# Patient Record
Sex: Female | Born: 1955 | Race: Black or African American | Hispanic: No | Marital: Single | Smoking: Never smoker
Health system: Southern US, Community
[De-identification: ages and names within clinical notes are randomized; demographics above are authoritative.]

## PROBLEM LIST (undated history)

## (undated) ENCOUNTER — Emergency Department (HOSPITAL_COMMUNITY): Admission: EM | Payer: Federal, State, Local not specified - PPO | Source: Home / Self Care

## (undated) DIAGNOSIS — J45909 Unspecified asthma, uncomplicated: Secondary | ICD-10-CM

## (undated) DIAGNOSIS — K219 Gastro-esophageal reflux disease without esophagitis: Secondary | ICD-10-CM

## (undated) DIAGNOSIS — E7800 Pure hypercholesterolemia, unspecified: Secondary | ICD-10-CM

## (undated) DIAGNOSIS — I10 Essential (primary) hypertension: Secondary | ICD-10-CM

## (undated) DIAGNOSIS — E785 Hyperlipidemia, unspecified: Secondary | ICD-10-CM

## (undated) DIAGNOSIS — M810 Age-related osteoporosis without current pathological fracture: Secondary | ICD-10-CM

## (undated) HISTORY — PX: ABDOMINAL HYSTERECTOMY: SHX81

## (undated) HISTORY — DX: Age-related osteoporosis without current pathological fracture: M81.0

## (undated) HISTORY — DX: Hyperlipidemia, unspecified: E78.5

## (undated) HISTORY — DX: Essential (primary) hypertension: I10

## (undated) HISTORY — DX: Gastro-esophageal reflux disease without esophagitis: K21.9

## (undated) HISTORY — PX: BREAST BIOPSY: SHX20

## (undated) NOTE — *Deleted (*Deleted)
Md on call paged and notified of pt c/o epigastric pain.

---
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
## 2012-05-25 HISTORY — PX: OTHER SURGICAL HISTORY: SHX169

## 2016-06-18 LAB — HM COLONOSCOPY

## 2017-03-08 LAB — HM MAMMOGRAPHY

## 2018-03-14 DIAGNOSIS — Z01419 Encounter for gynecological examination (general) (routine) without abnormal findings: Secondary | ICD-10-CM | POA: Diagnosis not present

## 2018-03-14 LAB — HM PAP SMEAR: HM Pap smear: NEGATIVE

## 2018-10-11 LAB — CBC AND DIFFERENTIAL
HCT: 35 — AB (ref 36–46)
Hemoglobin: 12.1 (ref 12.0–16.0)
Neutrophils Absolute: 2
Platelets: 327 (ref 150–399)
WBC: 4.1

## 2018-10-13 LAB — HEPATIC FUNCTION PANEL
ALT: 20 (ref 7–35)
AST: 20 (ref 13–35)
Alkaline Phosphatase: 39 (ref 25–125)
Bilirubin, Total: 0.5

## 2018-10-13 LAB — IFE AND PE, SERUM
Albumin: 4.3 (ref ?–4.4)
Alpha-1-Globulin: 0.2 (ref ?–0.4)
Alpha-2-Globulin: 0.7 (ref ?–1)
Beta Globulin: 0.9 (ref ?–1.3)
Gamma Globulin: 0.8 (ref ?–1.8)
Globulin, Total: 2.6 (ref 2.2–3.9)
IgA, Qn, Serum: 124 (ref ?–354)
IgG, Qn, Serum: 921 (ref ?–1602)
IgM, Qn, Serum: 91 (ref ?–217)
Immunofixation Interp.,Sr: NORMAL

## 2018-10-13 LAB — PTH, INTACT
PTH, Intact: 32 (ref 15–65)
Phosphorus: 3.3 (ref 3–4.3)

## 2018-10-13 LAB — IFE AND PE, RANDOM URINE
ALBUMIN, U: 37.1
ALPHA-2-GLOBULIN, U: 21
Alpha-1-Globulin, U: 5.3
Beta Globulin, U: 22.7
Gamma Globulin, U: 13.9
Immunofixation Result, Urine: NORMAL
Protein, Ur: 10.1

## 2018-10-13 LAB — BASIC METABOLIC PANEL
BUN: 17 (ref 4–21)
Creatinine: 1.1 (ref 0.5–1.1)
Glucose: 87
Potassium: 4.5 (ref 3.4–5.3)
Sodium: 139 (ref 137–147)

## 2019-01-27 DIAGNOSIS — H35413 Lattice degeneration of retina, bilateral: Secondary | ICD-10-CM | POA: Diagnosis not present

## 2019-01-27 DIAGNOSIS — H33313 Horseshoe tear of retina without detachment, bilateral: Secondary | ICD-10-CM | POA: Diagnosis not present

## 2019-01-27 DIAGNOSIS — H43393 Other vitreous opacities, bilateral: Secondary | ICD-10-CM | POA: Diagnosis not present

## 2019-01-27 DIAGNOSIS — H43813 Vitreous degeneration, bilateral: Secondary | ICD-10-CM | POA: Diagnosis not present

## 2019-02-07 DIAGNOSIS — H33311 Horseshoe tear of retina without detachment, right eye: Secondary | ICD-10-CM | POA: Diagnosis not present

## 2019-03-01 ENCOUNTER — Ambulatory Visit (INDEPENDENT_AMBULATORY_CARE_PROVIDER_SITE_OTHER): Payer: Federal, State, Local not specified - PPO | Admitting: Family Medicine

## 2019-03-01 ENCOUNTER — Encounter: Payer: Self-pay | Admitting: Family Medicine

## 2019-03-01 ENCOUNTER — Other Ambulatory Visit: Payer: Self-pay

## 2019-03-01 VITALS — BP 141/83 | HR 69 | Temp 99.0°F | Resp 16 | Ht 64.0 in | Wt 168.6 lb

## 2019-03-01 DIAGNOSIS — Z23 Encounter for immunization: Secondary | ICD-10-CM | POA: Diagnosis not present

## 2019-03-01 DIAGNOSIS — Z124 Encounter for screening for malignant neoplasm of cervix: Secondary | ICD-10-CM

## 2019-03-01 DIAGNOSIS — I10 Essential (primary) hypertension: Secondary | ICD-10-CM

## 2019-03-01 DIAGNOSIS — Z1231 Encounter for screening mammogram for malignant neoplasm of breast: Secondary | ICD-10-CM

## 2019-03-01 NOTE — Progress Notes (Signed)
Subjective:    Patient ID: Kendra Allison, female    DOB: [birth year], 63 y.o.   MRN: [id number]  Kendra Allison is a 63 y.o. female presenting on 03/01/2019 for New Patient (Initial Visit)  Moved from [place] 2 months ago, she retired early due to stress.  HPI   CHRONIC HTN: Reports history prior readings due to stress in past 160s with prior job. now improved and moved here retired. Home readings SBP 130s. Current Meds - Spironolactone 25mg  daily, Verapamil 240mg  CR - Previously followed by Cardiology asking about dosing, says she has side effects on Arlyce Harman would like to hold off of this, improved off of med. Reports good compliance, took meds today. Tolerating well, w/o complaints. Denies CP, dyspnea, HA, edema, dizziness / lightheadedness  Acid Reflux / Abdominal Discomfort Chronic problem. Not on PPI currently. Worse with side effect on Spiro.   Health Maintenance:  Request referral to GYN for pap smear. She previously followed by GYN - will request records.  Colon CA Screening - prior GI in [place]. Had Colonoscopy done 4-5 years ago approx, she will request records for [place]. She is considering Cologuard as alternative if due.  Breast CA Screening: Due for mammogram screening. Last mammogram result approx 1 year ago, request referral to GYN.    Depression screen PHQ 2/9 03/01/2019  Decreased Interest 0  Down, Depressed, Hopeless 0  PHQ - 2 Score 0  Altered sleeping 0  Tired, decreased energy 3  Change in appetite 0  Feeling bad or failure about yourself  0  Trouble concentrating 0  Moving slowly or fidgety/restless 0  Suicidal thoughts 0  PHQ-9 Score 3  Difficult doing work/chores Not difficult at all    Past Medical History:  Diagnosis Date  . GERD (gastroesophageal reflux disease)   . Hyperlipidemia   . Hypertension   . Osteoporosis    History reviewed. No pertinent surgical history. Social History   Socioeconomic History  . Marital status: Single   Spouse name: Not on file  . Number of children: Not on file  . Years of education: Not on file  . Highest education level: Not on file  Occupational History  . Not on file  Social Needs  . Financial resource strain: Not on file  . Food insecurity    Worry: Not on file    Inability: Not on file  . Transportation needs    Medical: Not on file    Non-medical: Not on file  Tobacco Use  . Smoking status: Never Smoker  . Smokeless tobacco: Never Used  Substance and Sexual Activity  . Alcohol use: Yes  . Drug use: Never  . Sexual activity: Not on file  Lifestyle  . Physical activity    Days per week: Not on file    Minutes per session: Not on file  . Stress: Not on file  Relationships  . Social Herbalist on phone: Not on file    Gets together: Not on file    Attends religious service: Not on file    Active member of club or organization: Not on file    Attends meetings of clubs or organizations: Not on file    Relationship status: Not on file  . Intimate partner violence    Fear of current or ex partner: Not on file    Emotionally abused: Not on file    Physically abused: Not on file    Forced sexual activity: Not on file  Other  Topics Concern  . Not on file  Social History Narrative  . Not on file   History reviewed. No pertinent family history. Current Outpatient Medications on File Prior to Visit  Medication Sig  . alendronate (FOSAMAX) 70 MG tablet Take 70 mg by mouth once a week. Take with a full glass of water on an empty stomach.  . Azelastine-Fluticasone 137-50 MCG/ACT SUSP Place into the nose.  . estradiol (ESTRACE) 2 MG tablet Take 2 mg by mouth daily.  . montelukast (SINGULAIR) 10 MG tablet Take 10 mg by mouth at bedtime.  . rosuvastatin (CRESTOR) 10 MG tablet Take 10 mg by mouth daily.  . verapamil (CALAN-SR) 240 MG CR tablet Take 240 mg by mouth at bedtime.   No current facility-administered medications on file prior to visit.     Review of  Systems Per HPI unless specifically indicated above       Objective:    BP (!) 141/83 (BP Location: Left Arm, Patient Position: Sitting, Cuff Size: Normal)   Pulse 69   Temp 99 F (37.2 C) (Oral)   Resp 16   Ht 5\' 4"  (1.626 m)   Wt 168 lb 9.6 oz (76.5 kg)   SpO2 100%   BMI 28.94 kg/m   Wt Readings from Last 3 Encounters:  03/01/19 168 lb 9.6 oz (76.5 kg)    Physical Exam Vitals signs and nursing note reviewed.  Constitutional:      General: She is not in acute distress.    Appearance: She is well-developed. She is not diaphoretic.     Comments: Well-appearing, comfortable, cooperative  HENT:     Head: Normocephalic and atraumatic.  Eyes:     General:        Right eye: No discharge.        Left eye: No discharge.     Conjunctiva/sclera: Conjunctivae normal.  Cardiovascular:     Rate and Rhythm: Normal rate.  Pulmonary:     Effort: Pulmonary effort is normal.  Skin:    General: Skin is warm and dry.     Findings: No erythema or rash.  Neurological:     Mental Status: She is alert and oriented to person, place, and time.  Psychiatric:        Behavior: Behavior normal.     Comments: Well groomed, good eye contact, normal speech and thoughts    No results found for this or any previous visit.    Assessment & Plan:   Problem List Items Addressed This Visit    Essential hypertension - Primary Mild elevated BP, improved Side effect on Spiro rx by prior Cardiology  May HOLD Spiro, since limited edema, was on for BP Failed prior BP meds - unsure list, request records Continue Verapamil for now Review prior record and consider future other BP med vs refer to Cards if indicated   Relevant Medications   verapamil (CALAN-SR) 240 MG CR tablet   rosuvastatin (CRESTOR) 10 MG tablet    Other Visit Diagnoses    Need for influenza vaccination       Relevant Orders   Flu Vaccine QUAD 36+ mos IM (Completed)   Cervical cancer screening       Relevant Orders    Ambulatory referral to Obstetrics / Gynecology   Encounter for screening mammogram for malignant neoplasm of breast       Relevant Orders   Ambulatory referral to Obstetrics / Gynecology      Referral to GYN [facility] vs [facility]  for further screening pap smear / mammogram.  #Establish Care - request all records from prior specialist.  No orders of the defined types were placed in this encounter.     Follow up plan: Return in about 3 weeks (around 03/22/2019) for Annual Physical.   Future labs 03/16/19 CMET , CBC, Lipid, A1c, TSH, Hep C,  Nobie Putnam, DO [facility] 03/01/2019, 10:23 AM

## 2019-03-01 NOTE — Patient Instructions (Addendum)
Thank you for coming to the office today.  Try to locate all of your previous health records, mainly need any lab tests, Mammogram, Colonoscopy, Pap Smear testing - and we can review that, drop it off and we can make copies.  Referral to GYN women's health - one of these locations will call you to schedule a new patient appointment for pap smear and mammogram and screening evaluation.  [facility] [address] [place], [place] [address] Hours: [place] Main: [place]   Address: [address] [place], [place], [place], [place] [address] Hours: 8AM-5PM Phone: [phone]  For Mammogram screening for breast cancer   ---------------------------------------------------------------------  AFTER you talk to GYN then you can call the [place] below anytime  [facility] [place],  [address] Phone: [phone]  -------------------------------------  Discontinue the Spironolactone for now - keep track of blood pressure and swelling, we can discuss alternative meds at next visit once I review your records.    DUE for FASTING BLOOD WORK (no food or drink after midnight before the lab appointment, only water or coffee without cream/sugar on the morning of)  SCHEDULE "Lab Only" visit in the morning at the clinic for lab draw in 2 WEEKS   - Make sure Lab Only appointment is at about 1 week before your next appointment, so that results will be available  For Lab Results, once available within 2-3 days of blood draw, you can can log in to MyChart online to view your results and a brief explanation. Also, we can discuss results at next follow-up visit.   Please schedule a Follow-up Appointment to: Return in about 3 weeks (around 03/22/2019) for Annual Physical.  If you have any other questions or concerns, please feel free to call the office or send  a message through [place]. You may also schedule an earlier appointment if necessary.  Additionally, you may be receiving a survey about your experience at our office within a few days to 1 week by e-mail or mail. We value your feedback.  Nobie Putnam, DO [place]

## 2019-03-02 ENCOUNTER — Other Ambulatory Visit: Payer: Self-pay | Admitting: Family Medicine

## 2019-03-02 DIAGNOSIS — I10 Essential (primary) hypertension: Secondary | ICD-10-CM

## 2019-03-02 DIAGNOSIS — Z0000 Encounter for general adult medical examination without abnormal findings: Secondary | ICD-10-CM

## 2019-03-02 DIAGNOSIS — Z1159 Encounter for screening for other viral diseases: Secondary | ICD-10-CM

## 2019-03-08 DIAGNOSIS — H33313 Horseshoe tear of retina without detachment, bilateral: Secondary | ICD-10-CM | POA: Diagnosis not present

## 2019-03-09 ENCOUNTER — Encounter: Payer: Self-pay | Admitting: Family Medicine

## 2019-03-09 DIAGNOSIS — I517 Cardiomegaly: Secondary | ICD-10-CM | POA: Insufficient documentation

## 2019-03-09 HISTORY — DX: Cardiomegaly: I51.7

## 2019-03-16 ENCOUNTER — Other Ambulatory Visit: Payer: Self-pay

## 2019-03-16 DIAGNOSIS — I10 Essential (primary) hypertension: Secondary | ICD-10-CM | POA: Diagnosis not present

## 2019-03-16 DIAGNOSIS — Z1159 Encounter for screening for other viral diseases: Secondary | ICD-10-CM | POA: Diagnosis not present

## 2019-03-16 DIAGNOSIS — E785 Hyperlipidemia, unspecified: Secondary | ICD-10-CM | POA: Diagnosis not present

## 2019-03-16 DIAGNOSIS — Z0000 Encounter for general adult medical examination without abnormal findings: Secondary | ICD-10-CM | POA: Diagnosis not present

## 2019-03-20 LAB — COMPLETE METABOLIC PANEL WITH GFR
AG Ratio: 2 (calc) (ref 1.0–2.5)
ALT: 13 U/L (ref 6–29)
AST: 19 U/L (ref 10–35)
Albumin: 4.6 g/dL (ref 3.6–5.1)
Alkaline phosphatase (APISO): 34 U/L — ABNORMAL LOW (ref 37–153)
BUN: 21 mg/dL (ref 7–25)
CO2: 27 mmol/L (ref 20–32)
Calcium: 10.1 mg/dL (ref 8.6–10.4)
Chloride: 105 mmol/L (ref 98–110)
Creat: 0.96 mg/dL (ref 0.50–0.99)
GFR, Est African American: 73 mL/min/{1.73_m2} (ref >=60)
GFR, Est Non African American: 63 mL/min/{1.73_m2} (ref >=60)
Globulin: 2.3 g/dL (calc) (ref 1.9–3.7)
Glucose, Bld: 74 mg/dL (ref 65–99)
Potassium: 4.2 mmol/L (ref 3.5–5.3)
Sodium: 142 mmol/L (ref 135–146)
Total Bilirubin: 0.6 mg/dL (ref 0.2–1.2)
Total Protein: 6.9 g/dL (ref 6.1–8.1)

## 2019-03-20 LAB — LIPID PANEL
Cholesterol: 131 mg/dL (ref <200)
HDL: 64 mg/dL (ref >=50)
LDL Cholesterol (Calc): 54 mg/dL (calc)
Non-HDL Cholesterol (Calc): 67 mg/dL (calc) (ref <130)
Total CHOL/HDL Ratio: 2 (calc) (ref <5.0)
Triglycerides: 47 mg/dL (ref <150)

## 2019-03-20 LAB — CBC WITH DIFFERENTIAL/PLATELET
Absolute Monocytes: 338 cells/uL (ref 200–950)
Basophils Absolute: 49 cells/uL (ref 0–200)
Basophils Relative: 1.3 %
Eosinophils Absolute: 99 cells/uL (ref 15–500)
Eosinophils Relative: 2.6 %
HCT: 35.5 % (ref 35.0–45.0)
Hemoglobin: 11.7 g/dL (ref 11.7–15.5)
Lymphs Abs: 1493 cells/uL (ref 850–3900)
MCH: 30.5 pg (ref 27.0–33.0)
MCHC: 33 g/dL (ref 32.0–36.0)
MCV: 92.4 fL (ref 80.0–100.0)
MPV: 11.2 fL (ref 7.5–12.5)
Monocytes Relative: 8.9 %
Neutro Abs: 1820 cells/uL (ref 1500–7800)
Neutrophils Relative %: 47.9 %
Platelets: 282 10*3/uL (ref 140–400)
RBC: 3.84 10*6/uL (ref 3.80–5.10)
RDW: 12 % (ref 11.0–15.0)
Total Lymphocyte: 39.3 %
WBC: 3.8 10*3/uL (ref 3.8–10.8)

## 2019-03-20 LAB — HEPATITIS C ANTIBODY
Hepatitis C Ab: NONREACTIVE
SIGNAL TO CUT-OFF: 0 (ref <1.00)

## 2019-03-20 LAB — HEMOGLOBIN A1C
Hgb A1c MFr Bld: 5 % of total Hgb (ref <5.7)
Mean Plasma Glucose: 97 (calc)
eAG (mmol/L): 5.4 (calc)

## 2019-03-20 LAB — TSH: TSH: 2.51 mIU/L (ref 0.40–4.50)

## 2019-03-23 ENCOUNTER — Encounter: Payer: Self-pay | Admitting: Family Medicine

## 2019-03-23 ENCOUNTER — Ambulatory Visit (INDEPENDENT_AMBULATORY_CARE_PROVIDER_SITE_OTHER): Payer: Federal, State, Local not specified - PPO | Admitting: Family Medicine

## 2019-03-23 ENCOUNTER — Other Ambulatory Visit: Payer: Self-pay

## 2019-03-23 VITALS — BP 149/83 | HR 75 | Temp 99.3°F | Resp 16 | Ht 64.0 in | Wt 174.0 lb

## 2019-03-23 DIAGNOSIS — Z0000 Encounter for general adult medical examination without abnormal findings: Secondary | ICD-10-CM

## 2019-03-23 DIAGNOSIS — E7800 Pure hypercholesterolemia, unspecified: Secondary | ICD-10-CM

## 2019-03-23 DIAGNOSIS — E785 Hyperlipidemia, unspecified: Secondary | ICD-10-CM | POA: Insufficient documentation

## 2019-03-23 DIAGNOSIS — E782 Mixed hyperlipidemia: Secondary | ICD-10-CM | POA: Diagnosis not present

## 2019-03-23 DIAGNOSIS — R140 Abdominal distension (gaseous): Secondary | ICD-10-CM | POA: Diagnosis not present

## 2019-03-23 DIAGNOSIS — I10 Essential (primary) hypertension: Secondary | ICD-10-CM

## 2019-03-23 MED ORDER — ROSUVASTATIN CALCIUM 10 MG PO TABS: 10.0000 mg | ORAL_TABLET | Freq: Every day | ORAL | 3 refills | Status: DC

## 2019-03-23 MED ORDER — LOSARTAN POTASSIUM 50 MG PO TABS: 50.0000 mg | ORAL_TABLET | Freq: Every day | ORAL | 2 refills | Status: DC

## 2019-03-23 NOTE — Assessment & Plan Note (Signed)
Controlled cholesterol on statin and lifestyle Last lipid panel 02/2019  Plan: 1. Continue current meds - Rosuvastatin 10mg  daily refilled today 2. Encourage improved lifestyle - low carb/cholesterol, reduce portion size, continue improving regular exercise

## 2019-03-23 NOTE — Assessment & Plan Note (Addendum)
Mildly elevated initial BP, repeat manual check improved but still above goal SBP >140 - Home BP readings 0000000  No known complications    Plan:  1. ADD new med Losartan 50mg  daily - counseling on new start ACEi/ARB potential side effect risk, benefits, can repeat lab in future as anticipated 2. Continue Verapamil 240mg  daily 3. Encourage improved lifestyle - low sodium diet, regular exercise 4. Continue monitor BP outside office, bring readings to next visit, if persistently >140/90 or new symptoms notify office sooner  F/u 6 months, sooner if needed

## 2019-03-23 NOTE — Progress Notes (Addendum)
Subjective:    Patient ID: Kendra Allison, female    DOB: 03/27/1956, 63 y.o.   MRN: [id number]  Kendra Allison is a 63 y.o. female presenting on 03/23/2019 for Annual Exam   HPI   Here for Annual Physical and Lab Review.   CHRONIC HTN: Last visit we discontinued Spironolactone 25mg  due to some side effect. SHe has monitored home BP 140s on average. She did eat pork in AM and it raised her BP. Current Meds - Verapamil 240mg  CR Reports good compliance, took meds today. Tolerating well, w/o complaints. Denies CP, dyspnea, HA, edema, dizziness / lightheadedness  HYPERLIPIDEMIA: - Reports no concerns. Last lipid panel 02/2019, controlled  - Currently taking Rosuvastatin 10mg , tolerating well without side effects or myalgias Needs refill   Health Maintenance:  Request referral to GYN for pap smear. She previously followed by GYN - Last pap 2019. See results. She has apt with [facility]   Colon CA Screening - prior GI in [place]. Had Colonoscopy prior 2018, next due 10 years.  Breast CA Screening: Last mammogram 03/08/2017 - due for repeat, will go to GYN next.  Depression screen [facility] 2/9 03/23/2019 03/01/2019  Decreased Interest 0 0  Down, Depressed, Hopeless 0 0  PHQ - 2 Score 0 0  Altered sleeping 0 0  Tired, decreased energy 0 3  Change in appetite 0 0  Feeling bad or failure about yourself  0 0  Trouble concentrating 0 0  Moving slowly or fidgety/restless 0 0  Suicidal thoughts 0 0  PHQ-9 Score 0 3  Difficult doing work/chores Not difficult at all Not difficult at all    Past Medical History:  Diagnosis Date  . GERD (gastroesophageal reflux disease)   . Hyperlipidemia   . Hypertension   . Osteoporosis    History reviewed. No pertinent surgical history. Social History   Socioeconomic History  . Marital status: Single    Spouse name: Not on file  . Number of children: Not on file  . Years of education: Not on file  . Highest education level: Not on file   Occupational History  . Not on file  Social Needs  . Financial resource strain: Not on file  . Food insecurity    Worry: Not on file    Inability: Not on file  . Transportation needs    Medical: Not on file    Non-medical: Not on file  Tobacco Use  . Smoking status: Never Smoker  . Smokeless tobacco: Never Used  Substance and Sexual Activity  . Alcohol use: Yes  . Drug use: Never  . Sexual activity: Not on file  Lifestyle  . Physical activity    Days per week: Not on file    Minutes per session: Not on file  . Stress: Not on file  Relationships  . Social Herbalist on phone: Not on file    Gets together: Not on file    Attends religious service: Not on file    Active member of club or organization: Not on file    Attends meetings of clubs or organizations: Not on file    Relationship status: Not on file  . Intimate partner violence    Fear of current or ex partner: Not on file    Emotionally abused: Not on file    Physically abused: Not on file    Forced sexual activity: Not on file  Other Topics Concern  . Not on file  Social History Narrative  .  Not on file   History reviewed. No pertinent family history. Current Outpatient Medications on File Prior to Visit  Medication Sig  . alendronate (FOSAMAX) 70 MG tablet Take 70 mg by mouth once a week. Take with a full glass of water on an empty stomach.  . Azelastine-Fluticasone 137-50 MCG/ACT SUSP Place into the nose.  . estradiol (ESTRACE) 2 MG tablet Take 2 mg by mouth daily.  . montelukast (SINGULAIR) 10 MG tablet Take 10 mg by mouth at bedtime.  . verapamil (CALAN-SR) 240 MG CR tablet Take 240 mg by mouth at bedtime.   No current facility-administered medications on file prior to visit.     Review of Systems  Constitutional: Negative for activity change, appetite change, chills, diaphoresis, fatigue and fever.  HENT: Negative for congestion and hearing loss.   Eyes: Negative for visual disturbance.   Respiratory: Negative for apnea, cough, chest tightness, shortness of breath and wheezing.   Cardiovascular: Negative for chest pain, palpitations and leg swelling.  Gastrointestinal: Negative for abdominal pain, anal bleeding, blood in stool, constipation, diarrhea, nausea and vomiting.  Endocrine: Negative for cold intolerance.  Genitourinary: Negative for difficulty urinating, dysuria, frequency and hematuria.  Musculoskeletal: Negative for arthralgias, back pain and neck pain.  Skin: Negative for rash.  Allergic/Immunologic: Negative for environmental allergies.  Neurological: Negative for dizziness, weakness, light-headedness, numbness and headaches.  Hematological: Negative for adenopathy.  Psychiatric/Behavioral: Negative for behavioral problems, dysphoric mood and sleep disturbance. The patient is not nervous/anxious.    Per HPI unless specifically indicated above      Objective:    BP (!) 149/83   Pulse 75   Temp 99.3 F (37.4 C) (Oral)   Resp 16   Ht 5\' 4"  (1.626 m)   Wt 174 lb (78.9 kg)   BMI 29.87 kg/m   Wt Readings from Last 3 Encounters:  03/23/19 174 lb (78.9 kg)  03/01/19 168 lb 9.6 oz (76.5 kg)    Physical Exam Vitals signs and nursing note reviewed.  Constitutional:      General: She is not in acute distress.    Appearance: She is well-developed. She is not diaphoretic.     Comments: Well-appearing, comfortable, cooperative  HENT:     Head: Normocephalic and atraumatic.  Eyes:     General:        Right eye: No discharge.        Left eye: No discharge.     Conjunctiva/sclera: Conjunctivae normal.     Pupils: Pupils are equal, round, and reactive to light.  Neck:     Musculoskeletal: Normal range of motion and neck supple.     Thyroid: No thyromegaly.  Cardiovascular:     Rate and Rhythm: Normal rate and regular rhythm.     Heart sounds: Normal heart sounds. No murmur.  Pulmonary:     Effort: Pulmonary effort is normal. No respiratory distress.      Breath sounds: Normal breath sounds. No wheezing or rales.  Abdominal:     General: Bowel sounds are normal. There is no distension.     Palpations: Abdomen is soft. There is no mass.     Tenderness: There is no abdominal tenderness.  Musculoskeletal: Normal range of motion.        General: No tenderness.     Comments: Upper / Lower Extremities: - Normal muscle tone, strength bilateral upper extremities 5/5, lower extremities 5/5  Lymphadenopathy:     Cervical: No cervical adenopathy.  Skin:    General:  Skin is warm and dry.     Findings: No erythema or rash.  Neurological:     Mental Status: She is alert and oriented to person, place, and time.     Comments: Distal sensation intact to light touch all extremities  Psychiatric:        Behavior: Behavior normal.     Comments: Well groomed, good eye contact, normal speech and thoughts        Results for orders placed or performed in visit on 03/16/19  Hepatitis C antibody  Result Value Ref Range   Hepatitis C Ab NON-REACTIVE NON-REACTI   SIGNAL TO CUT-OFF 0.00 <1.00  TSH  Result Value Ref Range   TSH 2.51 0.40 - 4.50 mIU/L  Lipid panel  Result Value Ref Range   Cholesterol 131 <200 mg/dL   HDL 64 > OR = 50 mg/dL   Triglycerides 47 <150 mg/dL   LDL Cholesterol (Calc) 54 mg/dL (calc)   Total CHOL/HDL Ratio 2.0 <5.0 (calc)   Non-HDL Cholesterol (Calc) 67 <130 mg/dL (calc)  COMPLETE METABOLIC PANEL WITH GFR  Result Value Ref Range   Glucose, Bld 74 65 - 99 mg/dL   BUN 21 7 - 25 mg/dL   Creat 0.96 0.50 - 0.99 mg/dL   GFR, Est Non African American 63 > OR = 60 mL/min/1.15m2   GFR, Est African American 73 > OR = 60 mL/min/1.86m2   BUN/Creatinine Ratio NOT APPLICABLE 6 - 22 (calc)   Sodium 142 135 - 146 mmol/L   Potassium 4.2 3.5 - 5.3 mmol/L   Chloride 105 98 - 110 mmol/L   CO2 27 20 - 32 mmol/L   Calcium 10.1 8.6 - 10.4 mg/dL   Total Protein 6.9 6.1 - 8.1 g/dL   Albumin 4.6 3.6 - 5.1 g/dL   Globulin 2.3 1.9 - 3.7 g/dL  (calc)   AG Ratio 2.0 1.0 - 2.5 (calc)   Total Bilirubin 0.6 0.2 - 1.2 mg/dL   Alkaline phosphatase (APISO) 34 (L) 37 - 153 U/L   AST 19 10 - 35 U/L   ALT 13 6 - 29 U/L  CBC with Differential/Platelet  Result Value Ref Range   WBC 3.8 3.8 - 10.8 Thousand/uL   RBC 3.84 3.80 - 5.10 Million/uL   Hemoglobin 11.7 11.7 - 15.5 g/dL   HCT 35.5 35.0 - 45.0 %   MCV 92.4 80.0 - 100.0 fL   MCH 30.5 27.0 - 33.0 pg   MCHC 33.0 32.0 - 36.0 g/dL   RDW 12.0 11.0 - 15.0 %   Platelets 282 140 - 400 Thousand/uL   MPV 11.2 7.5 - 12.5 fL   Neutro Abs 1,820 1,500 - 7,800 cells/uL   Lymphs Abs 1,493 850 - 3,900 cells/uL   Absolute Monocytes 338 200 - 950 cells/uL   Eosinophils Absolute 99 15 - 500 cells/uL   Basophils Absolute 49 0 - 200 cells/uL   Neutrophils Relative % 47.9 %   Total Lymphocyte 39.3 %   Monocytes Relative 8.9 %   Eosinophils Relative 2.6 %   Basophils Relative 1.3 %  Hemoglobin A1c  Result Value Ref Range   Hgb A1c MFr Bld 5.0 <5.7 % of total Hgb   Mean Plasma Glucose 97 (calc)   eAG (mmol/L) 5.4 (calc)      Assessment & Plan:   Problem List Items Addressed This Visit    Hyperlipidemia    Controlled cholesterol on statin and lifestyle Last lipid panel 02/2019  Plan: 1. Continue current meds -  Rosuvastatin 10mg  daily refilled today 2. Encourage improved lifestyle - low carb/cholesterol, reduce portion size, continue improving regular exercise      Relevant Medications   rosuvastatin (CRESTOR) 10 MG tablet   losartan (COZAAR) 50 MG tablet   Essential hypertension    Mildly elevated initial BP, repeat manual check improved but still above goal SBP >140 - Home BP readings 0000000  No known complications    Plan:  1. ADD new med Losartan 50mg  daily - counseling on new start ACEi/ARB potential side effect risk, benefits, can repeat lab in future as anticipated 2. Continue Verapamil 240mg  daily 3. Encourage improved lifestyle - low sodium diet, regular exercise 4. Continue  monitor BP outside office, bring readings to next visit, if persistently >140/90 or new symptoms notify office sooner  F/u 6 months, sooner if needed      Relevant Medications   rosuvastatin (CRESTOR) 10 MG tablet   losartan (COZAAR) 50 MG tablet    Other Visit Diagnoses    Annual physical exam    -  Primary   Functional bloating        Reassuring history Diet modification, avoid trigger foods Trial on probiotic, sample given   Updated Health Maintenance information Reviewed recent lab results with patient Encouraged improvement to lifestyle with diet and exercise - Goal of weight loss   Meds ordered this encounter  Medications  . rosuvastatin (CRESTOR) 10 MG tablet    Sig: Take 1 tablet (10 mg total) by mouth daily.    Dispense:  90 tablet    Refill:  3  . losartan (COZAAR) 50 MG tablet    Sig: Take 1 tablet (50 mg total) by mouth daily.    Dispense:  30 tablet    Refill:  2     Follow up plan: Return in about 6 months (around 09/21/2019) for 6 month follow-up HTN.  Nobie Putnam, DO [facility] 03/23/2019, 10:01 AM

## 2019-03-23 NOTE — Patient Instructions (Addendum)
Thank you for coming to the office today.  03/30/2019 10:00 AM - [facility] - ask about Mammogram.  All blood work is excellent.   1. Chemistry - Normal results, including electrolytes, kidney and liver function. Normal fasting blood sugar   2. Hemoglobin A1c (Diabetes screening) - 5.0, normal not in range of Pre-Diabetes (>5.7 to 6.4)   3. TSH Thyroid Function Tests - Normal.   4. Cholesterol - Normal cholesterol results.   5. CBC Blood Counts - Normal, no anemia, other abnormality   Start Losartan 50mg  daily for BP - caution future risk of facial lip swelling if you get this, stop medicine and call.  Call if need to adjust dose or refill  Please schedule a Follow-up Appointment to: Return in about 6 months (around 09/21/2019) for 6 month follow-up HTN.  If you have any other questions or concerns, please feel free to call the office or send a message through [place]. You may also schedule an earlier appointment if necessary.  Additionally, you may be receiving a survey about your experience at our office within a few days to 1 week by e-mail or mail. We value your feedback.  Nobie Putnam, DO [place]

## 2019-03-24 ENCOUNTER — Encounter: Payer: Federal, State, Local not specified - PPO | Admitting: Certified Nurse Midwife

## 2019-03-30 ENCOUNTER — Encounter: Payer: Federal, State, Local not specified - PPO | Admitting: Certified Nurse Midwife

## 2019-05-10 ENCOUNTER — Telehealth: Payer: Self-pay

## 2019-05-10 ENCOUNTER — Other Ambulatory Visit: Payer: Self-pay

## 2019-05-10 ENCOUNTER — Encounter: Payer: Self-pay | Admitting: Obstetrics and Gynecology

## 2019-05-10 ENCOUNTER — Ambulatory Visit (INDEPENDENT_AMBULATORY_CARE_PROVIDER_SITE_OTHER): Payer: Federal, State, Local not specified - PPO | Admitting: Obstetrics and Gynecology

## 2019-05-10 VITALS — BP 186/109 | HR 73 | Ht 65.0 in | Wt 176.1 lb

## 2019-05-10 DIAGNOSIS — Z79890 Hormone replacement therapy (postmenopausal): Secondary | ICD-10-CM

## 2019-05-10 DIAGNOSIS — Z8739 Personal history of other diseases of the musculoskeletal system and connective tissue: Secondary | ICD-10-CM

## 2019-05-10 DIAGNOSIS — Z1231 Encounter for screening mammogram for malignant neoplasm of breast: Secondary | ICD-10-CM

## 2019-05-10 DIAGNOSIS — N898 Other specified noninflammatory disorders of vagina: Secondary | ICD-10-CM | POA: Diagnosis not present

## 2019-05-10 DIAGNOSIS — Z01419 Encounter for gynecological examination (general) (routine) without abnormal findings: Secondary | ICD-10-CM

## 2019-05-10 MED ORDER — ESTRADIOL 2 MG PO TABS: 2.0000 mg | ORAL_TABLET | Freq: Every day | ORAL | 3 refills | Status: DC

## 2019-05-10 MED ORDER — ALENDRONATE SODIUM 70 MG PO TABS: 70.0000 mg | ORAL_TABLET | ORAL | 3 refills | Status: DC

## 2019-05-10 NOTE — Progress Notes (Signed)
Patient comes in today as a new patient appointment. She is coming in for Pap and mammogram. Patient states that she is having some vaginal itching.

## 2019-05-10 NOTE — Progress Notes (Signed)
HPI:      [name] is a 63 y.o. No obstetric history on file. who LMP was No LMP recorded. Patient has had a hysterectomy.  Subjective:   She presents today for her annual examination.  She states that she recently had a yeast infection and she self medicated with Monistat as usual.  She is "not sure if it has gone away".  She would like to be checked today. Patient has been getting regular Pap smears and mammograms. Of significant note-patient has had a hysterectomy for bleeding issues in "1979".  She says that she had both of her ovaries removed at that time as well. Patient takes estrogen daily. She has a history of osteopenia and takes alendronate.  She is not currently using calcium or vitamin D.    Hx: The following portions of the patient's history were reviewed and updated as appropriate:             She  has a past medical history of GERD (gastroesophageal reflux disease), Hyperlipidemia, Hypertension, and Osteoporosis. She does not have any pertinent problems on file. She  has no past surgical history on file. Her family history is not on file. She  reports that she has quit smoking. Her smoking use included cigarettes. She has never used smokeless tobacco. She reports current alcohol use. She reports that she does not use drugs. She has a current medication list which includes the following prescription(s): azelastine-fluticasone, losartan, montelukast, rosuvastatin, verapamil, alendronate, and estradiol. She has No Known Allergies.       Review of Systems:  Review of Systems  Constitutional: Denied constitutional symptoms, night sweats, recent illness, fatigue, fever, insomnia and weight loss.  Eyes: Denied eye symptoms, eye pain, photophobia, vision change and visual disturbance.  Ears/Nose/Throat/Neck: Denied ear, nose, throat or neck symptoms, hearing loss, nasal discharge, sinus congestion and sore throat.  Cardiovascular: Denied cardiovascular symptoms,  arrhythmia, chest pain/pressure, edema, exercise intolerance, orthopnea and palpitations.  Respiratory: Denied pulmonary symptoms, asthma, pleuritic pain, productive sputum, cough, dyspnea and wheezing.  Gastrointestinal: Denied, gastro-esophageal reflux, melena, nausea and vomiting.  Genitourinary: See HPI for additional information.  Musculoskeletal: Denied musculoskeletal symptoms, stiffness, swelling, muscle weakness and myalgia.  Dermatologic: Denied dermatology symptoms, rash and scar.  Neurologic: Denied neurology symptoms, dizziness, headache, neck pain and syncope.  Psychiatric: Denied psychiatric symptoms, anxiety and depression.  Endocrine: Denied endocrine symptoms including hot flashes and night sweats.   Meds:   Current Outpatient Medications on File Prior to Visit  Medication Sig Dispense Refill  . Azelastine-Fluticasone 137-50 MCG/ACT SUSP Place into the nose.    . losartan (COZAAR) 50 MG tablet Take 1 tablet (50 mg total) by mouth daily. 30 tablet 2  . montelukast (SINGULAIR) 10 MG tablet Take 10 mg by mouth at bedtime.    . rosuvastatin (CRESTOR) 10 MG tablet Take 1 tablet (10 mg total) by mouth daily. 90 tablet 3  . verapamil (CALAN-SR) 240 MG CR tablet Take 240 mg by mouth at bedtime.     No current facility-administered medications on file prior to visit.    Objective:     Vitals:   05/10/19 0915  BP: (!) 186/109  Pulse: 73              Physical examination General NAD, Conversant  HEENT Atraumatic; Op clear with mmm.  Normo-cephalic. Pupils reactive. Anicteric sclerae  Thyroid/Neck Smooth without nodularity or enlargement. Normal ROM.  Neck Supple.  Skin No rashes, lesions or ulceration. Normal palpated skin  turgor. No nodularity.  Breasts: No masses or discharge.  Symmetric.  No axillary adenopathy.  Lungs: Clear to auscultation.No rales or wheezes. Normal Respiratory effort, no retractions.  Heart: NSR.  No murmurs or rubs appreciated. No periferal edema   Abdomen: Soft.  Non-tender.  No masses.  No HSM. No hernia  Extremities: Moves all appropriately.  Normal ROM for age. No lymphadenopathy.  Neuro: Oriented to PPT.  Normal mood. Normal affect.     Pelvic:   Vulva: Normal appearance.  No lesions.   Vagina: No lesions or abnormalities noted.  Support: Normal pelvic support.  Urethra No masses tenderness or scarring.  Meatus Normal size without lesions or prolapse.  Cervix: Surgically absent   Anus: Normal exam.  No lesions.  Perineum: Normal exam.  No lesions.        Bimanual   Uterus: Surgically absent   Adnexae: No masses.  Non-tender to palpation.  Cul-de-sac: Negative for abnormality.   WET PREP: clue cells: absent, KOH (yeast): negative, odor: absent and trichomoniasis: negative Ph:  < 4.5   Assessment:    No obstetric history on file. Patient Active Problem List   Diagnosis Date Noted  . Hyperlipidemia   . Mild concentric left ventricular hypertrophy (LVH) 03/09/2019  . Essential hypertension 03/01/2019     1. Well woman exam with routine gynecological exam   2. Vaginal discharge   3. History of osteopenia   4. Hormone replacement therapy (HRT)     No evidence of current vaginitis based on wet prep.  Patient surprised to learn that she does not have a cervix, which is not surprising to me status post hysterectomy.  History of osteopenia-patient states she had a DEXA scan 2 years ago.   Plan:            1.  Basic Screening Recommendations The basic screening recommendations for asymptomatic women were discussed with the patient during her visit.  The age-appropriate recommendations were discussed with her and the rational for the tests reviewed.  When I am informed by the patient that another primary care physician has previously obtained the age-appropriate tests and they are up-to-date, only outstanding tests are ordered and referrals given as necessary.  Abnormal results of tests will be discussed with her when  all of her results are completed.  Routine preventative health maintenance measures emphasized: Exercise/Diet/Weight control, Tobacco Warnings, Alcohol/Substance use risks and Stress Management Mammogram ordered. Recommend DEXA scan next year for follow-up of osteopenia 2.  Continue Estrace and alendronate 3.  Patient to contact us if she continues to experience symptomatic vaginal discharge. Orders No orders of the defined types were placed in this encounter.    Meds ordered this encounter  Medications  . alendronate (FOSAMAX) 70 MG tablet    Sig: Take 1 tablet (70 mg total) by mouth once a week. Take with a full glass of water on an empty stomach.    Dispense:  12 tablet    Refill:  3  . estradiol (ESTRACE) 2 MG tablet    Sig: Take 1 tablet (2 mg total) by mouth daily.    Dispense:  90 tablet    Refill:  3        F/U  Return in about 1 year (around 05/09/2020) for Annual Physical.  Finis Bud, M.D. 05/10/2019 10:25 AM

## 2019-05-10 NOTE — Telephone Encounter (Signed)
Pt request confirmation mammogram is ordered. Informed pt allow two days for order to be received at [facility].

## 2019-05-11 NOTE — Addendum Note (Signed)
Addended by: Durwin Glaze on: 05/11/2019 08:26 AM   Modules accepted: Orders

## 2019-05-11 NOTE — Telephone Encounter (Signed)
Order placed

## 2019-05-17 ENCOUNTER — Ambulatory Visit
Admission: RE | Admit: 2019-05-17 | Discharge: 2019-05-17 | Disposition: A | Discharge status: Home / Self Care | Payer: Federal, State, Local not specified - PPO | Source: Ambulatory Visit | Attending: Obstetrics and Gynecology | Admitting: Obstetrics and Gynecology

## 2019-05-17 DIAGNOSIS — Z1231 Encounter for screening mammogram for malignant neoplasm of breast: Secondary | ICD-10-CM | POA: Diagnosis not present

## 2019-05-22 ENCOUNTER — Other Ambulatory Visit: Payer: Self-pay | Admitting: Family Medicine

## 2019-05-22 DIAGNOSIS — I10 Essential (primary) hypertension: Secondary | ICD-10-CM

## 2019-05-22 MED ORDER — LOSARTAN POTASSIUM 50 MG PO TABS: 50.0000 mg | ORAL_TABLET | Freq: Every day | ORAL | 1 refills | Status: DC

## 2019-05-22 NOTE — Telephone Encounter (Signed)
Pt.called requesting refill on Losartan 50 MG called into  [facility] 90 day  Supply. Pt call back # [phone]

## 2019-06-12 ENCOUNTER — Other Ambulatory Visit: Payer: Self-pay

## 2019-06-12 ENCOUNTER — Encounter: Payer: Self-pay | Admitting: Family Medicine

## 2019-06-12 ENCOUNTER — Ambulatory Visit (INDEPENDENT_AMBULATORY_CARE_PROVIDER_SITE_OTHER): Payer: Federal, State, Local not specified - PPO | Admitting: Family Medicine

## 2019-06-12 DIAGNOSIS — R1013 Epigastric pain: Secondary | ICD-10-CM

## 2019-06-12 DIAGNOSIS — A048 Other specified bacterial intestinal infections: Secondary | ICD-10-CM

## 2019-06-12 DIAGNOSIS — R140 Abdominal distension (gaseous): Secondary | ICD-10-CM

## 2019-06-12 DIAGNOSIS — K219 Gastro-esophageal reflux disease without esophagitis: Secondary | ICD-10-CM

## 2019-06-12 MED ORDER — CLARITHROMYCIN 500 MG PO TABS: 500.0000 mg | ORAL_TABLET | Freq: Two times a day (BID) | ORAL | 0 refills | Status: DC

## 2019-06-12 MED ORDER — METRONIDAZOLE 500 MG PO TABS: 500.0000 mg | ORAL_TABLET | Freq: Three times a day (TID) | ORAL | 0 refills | Status: DC

## 2019-06-12 MED ORDER — OMEPRAZOLE 40 MG PO CPDR: DELAYED_RELEASE_CAPSULE | ORAL | 0 refills | Status: DC

## 2019-06-12 NOTE — Progress Notes (Signed)
Virtual Visit via Telephone The purpose of this virtual visit is to provide medical care while limiting exposure to the novel coronavirus (COVID19) for both patient and office staff.  Consent was obtained for phone visit:  Yes.   Answered questions that patient had about telehealth interaction:  Yes.   I discussed the limitations, risks, security and privacy concerns of performing an evaluation and management service by telephone. I also discussed with the patient that there may be a patient responsible charge related to this service. The patient expressed understanding and agreed to proceed.  Patient Location: Home Provider Location: [facility])  ---------------------------------------------------------------------- Chief Complaint  Patient presents with  . Abdominal Pain    abdominal pain happens after swallowing and the pain pushes up and make her chest hurt. Abdominal pain, diarrhea x 4 days , bloating , sour taste,gas and  nauseated x 20 days     S: Reviewed CMA documentation. I have called patient and gathered additional HPI as follows:   Abdominal Pain / GERD Reports that symptoms started >3 weeks ago, worse in past 4 days. Describes abdominal bloating, epigastric pain, acid reflux symptoms heartburn, dyspepsia. She has issue with symptoms following with reflux pain, upper gastric pain, some improvement with eating at times temporarily, she has had increased gas and bloating and burping. Admits sour taste in mouth and dyspepsia - Tried OTC Nexium and other antacids, taking Nexium 20mg  BID, Pepto  Known history of H Pylori up to 4 flares in past, last 1.5 year ago, has been treated with Flagyl + Clarithromycin Admits diarrhea, recurrent (onset 4 days ago, seems to be persistent)  Denies any high risk travel to areas of current concern for COVID19. Denies any known or suspected exposure to person with or possibly with COVID19.  Admits gas, bloating,  diarrhea, abdominal pain Denies any fevers, chills, sweats, body ache, cough, shortness of breath, sinus pain or pressure, headache   Past Medical History:  Diagnosis Date  . GERD (gastroesophageal reflux disease)   . Hyperlipidemia   . Hypertension   . Osteoporosis    Social History   Tobacco Use  . Smoking status: Former Smoker    Types: Cigarettes  . Smokeless tobacco: Never Used  . Tobacco comment: Quit 40years ago.   Substance Use Topics  . Alcohol use: Yes  . Drug use: Never    Current Outpatient Medications:  .  alendronate (FOSAMAX) 70 MG tablet, Take 1 tablet (70 mg total) by mouth once a week. Take with a full glass of water on an empty stomach., Disp: 12 tablet, Rfl: 3 .  Azelastine-Fluticasone 137-50 MCG/ACT SUSP, Place into the nose., Disp: , Rfl:  .  estradiol (ESTRACE) 2 MG tablet, Take 1 tablet (2 mg total) by mouth daily., Disp: 90 tablet, Rfl: 3 .  losartan (COZAAR) 50 MG tablet, Take 1 tablet (50 mg total) by mouth daily., Disp: 90 tablet, Rfl: 1 .  montelukast (SINGULAIR) 10 MG tablet, Take 10 mg by mouth at bedtime., Disp: , Rfl:  .  rosuvastatin (CRESTOR) 10 MG tablet, Take 1 tablet (10 mg total) by mouth daily., Disp: 90 tablet, Rfl: 3 .  verapamil (CALAN-SR) 240 MG CR tablet, Take 240 mg by mouth at bedtime., Disp: , Rfl:  .  clarithromycin (BIAXIN) 500 MG tablet, Take 1 tablet (500 mg total) by mouth 2 (two) times daily., Disp: 28 tablet, Rfl: 0 .  metroNIDAZOLE (FLAGYL) 500 MG tablet, Take 1 tablet (500 mg total) by mouth 3 (  three) times daily. Do not drink alcohol while taking this medicine., Disp: 42 tablet, Rfl: 0 .  omeprazole (PRILOSEC) 40 MG capsule, Take one pill 40mg  twice a day before meals for 2 weeks, then reduce to once daily, Disp: 42 capsule, Rfl: 0  Depression screen [facility] 2/9 03/23/2019 03/01/2019  Decreased Interest 0 0  Down, Depressed, Hopeless 0 0  PHQ - 2 Score 0 0  Altered sleeping 0 0  Tired, decreased energy 0 3  Change in  appetite 0 0  Feeling bad or failure about yourself  0 0  Trouble concentrating 0 0  Moving slowly or fidgety/restless 0 0  Suicidal thoughts 0 0  PHQ-9 Score 0 3  Difficult doing work/chores Not difficult at all Not difficult at all    No flowsheet data found.  -------------------------------------------------------------------------- O: No physical exam performed due to remote telephone encounter.  Lab results reviewed.  Recent Results (from the past 2160 hour(s))  Hepatitis C antibody     Status: None   Collection Time: 03/16/19  8:00 AM  Result Value Ref Range   Hepatitis C Ab NON-REACTIVE NON-REACTI   SIGNAL TO CUT-OFF 0.00 <1.00    Comment: . HCV antibody was non-reactive. There is no laboratory  evidence of HCV infection. . In most cases, no further action is required. However, if recent HCV exposure is suspected, a test for HCV RNA (test code [phone]) is suggested. . For additional information please refer to http://education.[facility].com/faq/FAQ22v1 (This link is being provided for informational/ educational purposes only.) .   TSH     Status: None   Collection Time: 03/16/19  8:00 AM  Result Value Ref Range   TSH 2.51 0.40 - 4.50 mIU/L  Lipid panel     Status: None   Collection Time: 03/16/19  8:00 AM  Result Value Ref Range   Cholesterol 131 <200 mg/dL   HDL 64 > OR = 50 mg/dL   Triglycerides 47 <150 mg/dL   LDL Cholesterol (Calc) 54 mg/dL (calc)    Comment: Reference range: <100 . Desirable range <100 mg/dL for primary prevention;   <70 mg/dL for patients with CHD or diabetic patients  with > or = 2 CHD risk factors. Marland Kitchen LDL-C is now calculated using the Martin-Hopkins  calculation, which is a validated novel method providing  better accuracy than the Friedewald equation in the  estimation of LDL-C.  Cresenciano Genre et al. Annamaria Helling. [id number]): 2061-2068  (http://education.[facility].com/faq/FAQ164)    Total CHOL/HDL Ratio 2.0 <5.0 (calc)    Non-HDL Cholesterol (Calc) 67 <130 mg/dL (calc)    Comment: For patients with diabetes plus 1 major ASCVD risk  factor, treating to a non-HDL-C goal of <100 mg/dL  (LDL-C of <70 mg/dL) is considered a therapeutic  option.   COMPLETE METABOLIC PANEL WITH GFR     Status: Abnormal   Collection Time: 03/16/19  8:00 AM  Result Value Ref Range   Glucose, Bld 74 65 - 99 mg/dL    Comment: .            Fasting reference interval .    BUN 21 7 - 25 mg/dL   Creat 0.96 0.50 - 0.99 mg/dL    Comment: For patients >85 years of age, the reference limit for Creatinine is approximately 13% higher for people identified as African-American. .    GFR, Est Non African American 63 > OR = 60 mL/min/1.29m2   GFR, Est African American 73 > OR = 60 mL/min/1.34m2   BUN/Creatinine Ratio NOT  APPLICABLE 6 - 22 (calc)   Sodium 142 135 - 146 mmol/L   Potassium 4.2 3.5 - 5.3 mmol/L   Chloride 105 98 - 110 mmol/L   CO2 27 20 - 32 mmol/L   Calcium 10.1 8.6 - 10.4 mg/dL   Total Protein 6.9 6.1 - 8.1 g/dL   Albumin 4.6 3.6 - 5.1 g/dL   Globulin 2.3 1.9 - 3.7 g/dL (calc)   AG Ratio 2.0 1.0 - 2.5 (calc)   Total Bilirubin 0.6 0.2 - 1.2 mg/dL   Alkaline phosphatase (APISO) 34 (L) 37 - 153 U/L   AST 19 10 - 35 U/L   ALT 13 6 - 29 U/L  CBC with Differential/Platelet     Status: None   Collection Time: 03/16/19  8:00 AM  Result Value Ref Range   WBC 3.8 3.8 - 10.8 Thousand/uL   RBC 3.84 3.80 - 5.10 Million/uL   Hemoglobin 11.7 11.7 - 15.5 g/dL   HCT 35.5 35.0 - 45.0 %   MCV 92.4 80.0 - 100.0 fL   MCH 30.5 27.0 - 33.0 pg   MCHC 33.0 32.0 - 36.0 g/dL   RDW 12.0 11.0 - 15.0 %   Platelets 282 140 - 400 Thousand/uL   MPV 11.2 7.5 - 12.5 fL   Neutro Abs 1,820 1,500 - 7,800 cells/uL   Lymphs Abs 1,493 850 - 3,900 cells/uL   Absolute Monocytes 338 200 - 950 cells/uL   Eosinophils Absolute 99 15 - 500 cells/uL   Basophils Absolute 49 0 - 200 cells/uL   Neutrophils Relative % 47.9 %   Total Lymphocyte 39.3 %    Monocytes Relative 8.9 %   Eosinophils Relative 2.6 %   Basophils Relative 1.3 %  Hemoglobin A1c     Status: None   Collection Time: 03/16/19  8:00 AM  Result Value Ref Range   Hgb A1c MFr Bld 5.0 <5.7 % of total Hgb    Comment: For the purpose of screening for the presence of diabetes: . <5.7%       Consistent with the absence of diabetes 5.7-6.4%    Consistent with increased risk for diabetes             (prediabetes) > or =6.5%  Consistent with diabetes . This assay result is consistent with a decreased risk of diabetes. . Currently, no consensus exists regarding use of hemoglobin A1c for diagnosis of diabetes in children. . According to American Diabetes Association (ADA) guidelines, hemoglobin A1c <7.0% represents optimal control in non-pregnant diabetic patients. Different metrics may apply to specific patient populations.  Standards of Medical Care in Diabetes(ADA). .    Mean Plasma Glucose 97 (calc)   eAG (mmol/L) 5.4 (calc)    -------------------------------------------------------------------------- A&P:  Problem List Items Addressed This Visit    None    Visit Diagnoses    H. pylori infection    -  Primary   Relevant Medications   clarithromycin (BIAXIN) 500 MG tablet   metroNIDAZOLE (FLAGYL) 500 MG tablet   omeprazole (PRILOSEC) 40 MG capsule   Other Relevant Orders   Ambulatory referral to Gastroenterology   Epigastric abdominal pain       Relevant Orders   Ambulatory referral to Gastroenterology   Gastroesophageal reflux disease, unspecified whether esophagitis present       Relevant Medications   omeprazole (PRILOSEC) 40 MG capsule   Other Relevant Orders   Ambulatory referral to Gastroenterology   Abdominal bloating       Relevant Orders  Ambulatory referral to Gastroenterology     Suspected acute flare on chronic GERD with recent worsening due to possible H Pylori infection given her history of recurrence in past. Unable to assess abdomen  today given virtual visit. Prior GI with H Pylori treatment out of state in [place].  Plan: 1. Agree to empirically cover for H Pylori, given she is already on PPI for few weeks, and would have to stop for 2 weeks prior to breath test, and delay her treatment course. Discussed risk and benefit - we agree mutually to start therapy - Start 2 weeks - Omeprazole to 40mg  BID dosing, add on triple therapy antibiotics with Metronidazole (500mg  TID) and clarithromycin (500mg  BID) for 2 weeks, then resume daily PPI only - Omeprazole 40mg  daily - will need new rx after 1 month - Diet modifications reduce GERD - Future consider add Carafate PRN, Zofran  Referral; to [facility] for further management, anticipate will need EGD and H Pylori biopsy.  Orders Placed This Encounter  Procedures  . Ambulatory referral to Gastroenterology    Referral Priority:   Routine    Referral Type:   Consultation    Referral Reason:   Specialty Services Required    Number of Visits Requested:   1      Meds ordered this encounter  Medications  . clarithromycin (BIAXIN) 500 MG tablet    Sig: Take 1 tablet (500 mg total) by mouth 2 (two) times daily.    Dispense:  28 tablet    Refill:  0  . metroNIDAZOLE (FLAGYL) 500 MG tablet    Sig: Take 1 tablet (500 mg total) by mouth 3 (three) times daily. Do not drink alcohol while taking this medicine.    Dispense:  42 tablet    Refill:  0  . omeprazole (PRILOSEC) 40 MG capsule    Sig: Take one pill 40mg  twice a day before meals for 2 weeks, then reduce to once daily    Dispense:  42 capsule    Refill:  0    Follow-up: - Return as needed  Patient verbalizes understanding with the above medical recommendations including the limitation of remote medical advice.  Specific follow-up and call-back criteria were given for patient to follow-up or seek medical care more urgently if needed.   - Time spent in direct consultation with patient on phone: 12  minutes   Nobie Putnam, [facility] 06/12/2019, 3:37 PM

## 2019-07-03 ENCOUNTER — Ambulatory Visit: Payer: Federal, State, Local not specified - PPO | Admitting: Gastroenterology

## 2019-07-03 ENCOUNTER — Encounter: Payer: Self-pay | Admitting: Gastroenterology

## 2019-07-03 ENCOUNTER — Other Ambulatory Visit
Admission: RE | Admit: 2019-07-03 | Discharge: 2019-07-03 | Disposition: A | Discharge status: Home / Self Care | Payer: Federal, State, Local not specified - PPO | Source: Ambulatory Visit | Attending: Gastroenterology | Admitting: Gastroenterology

## 2019-07-03 ENCOUNTER — Other Ambulatory Visit: Payer: Self-pay

## 2019-07-03 VITALS — BP 160/102 | HR 77 | Temp 98.3°F | Wt 180.2 lb

## 2019-07-03 DIAGNOSIS — A048 Other specified bacterial intestinal infections: Secondary | ICD-10-CM | POA: Insufficient documentation

## 2019-07-03 DIAGNOSIS — K581 Irritable bowel syndrome with constipation: Secondary | ICD-10-CM

## 2019-07-03 DIAGNOSIS — Z791 Long term (current) use of non-steroidal anti-inflammatories (NSAID): Secondary | ICD-10-CM | POA: Diagnosis not present

## 2019-07-03 MED ORDER — OMEPRAZOLE 40 MG PO CPDR: 40.0000 mg | DELAYED_RELEASE_CAPSULE | Freq: Every day | ORAL | 0 refills | Status: DC

## 2019-07-03 NOTE — Progress Notes (Signed)
Jonathon Bellows MD, MRCP(U.K) [address]  [place]  [place], [place] [address]  Main: [phone]  Fax: [phone]   Gastroenterology Consultation  Referring Provider:     Nobie Putnam * Primary Care Physician:  Olin Hauser, DO Primary Gastroenterologist:  Dr. Jonathon Bellows  Reason for Consultation:    Abdominal pain and GERD        HPI:   Kendra Allison is a 64 y.o. y/o female referred for consultation & management  by Dr. Parks Ranger, Devonne Doughty, DO.    It appears that she has been treated for H. pylori in the past with antibiotics.  Mentioned that she has been treated with clarithromycin based therapy last.  03/16/2019: CMP normal, CBC normal.  Last colonoscopy was in 2018 in [place].  I have reviewed her report and she does not require a repeat colonoscopy for 10 years.  She says that she has had abdominal pain since October 2020.  Gradually getting worse.  Usually occurs right after she eats any type of food.  Last for a long time.  Relieved with a good bowel movement.  She does not have a bowel movement every day.  Probably 2-3 times a week.  Very hard.  Has taken fiber pills has not helped.  She recalls when she was treated for H. pylori the abdominal pain improved but gradually got worse afterwards.  Does complain of abdominal pain with associated with distention and bloating.  She has not tried anything for constipation.  She has been with taking Prilosec but has been taking it with meals or after meals.  No weight loss.  She has been taking Aleve every day to help with her sleep.  Past Medical History:  Diagnosis Date  . GERD (gastroesophageal reflux disease)   . Hyperlipidemia   . Hypertension   . Osteoporosis     Past Surgical History:  Procedure Laterality Date  . BREAST BIOPSY      Prior to Admission medications   Medication Sig Start Date End Date Taking? Authorizing Provider  alendronate (FOSAMAX) 70 MG tablet Take 1 tablet (70  mg total) by mouth once a week. Take with a full glass of water on an empty stomach. 05/10/19   Harlin Heys, MD  Azelastine-Fluticasone [phone] MCG/ACT SUSP Place into the nose.    [provider]  clarithromycin (BIAXIN) 500 MG tablet Take 1 tablet (500 mg total) by mouth 2 (two) times daily. 06/12/19   Karamalegos, Devonne Doughty, DO  estradiol (ESTRACE) 2 MG tablet Take 1 tablet (2 mg total) by mouth daily. 05/10/19   Harlin Heys, MD  losartan (COZAAR) 50 MG tablet Take 1 tablet (50 mg total) by mouth daily. 05/22/19   Karamalegos, Devonne Doughty, DO  metroNIDAZOLE (FLAGYL) 500 MG tablet Take 1 tablet (500 mg total) by mouth 3 (three) times daily. Do not drink alcohol while taking this medicine. 06/12/19   Karamalegos, Devonne Doughty, DO  montelukast (SINGULAIR) 10 MG tablet Take 10 mg by mouth at bedtime.    [provider]  omeprazole (PRILOSEC) 40 MG capsule Take one pill 40mg  twice a day before meals for 2 weeks, then reduce to once daily 06/12/19   Parks Ranger, Devonne Doughty, DO  rosuvastatin (CRESTOR) 10 MG tablet Take 1 tablet (10 mg total) by mouth daily. 03/23/19   Karamalegos, Devonne Doughty, DO  verapamil (CALAN-SR) 240 MG CR tablet Take 240 mg by mouth at bedtime.    [provider]    History  reviewed. No pertinent family history.   Social History   Tobacco Use  . Smoking status: Former Smoker    Types: Cigarettes  . Smokeless tobacco: Never Used  . Tobacco comment: Quit 40years ago.   Substance Use Topics  . Alcohol use: Yes  . Drug use: Never    Allergies as of 07/03/2019  . (No Known Allergies)    Review of Systems:    All systems reviewed and negative except where noted in HPI.   Physical Exam:  BP (!) 170/107 (BP Location: Left Arm, Patient Position: Sitting, Cuff Size: Normal)   Pulse 77   Temp 98.3 F (36.8 C) (Oral)   Wt 180 lb 4 oz (81.8 kg)   BMI 30.00 kg/m  No LMP recorded. Patient has had a hysterectomy. Psych:  Alert and  cooperative. Normal mood and affect. General:   Alert,  Well-developed, well-nourished, pleasant and cooperative in NAD Head:  Normocephalic and atraumatic. Eyes:  Sclera clear, no icterus.   Conjunctiva pink. Ears:  Normal auditory acuity. Lungs:  Respirations even and unlabored.  Clear throughout to auscultation.   No wheezes, crackles, or rhonchi. No acute distress. Heart:  Regular rate and rhythm; no murmurs, clicks, rubs, or gallops. Abdomen:  Normal bowel sounds.  No bruits.  Soft, non-tender and non-distended without masses, hepatosplenomegaly or hernias noted.  No guarding or rebound tenderness.    Neurologic:  Alert and oriented x3;  grossly normal neurologically. Psych:  Alert and cooperative. Normal mood and affect.  Imaging Studies: No results found.  Assessment and Plan:   Kendra Allison is a 64 y.o. y/o female has been referred for abdominal pain and GERD.  History of H. pylori in the past which she has been treated with clarithromycin based therapy.  Long-term use of NSAIDs.  History of constipation.  Very likely that her abdominal pain is probably due to a combination of gastritis from NSAID use and possibly from irritable bowel syndrome with constipation.  Plan 1.  Check H. pylori breath test to confirm eradication of H. Pylori 2.  High-fiber diet, fiber pills will be provided, commence on MiraLAX 1 capful twice a day.  If no better will start on a different agent. 3.  Commence on Prilosec 40 mg once a day and I have stressed that she needs to take the Prilosec 30 minutes before breakfast. 4.  Stop all NSAID use. 5.  If no better will consider evaluation of the gallbladder, imaging of the abdomen and possibly endoscopy     Follow up in next weeks  Dr Jonathon Bellows MD,MRCP(U.K)

## 2019-07-03 NOTE — Patient Instructions (Addendum)
Please stop the Aleve.  Please take the Fiber pills 3 times a day  Take Miralax once in the morning and once in the evening.  Take the omeprazole 40mg  once a day.

## 2019-07-04 LAB — H. PYLORI BREATH TEST: H. pylori UBiT: NEGATIVE

## 2019-07-05 ENCOUNTER — Encounter: Payer: Self-pay | Admitting: Gastroenterology

## 2019-07-10 ENCOUNTER — Telehealth: Payer: Self-pay

## 2019-07-10 ENCOUNTER — Other Ambulatory Visit: Payer: Self-pay

## 2019-07-10 DIAGNOSIS — R109 Unspecified abdominal pain: Secondary | ICD-10-CM

## 2019-07-10 DIAGNOSIS — R140 Abdominal distension (gaseous): Secondary | ICD-10-CM

## 2019-07-10 NOTE — Telephone Encounter (Signed)
Spoke with Kendra Allison and informed her of Dr. Georgeann Oppenheim recommendation to proceed with the CT scan and also start a trial of Trulance for the constipation. I explained to Kendra Allison that we have samples of Trulance available for pick up here at our office, Kendra Allison plans to pick up samples this week. We have also scheduled the CT scan.

## 2019-07-10 NOTE — Telephone Encounter (Signed)
Pt called requesting Dr. Georgeann Oppenheim advice. Pt states her abdominal pain is now worse and she has also developed pain in her legs. Pt states the Miralax has not worked, she's taken it everyday since her visit last Monday but has only had 2 bowel movements between then and now. Pt wants to know if she should have a CT scan of her abdomen. I explained that I will forward this information to Dr. Vicente Males for recommendations.

## 2019-07-10 NOTE — Telephone Encounter (Signed)
1. CT abdomen and pelvis  2. Start on [employer]

## 2019-07-19 ENCOUNTER — Other Ambulatory Visit: Payer: Self-pay

## 2019-07-19 ENCOUNTER — Ambulatory Visit
Admission: RE | Admit: 2019-07-19 | Discharge: 2019-07-19 | Disposition: A | Discharge status: Home / Self Care | Payer: Federal, State, Local not specified - PPO | Source: Ambulatory Visit | Attending: Gastroenterology | Admitting: Gastroenterology

## 2019-07-19 DIAGNOSIS — R109 Unspecified abdominal pain: Secondary | ICD-10-CM | POA: Insufficient documentation

## 2019-07-19 DIAGNOSIS — R140 Abdominal distension (gaseous): Secondary | ICD-10-CM | POA: Insufficient documentation

## 2019-07-19 LAB — POCT I-STAT CREATININE: Creatinine, Ser: 1.1 mg/dL — ABNORMAL HIGH (ref 0.44–1.00)

## 2019-07-19 MED ORDER — IOHEXOL 300 MG/ML  SOLN
100.0000 mL | Freq: Once | INTRAMUSCULAR | Status: AC | PRN
  Administered 2019-07-19: 100 mL via INTRAVENOUS

## 2019-07-20 ENCOUNTER — Encounter: Payer: Self-pay | Admitting: Gastroenterology

## 2019-07-21 ENCOUNTER — Telehealth: Payer: Self-pay

## 2019-07-21 NOTE — Telephone Encounter (Signed)
-----   Message from Jonathon Bellows, MD sent at 07/20/2019 10:43 AM EST ----- Inform that only abnormality seen is a small hiatal hernia and large quantity of stool as I suspected during her office visit.  Confirm that she has started taking her MiraLAX and check if she is having a good bowel movement if not let start her on Linzess 145 mcg

## 2019-07-21 NOTE — Telephone Encounter (Signed)
Spoke with pt and informed her of CT scan result and Dr. Georgeann Oppenheim recommendation. Pt states the Miralax did not help so we started her on a trial of Trulance (refer to 07-10-19 telephone encounter). Pt states the Trulance is too strong as it caused diarrhea. Pt states although she's had bowel movements, she's still experiencing bloating, upper abdominal pain when eating/drinking and pain in her lower abdomen when walking. Pt wants to know if Dr. Vicente Males feels an EGD/Colonoscopy would be the next step?

## 2019-07-24 ENCOUNTER — Other Ambulatory Visit: Payer: Self-pay

## 2019-07-24 DIAGNOSIS — R140 Abdominal distension (gaseous): Secondary | ICD-10-CM

## 2019-07-24 DIAGNOSIS — Z791 Long term (current) use of non-steroidal anti-inflammatories (NSAID): Secondary | ICD-10-CM

## 2019-07-24 DIAGNOSIS — R109 Unspecified abdominal pain: Secondary | ICD-10-CM

## 2019-07-24 MED ORDER — NA SULFATE-K SULFATE-MG SULF 17.5-3.13-1.6 GM/177ML PO SOLN: 1.0000 | Freq: Once | ORAL | 0 refills | Status: AC

## 2019-07-24 NOTE — Telephone Encounter (Signed)
Yes can schedule and in meantime stop trulance and start linzess 145 mcg

## 2019-07-24 NOTE — Telephone Encounter (Signed)
Spoke with pt and informed her of Dr. Georgeann Oppenheim recommendation. Pt agrees and we were able to schedule the EGD/Colonoscopy. Pt plans to stop by the office this week to pick up samples of the Linzess.

## 2019-08-08 ENCOUNTER — Other Ambulatory Visit
Admission: RE | Admit: 2019-08-08 | Discharge: 2019-08-08 | Disposition: A | Discharge status: Home / Self Care | Payer: Federal, State, Local not specified - PPO | Source: Ambulatory Visit | Attending: Gastroenterology | Admitting: Gastroenterology

## 2019-08-08 DIAGNOSIS — Z20822 Contact with and (suspected) exposure to covid-19: Secondary | ICD-10-CM | POA: Insufficient documentation

## 2019-08-08 DIAGNOSIS — Z01812 Encounter for preprocedural laboratory examination: Secondary | ICD-10-CM | POA: Insufficient documentation

## 2019-08-08 LAB — SARS CORONAVIRUS 2 (TAT 6-24 HRS): SARS Coronavirus 2: NEGATIVE

## 2019-08-09 ENCOUNTER — Encounter: Payer: Self-pay | Admitting: Gastroenterology

## 2019-08-10 ENCOUNTER — Encounter: Payer: Self-pay | Admitting: Gastroenterology

## 2019-08-10 ENCOUNTER — Ambulatory Visit: Payer: Federal, State, Local not specified - PPO | Admitting: Certified Registered Nurse Anesthetist

## 2019-08-10 ENCOUNTER — Ambulatory Visit
Admission: RE | Admit: 2019-08-10 | Discharge: 2019-08-10 | Disposition: A | Discharge status: Home / Self Care | Payer: Federal, State, Local not specified - PPO | Attending: Gastroenterology | Admitting: Gastroenterology

## 2019-08-10 ENCOUNTER — Other Ambulatory Visit: Payer: Self-pay

## 2019-08-10 ENCOUNTER — Encounter
Admission: RE | Disposition: A | Discharge status: Home / Self Care | Payer: Self-pay | Source: Home / Self Care | Attending: Gastroenterology

## 2019-08-10 DIAGNOSIS — Z79899 Other long term (current) drug therapy: Secondary | ICD-10-CM | POA: Insufficient documentation

## 2019-08-10 DIAGNOSIS — R140 Abdominal distension (gaseous): Secondary | ICD-10-CM

## 2019-08-10 DIAGNOSIS — K2950 Unspecified chronic gastritis without bleeding: Secondary | ICD-10-CM | POA: Diagnosis not present

## 2019-08-10 DIAGNOSIS — K219 Gastro-esophageal reflux disease without esophagitis: Secondary | ICD-10-CM | POA: Insufficient documentation

## 2019-08-10 DIAGNOSIS — D122 Benign neoplasm of ascending colon: Secondary | ICD-10-CM | POA: Diagnosis not present

## 2019-08-10 DIAGNOSIS — R109 Unspecified abdominal pain: Secondary | ICD-10-CM | POA: Diagnosis not present

## 2019-08-10 DIAGNOSIS — K640 First degree hemorrhoids: Secondary | ICD-10-CM | POA: Insufficient documentation

## 2019-08-10 DIAGNOSIS — E785 Hyperlipidemia, unspecified: Secondary | ICD-10-CM | POA: Insufficient documentation

## 2019-08-10 DIAGNOSIS — I10 Essential (primary) hypertension: Secondary | ICD-10-CM | POA: Diagnosis not present

## 2019-08-10 DIAGNOSIS — M810 Age-related osteoporosis without current pathological fracture: Secondary | ICD-10-CM | POA: Insufficient documentation

## 2019-08-10 DIAGNOSIS — K635 Polyp of colon: Secondary | ICD-10-CM

## 2019-08-10 DIAGNOSIS — Z87891 Personal history of nicotine dependence: Secondary | ICD-10-CM | POA: Insufficient documentation

## 2019-08-10 DIAGNOSIS — Z791 Long term (current) use of non-steroidal anti-inflammatories (NSAID): Secondary | ICD-10-CM

## 2019-08-10 HISTORY — PX: COLONOSCOPY WITH PROPOFOL: SHX5780

## 2019-08-10 HISTORY — PX: ESOPHAGOGASTRODUODENOSCOPY (EGD) WITH PROPOFOL: SHX5813

## 2019-08-10 SURGERY — COLONOSCOPY WITH PROPOFOL
Anesthesia: General

## 2019-08-10 MED ORDER — PROPOFOL 500 MG/50ML IV EMUL
INTRAVENOUS | Status: AC
  Filled 2019-08-10: qty 50

## 2019-08-10 MED ORDER — LIDOCAINE HCL (PF) 2 % IJ SOLN
INTRAMUSCULAR | Status: AC
  Filled 2019-08-10: qty 5

## 2019-08-10 MED ORDER — PROPOFOL 10 MG/ML IV BOLUS
INTRAVENOUS | Status: DC | PRN
  Administered 2019-08-10: 70 mg via INTRAVENOUS

## 2019-08-10 MED ORDER — PROPOFOL 500 MG/50ML IV EMUL
INTRAVENOUS | Status: DC | PRN
  Administered 2019-08-10: 150 ug/kg/min via INTRAVENOUS

## 2019-08-10 MED ORDER — LIDOCAINE HCL (CARDIAC) PF 100 MG/5ML IV SOSY
PREFILLED_SYRINGE | INTRAVENOUS | Status: DC | PRN
  Administered 2019-08-10: 50 mg via INTRAVENOUS

## 2019-08-10 MED ORDER — SODIUM CHLORIDE 0.9 % IV SOLN
INTRAVENOUS | Status: DC
  Administered 2019-08-10: 1000 mL via INTRAVENOUS

## 2019-08-10 NOTE — Anesthesia Postprocedure Evaluation (Signed)
Anesthesia Post Note  Patient: Kendra Allison  Procedure(s) Performed: COLONOSCOPY WITH PROPOFOL (N/A ) ESOPHAGOGASTRODUODENOSCOPY (EGD) WITH PROPOFOL (N/A )  Patient location during evaluation: Endoscopy Anesthesia Type: General Level of consciousness: awake and alert Pain management: pain level controlled Vital Signs Assessment: post-procedure vital signs reviewed and stable Respiratory status: spontaneous breathing, nonlabored ventilation, respiratory function stable and patient connected to nasal cannula oxygen Cardiovascular status: blood pressure returned to baseline and stable Postop Assessment: no apparent nausea or vomiting Anesthetic complications: no     Last Vitals:  Vitals:   08/10/19 0857 08/10/19 0906  BP: 121/77 (!) 159/96  Pulse: 64 65  Resp: 20 20  Temp:    SpO2: 98% 99%    Last Pain:  Vitals:   08/10/19 0906  TempSrc:   PainSc: 0-No pain                 Precious Haws Burrel Legrand

## 2019-08-10 NOTE — H&P (Signed)
Kendra Bellows, MD [address] [place], [place], [place], [address] [address], [place], [place], [place], [address] Phone: [phone]  Fax: [phone]  Primary Care Physician:  Olin Hauser, DO   Pre-Procedure History & Physical: HPI:  Kendra Allison is a 64 y.o. female is here for an endoscopy and colonoscopy    Past Medical History:  Diagnosis Date  . GERD (gastroesophageal reflux disease)   . Hyperlipidemia   . Hypertension   . Osteoporosis     Past Surgical History:  Procedure Laterality Date  . BREAST BIOPSY      Prior to Admission medications   Medication Sig Start Date End Date Taking? Authorizing Provider  alendronate (FOSAMAX) 70 MG tablet Take 1 tablet (70 mg total) by mouth once a week. Take with a full glass of water on an empty stomach. 05/10/19  Yes Harlin Heys, MD  Azelastine-Fluticasone [phone] MCG/ACT SUSP Place into the nose.   Yes [provider]  clarithromycin (BIAXIN) 500 MG tablet Take 1 tablet (500 mg total) by mouth 2 (two) times daily. 06/12/19  Yes Karamalegos, Devonne Doughty, DO  estradiol (ESTRACE) 2 MG tablet Take 1 tablet (2 mg total) by mouth daily. 05/10/19  Yes Harlin Heys, MD  losartan (COZAAR) 50 MG tablet Take 1 tablet (50 mg total) by mouth daily. 05/22/19  Yes Karamalegos, Devonne Doughty, DO  metroNIDAZOLE (FLAGYL) 500 MG tablet Take 1 tablet (500 mg total) by mouth 3 (three) times daily. Do not drink alcohol while taking this medicine. 06/12/19  Yes Karamalegos, Alexander J, DO  montelukast (SINGULAIR) 10 MG tablet Take 10 mg by mouth at bedtime.   Yes [provider]  omeprazole (PRILOSEC) 40 MG capsule Take one pill 40mg  twice a day before meals for 2 weeks, then reduce to once daily 06/12/19  Yes Karamalegos, Devonne Doughty, DO  omeprazole (PRILOSEC) 40 MG capsule Take 1 capsule (40 mg total) by mouth daily. 07/03/19  Yes Kendra Bellows, MD  rosuvastatin (CRESTOR) 10 MG tablet Take 1 tablet (10  mg total) by mouth daily. 03/23/19  Yes Karamalegos, Devonne Doughty, DO  verapamil (CALAN-SR) 240 MG CR tablet Take 240 mg by mouth at bedtime.   Yes [provider]    Allergies as of 07/24/2019  . (No Known Allergies)    History reviewed. No pertinent family history.  Social History   Socioeconomic History  . Marital status: Single    Spouse name: Not on file  . Number of children: Not on file  . Years of education: Not on file  . Highest education level: Not on file  Occupational History  . Not on file  Tobacco Use  . Smoking status: Former Smoker    Types: Cigarettes  . Smokeless tobacco: Never Used  . Tobacco comment: Quit 40years ago.   Substance and Sexual Activity  . Alcohol use: Yes  . Drug use: Never  . Sexual activity: Not Currently  Other Topics Concern  . Not on file  Social History Narrative  . Not on file   Social Determinants of Health   Financial Resource Strain:   . Difficulty of Paying Living Expenses:   Food Insecurity:   . Worried About Charity fundraiser in the Last Year:   . Arboriculturist in the Last Year:   Transportation Needs:   . Film/video editor (Medical):   Marland Kitchen Lack of Transportation (Non-Medical):   Physical Activity:   . Days of  Exercise per Week:   . Minutes of Exercise per Session:   Stress:   . Feeling of Stress :   Social Connections:   . Frequency of Communication with Friends and Family:   . Frequency of Social Gatherings with Friends and Family:   . Attends Religious Services:   . Active Member of Clubs or Organizations:   . Attends Archivist Meetings:   Marland Kitchen Marital Status:   Intimate Partner Violence:   . Fear of Current or Ex-Partner:   . Emotionally Abused:   Marland Kitchen Physically Abused:   . Sexually Abused:     Review of Systems: See HPI, otherwise negative ROS  Physical Exam: BP (!) 202/110   Pulse 72   Temp (!) 97.5 F (36.4 C) (Temporal)   Resp 18   Ht 5\' 5"  (1.651 m)   Wt 81.6 kg    SpO2 100%   BMI 29.95 kg/m  General:   Alert,  pleasant and cooperative in NAD Head:  Normocephalic and atraumatic. Neck:  Supple; no masses or thyromegaly. Lungs:  Clear throughout to auscultation, normal respiratory effort.    Heart:  +S1, +S2, Regular rate and rhythm, No edema. Abdomen:  Soft, nontender and nondistended. Normal bowel sounds, without guarding, and without rebound.   Neurologic:  Alert and  oriented x4;  grossly normal neurologically.  Impression/Plan: Kendra Allison is here for an endoscopy and colonoscopy  to be performed for  evaluation of abdominalpain    Risks, benefits, limitations, and alternatives regarding endoscopy have been reviewed with the patient.  Questions have been answered.  All parties agreeable.   Kendra Bellows, MD  08/10/2019, 8:01 AM

## 2019-08-10 NOTE — Anesthesia Preprocedure Evaluation (Signed)
Anesthesia Evaluation  Patient identified by MRN, date of birth, ID band Patient awake    Reviewed: Allergy & Precautions, H&P , NPO status , Patient's Chart, lab work & pertinent test results  History of Anesthesia Complications Negative for: history of anesthetic complications  Airway Mallampati: II  TM Distance: >3 FB Neck ROM: full    Dental  (+) Chipped   Pulmonary asthma , former smoker,           Cardiovascular Exercise Tolerance: Good hypertension, (-) angina(-) Past MI and (-) DOE      Neuro/Psych negative neurological ROS  negative psych ROS   GI/Hepatic Neg liver ROS, GERD  Medicated and Controlled,  Endo/Other  negative endocrine ROS  Renal/GU negative Renal ROS  negative genitourinary   Musculoskeletal   Abdominal   Peds  Hematology negative hematology ROS (+)   Anesthesia Other Findings Past Medical History: No date: GERD (gastroesophageal reflux disease) No date: Hyperlipidemia No date: Hypertension No date: Osteoporosis  Past Surgical History: No date: BREAST BIOPSY  BMI    Body Mass Index: 29.95 kg/m      Reproductive/Obstetrics negative OB ROS                             Anesthesia Physical Anesthesia Plan  ASA: III  Anesthesia Plan: General   Post-op Pain Management:    Induction: Intravenous  PONV Risk Score and Plan: Propofol infusion and TIVA  Airway Management Planned: Natural Airway and Nasal Cannula  Additional Equipment:   Intra-op Plan:   Post-operative Plan:   Informed Consent: I have reviewed the patients History and Physical, chart, labs and discussed the procedure including the risks, benefits and alternatives for the proposed anesthesia with the patient or authorized representative who has indicated his/her understanding and acceptance.     Dental Advisory Given  Plan Discussed with: Anesthesiologist, CRNA and  Surgeon  Anesthesia Plan Comments: (Patient consented for risks of anesthesia including but not limited to:  - adverse reactions to medications - risk of intubation if required - damage to teeth, lips or other oral mucosa - sore throat or hoarseness - Damage to heart, brain, lungs or loss of life  Patient voiced understanding.)        Anesthesia Quick Evaluation

## 2019-08-10 NOTE — Anesthesia Procedure Notes (Signed)
Date/Time: 08/10/2019 8:05 AM Performed by: Johnna Acosta, CRNA Pre-anesthesia Checklist: Patient identified, Emergency Drugs available, Suction available, Patient being monitored and Timeout performed Patient Re-evaluated:Patient Re-evaluated prior to induction Oxygen Delivery Method: Nasal cannula Preoxygenation: Pre-oxygenation with 100% oxygen Induction Type: IV induction

## 2019-08-10 NOTE — Op Note (Signed)
[facility] Gastroenterology Patient Name: Kendra Allison Procedure Date: 08/10/2019 8:04 AM MRN: [id number] Account #: [id number] Date of Birth: Jun 26, 1955 Admit Type: Outpatient Age: 64 Room: [facility] ENDO ROOM 1 Gender: Female Note Status: Finalized Procedure:             Colonoscopy Indications:           Abdominal pain Providers:             Jonathon Bellows MD, MD Referring MD:          Olin Hauser (Referring MD) Medicines:             Monitored Anesthesia Care Complications:         No immediate complications. Procedure:             Pre-Anesthesia Assessment:                        - Prior to the procedure, a History and Physical was                         performed, and patient medications, allergies and                         sensitivities were reviewed. The patient's tolerance                         of previous anesthesia was reviewed.                        - The risks and benefits of the procedure and the                         sedation options and risks were discussed with the                         patient. All questions were answered and informed                         consent was obtained.                        - ASA Grade Assessment: II - A patient with mild                         systemic disease.                        After obtaining informed consent, the colonoscope was                         passed under direct vision. Throughout the procedure,                         the patient's blood pressure, pulse, and oxygen                         saturations were monitored continuously. The                         Colonoscope was introduced through the anus and  advanced to the the cecum, identified by the                         appendiceal orifice. The colonoscopy was performed                         with ease. The patient tolerated the procedure well.                         The quality of the bowel  preparation was excellent. Findings:      The perianal and digital rectal examinations were normal.      Non-bleeding internal hemorrhoids were found during retroflexion. The       hemorrhoids were large and Grade I (internal hemorrhoids that do not       prolapse).      A 3 mm polyp was found in the proximal ascending colon. The polyp was       sessile. The polyp was removed with a cold biopsy forceps. Resection and       retrieval were complete.      The exam was otherwise without abnormality on direct and retroflexion       views. Impression:            - Non-bleeding internal hemorrhoids.                        - One 3 mm polyp in the proximal ascending colon,                         removed with a cold biopsy forceps. Resected and                         retrieved.                        - The examination was otherwise normal on direct and                         retroflexion views. Recommendation:        - Discharge patient to home (with escort).                        - Resume previous diet.                        - Continue present medications.                        - Await pathology results.                        - Repeat colonoscopy for surveillance based on                         pathology results.                        - Return to GI office as previously scheduled. Procedure Code(s):     --- Professional ---  45380, Colonoscopy, flexible; with biopsy, single or                         multiple Diagnosis Code(s):     --- Professional ---                        K63.5, Polyp of colon                        K64.0, First degree hemorrhoids                        R10.9, Unspecified abdominal pain CPT copyright 2019 American Medical Association. All rights reserved. The codes documented in this report are preliminary and upon coder review may  be revised to meet current compliance requirements. Jonathon Bellows, MD Jonathon Bellows MD, MD 08/10/2019 8:34:55  AM This report has been signed electronically. Number of Addenda: 0 Note Initiated On: 08/10/2019 8:04 AM Scope Withdrawal Time: 0 hours 10 minutes 21 seconds  Total Procedure Duration: 0 hours 15 minutes 51 seconds  Estimated Blood Loss:  Estimated blood loss: none.      [facility]

## 2019-08-10 NOTE — Transfer of Care (Signed)
Immediate Anesthesia Transfer of Care Note  Patient: Kendra Allison  Procedure(s) Performed: COLONOSCOPY WITH PROPOFOL (N/A ) ESOPHAGOGASTRODUODENOSCOPY (EGD) WITH PROPOFOL (N/A )  Patient Location: PACU  Anesthesia Type:General  Level of Consciousness: drowsy  Airway & Oxygen Therapy: Patient Spontanous Breathing  Post-op Assessment: Report given to RN and Post -op Vital signs reviewed and stable  Post vital signs: Reviewed and stable  Last Vitals:  Vitals Value Taken Time  BP 116/71 08/10/19 0837  Temp 36.3 C 08/10/19 0837  Pulse 78 08/10/19 0838  Resp 20 08/10/19 0838  SpO2 97 % 08/10/19 0838  Vitals shown include unvalidated device data.  Last Pain:  Vitals:   08/10/19 0718  TempSrc: Temporal         Complications: No apparent anesthesia complications

## 2019-08-10 NOTE — Op Note (Signed)
[facility] Gastroenterology Patient Name: Kendra Allison Procedure Date: 08/10/2019 8:05 AM MRN: [id number] Account #: [id number] Date of Birth: 15-Oct-1955 Admit Type: Outpatient Age: 64 Room: [facility] ENDO ROOM 1 Gender: Female Note Status: Finalized Procedure:             Upper GI endoscopy Indications:           Abdominal pain Providers:             Jonathon Bellows MD, MD Referring MD:          Olin Hauser (Referring MD) Medicines:             Monitored Anesthesia Care Complications:         No immediate complications. Procedure:             Pre-Anesthesia Assessment:                        - Prior to the procedure, a History and Physical was                         performed, and patient medications, allergies and                         sensitivities were reviewed. The patient's tolerance                         of previous anesthesia was reviewed.                        - The risks and benefits of the procedure and the                         sedation options and risks were discussed with the                         patient. All questions were answered and informed                         consent was obtained.                        - ASA Grade Assessment: II - A patient with mild                         systemic disease.                        After obtaining informed consent, the endoscope was                         passed under direct vision. Throughout the procedure,                         the patient's blood pressure, pulse, and oxygen                         saturations were monitored continuously. The Endoscope                         was introduced through the mouth, and advanced  to the                         third part of duodenum. The upper GI endoscopy was                         accomplished with ease. The patient tolerated the                         procedure well. Findings:      The esophagus was normal.      The examined duodenum  was normal.      The entire examined stomach was normal. Biopsies were taken with a cold       forceps for histology.      The cardia and gastric fundus were normal on retroflexion. Impression:            - Normal esophagus.                        - Normal examined duodenum.                        - Normal stomach. Biopsied. Recommendation:        - Await pathology results.                        - Perform a colonoscopy today. Procedure Code(s):     --- Professional ---                        [phone], Esophagogastroduodenoscopy, flexible,                         transoral; with biopsy, single or multiple Diagnosis Code(s):     --- Professional ---                        R10.9, Unspecified abdominal pain CPT copyright 2019 American Medical Association. All rights reserved. The codes documented in this report are preliminary and upon coder review may  be revised to meet current compliance requirements. Jonathon Bellows, MD Jonathon Bellows MD, MD 08/10/2019 8:15:32 AM This report has been signed electronically. Number of Addenda: 0 Note Initiated On: 08/10/2019 8:05 AM Estimated Blood Loss:  Estimated blood loss: none.      [facility]

## 2019-08-11 ENCOUNTER — Encounter: Payer: Self-pay | Admitting: *Deleted

## 2019-08-11 LAB — SURGICAL PATHOLOGY

## 2019-08-14 ENCOUNTER — Encounter: Payer: Self-pay | Admitting: Gastroenterology

## 2019-08-17 ENCOUNTER — Other Ambulatory Visit: Payer: Self-pay

## 2019-08-17 ENCOUNTER — Telehealth: Payer: Self-pay

## 2019-08-17 MED ORDER — LINACLOTIDE 145 MCG PO CAPS: 145.0000 ug | ORAL_CAPSULE | Freq: Every day | ORAL | 1 refills | Status: DC

## 2019-08-17 NOTE — Telephone Encounter (Signed)
Pt called and stated that after her procedure last week Dr. Vicente Males mentioned trying a new medication. Pt couldn't recall the name of the medication but wants to know if we will send the prescription to her pharmacy?

## 2019-08-17 NOTE — Telephone Encounter (Signed)
If constipation not responmded to miralax then start linzess 145 - give samples and ask her to inform us in 2 weeks how she is doing

## 2019-08-17 NOTE — Telephone Encounter (Signed)
Spoke with Kendra Allison and informed her that the medication Dr. Vicente Males mentioned is the Linzess 145 mcg, which Kendra Allison has already started a trial of the Linzess. Kendra Allison states the Linzess 145 mcg has been working and wants to proceed with the prescription.

## 2019-08-23 ENCOUNTER — Ambulatory Visit: Payer: Federal, State, Local not specified - PPO | Admitting: Gastroenterology

## 2019-08-23 ENCOUNTER — Other Ambulatory Visit: Payer: Self-pay

## 2019-08-23 VITALS — BP 182/97 | HR 67 | Temp 98.6°F | Ht 64.0 in | Wt 181.8 lb

## 2019-08-23 DIAGNOSIS — K581 Irritable bowel syndrome with constipation: Secondary | ICD-10-CM | POA: Diagnosis not present

## 2019-08-23 NOTE — Progress Notes (Signed)
Jonathon Bellows MD, MRCP(U.K) [address]  [place]  [place], [place] [address]  Main: [phone]  Fax: [phone]   Primary Care Physician: Olin Hauser, DO  Primary Gastroenterologist:  Dr. Jonathon Bellows   Follow-up for abdominal pain likely due to constipation, long-term NSAID   HPI: Arlicia Multer is a 64 y.o. female   Summary of history :  She was initially referred and seen on 07/03/2019 for abdominal pain and GERD.Treated for H. pylori in the past with clarithromycin based therapy last.  03/16/2019: CMP normal, CBC normal.  Last colonoscopy was in 2018 in [place].  I have reviewed her report and she does not require a repeat colonoscopy for 10 years.  Abdominal pain since October 2020.  Gradually getting worse.  Usually occurs right after she eats any type of food.  Last for a long time.  Relieved with a good bowel movement.  She does not have a bowel movement every day.  Probably 2-3 times a week.  Very hard.  Has taken fiber pills has not helped.  She recalls when she was treated for H. pylori the abdominal pain improved but gradually got worse afterwards.  Does complain of abdominal pain with associated with distention and bloating.  She has not tried anything for constipation.  She has been with taking Prilosec but has been taking it with meals or after meals.  No weight loss.  She has been taking Aleve every day to help with her sleep  Interval history   07/03/2019-08/23/2019  07/03/2019: H. pylori breath test: Negative 07/19/2019: CT scan of the abdomen and pelvis with contrast: Moderate colonic stool burden. 08/10/2019 EGD:, Normal, colonoscopy: 3 mm polyp resected otherwise normal colonoscopy with internal hemorrhoids only.  Biopsies stomach showed mild reactive hyperplasia.  Colon polyp was a tubular adenoma.  Commenced on Linzess 145 mcg.  Worked very well had no abdominal pain.  Trulance caused severe diarrhea.  She ran out of her samples and is awaiting  preauthorization on her Linzess.  In the interim she gets postprandial pain in the epigastric area associated a lot of cramping and bloating relieved after good bowel movement.  Taking her Prilosec daily no symptoms of reflux.  Current Outpatient Medications  Medication Sig Dispense Refill  . alendronate (FOSAMAX) 70 MG tablet Take 1 tablet (70 mg total) by mouth once a week. Take with a full glass of water on an empty stomach. 12 tablet 3  . Azelastine-Fluticasone 137-50 MCG/ACT SUSP Place into the nose.    . clarithromycin (BIAXIN) 500 MG tablet Take 1 tablet (500 mg total) by mouth 2 (two) times daily. 28 tablet 0  . estradiol (ESTRACE) 2 MG tablet Take 1 tablet (2 mg total) by mouth daily. 90 tablet 3  . linaclotide (LINZESS) 145 MCG CAPS capsule Take 1 capsule (145 mcg total) by mouth daily before breakfast. 90 capsule 1  . losartan (COZAAR) 50 MG tablet Take 1 tablet (50 mg total) by mouth daily. 90 tablet 1  . metroNIDAZOLE (FLAGYL) 500 MG tablet Take 1 tablet (500 mg total) by mouth 3 (three) times daily. Do not drink alcohol while taking this medicine. 42 tablet 0  . montelukast (SINGULAIR) 10 MG tablet Take 10 mg by mouth at bedtime.    Marland Kitchen omeprazole (PRILOSEC) 40 MG capsule Take one pill 40mg  twice a day before meals for 2 weeks, then reduce to once daily 42 capsule 0  . omeprazole (PRILOSEC) 40 MG capsule Take 1 capsule (40 mg total)  by mouth daily. 90 capsule 0  . rosuvastatin (CRESTOR) 10 MG tablet Take 1 tablet (10 mg total) by mouth daily. 90 tablet 3  . verapamil (CALAN-SR) 240 MG CR tablet Take 240 mg by mouth at bedtime.     No current facility-administered medications for this visit.    Allergies as of 08/23/2019  . (No Known Allergies)    ROS:  General: Negative for anorexia, weight loss, fever, chills, fatigue, weakness. ENT: Negative for hoarseness, difficulty swallowing , nasal congestion. CV: Negative for chest pain, angina, palpitations, dyspnea on exertion,  peripheral edema.  Respiratory: Negative for dyspnea at rest, dyspnea on exertion, cough, sputum, wheezing.  GI: See history of present illness. GU:  Negative for dysuria, hematuria, urinary incontinence, urinary frequency, nocturnal urination.  Endo: Negative for unusual weight change.    Physical Examination:   There were no vitals taken for this visit.  General: Well-nourished, well-developed in no acute distress.  Eyes: No icterus. Conjunctivae pink. Abdomen: Bowel sounds are normal, nontender, nondistended, no hepatosplenomegaly or masses, no abdominal bruits or hernia , no rebound or guarding.   Extremities: No lower extremity edema. No clubbing or deformities. Neuro: Alert and oriented x 3.  Grossly intact. Skin: Warm and dry, no jaundice.   Psych: Alert and cooperative, normal mood and affect.   Imaging Studies: No results found.  Assessment and Plan:   Julliana Benish is a 64 y.o. y/o female here to follow-up for abdominal pain likely secondary to IBS constipation.  EGD and colonoscopy were negative.  Plan 1.    Linzess has worked well continue Linzess 145 mcg daily.  We are working on the preauthorization.  We will provide a sample for 3 weeks. 2.  Advised her to return to my office if symptoms recur or do not respond appropriately then will consider evaluation the gallbladder. 3.  Since she has not had any significant benefit on the PPI and there was no evidence of esophagitis seen on the endoscopy I have advised her to stop her PPI and see how she does.  Dr Jonathon Bellows  MD,MRCP [facility]) Follow up in as needed

## 2019-10-02 ENCOUNTER — Other Ambulatory Visit: Payer: Self-pay

## 2019-10-02 ENCOUNTER — Ambulatory Visit (INDEPENDENT_AMBULATORY_CARE_PROVIDER_SITE_OTHER): Payer: Federal, State, Local not specified - PPO | Admitting: Family Medicine

## 2019-10-02 ENCOUNTER — Encounter: Payer: Self-pay | Admitting: Family Medicine

## 2019-10-02 VITALS — BP 169/91 | HR 71 | Temp 97.3°F | Resp 16 | Ht 64.0 in | Wt 183.6 lb

## 2019-10-02 DIAGNOSIS — H6982 Other specified disorders of Eustachian tube, left ear: Secondary | ICD-10-CM

## 2019-10-02 DIAGNOSIS — I10 Essential (primary) hypertension: Secondary | ICD-10-CM

## 2019-10-02 DIAGNOSIS — J321 Chronic frontal sinusitis: Secondary | ICD-10-CM | POA: Diagnosis not present

## 2019-10-02 DIAGNOSIS — R0609 Other forms of dyspnea: Secondary | ICD-10-CM

## 2019-10-02 DIAGNOSIS — F5101 Primary insomnia: Secondary | ICD-10-CM

## 2019-10-02 MED ORDER — HYDROXYZINE HCL 25 MG PO TABS: 25.0000 mg | ORAL_TABLET | Freq: Every evening | ORAL | 2 refills | Status: DC | PRN

## 2019-10-02 MED ORDER — AZELASTINE-FLUTICASONE 137-50 MCG/ACT NA SUSP: 2.0000 | Freq: Every day | NASAL | 3 refills | Status: DC

## 2019-10-02 MED ORDER — AMLODIPINE BESYLATE 10 MG PO TABS: 10.0000 mg | ORAL_TABLET | Freq: Every day | ORAL | 5 refills | Status: DC

## 2019-10-02 MED ORDER — PREDNISONE 50 MG PO TABS: 50.0000 mg | ORAL_TABLET | Freq: Every day | ORAL | 0 refills | Status: DC

## 2019-10-02 NOTE — Patient Instructions (Addendum)
Thank you for coming to the office today.  Start on Amlodipine for blood pressure can do half pill or whole pill every day - let me know if you need me to [facility]  Refilled nasal spray  Try Hydroxyzine as needed for sleep, if wake up cannot fall back asleep can take one or half if need.  Steroid prednisone for 5 days for breathing. If need a longer term inhaler we can send that in as well.  Referral to ENT. [place] - stay tuned for apt  DUE for FASTING BLOOD WORK (no food or drink after midnight before the lab appointment, only water or coffee without cream/sugar on the morning of)  SCHEDULE "Lab Only" visit in the morning at the clinic for lab draw in 1 WEEK  - Make sure Lab Only appointment is at about 1 week before your next appointment, so that results will be available  For Lab Results, once available within 2-3 days of blood draw, you can can log in to MyChart online to view your results and a brief explanation. Also, we can discuss results at next follow-up visit.   Please schedule a Follow-up Appointment to: Return in about 3 months (around 01/02/2020) for 3 month HTN, Sleep, Sinus.  If you have any other questions or concerns, please feel free to call the office or send a message through [place]. You may also schedule an earlier appointment if necessary.  Additionally, you may be receiving a survey about your experience at our office within a few days to 1 week by e-mail or mail. We value your feedback.  Nobie Putnam, DO [place]

## 2019-10-02 NOTE — Progress Notes (Signed)
Subjective:    Patient ID: Kendra Allison, female    DOB: Aug 10, 1955, 64 y.o.   MRN: [id number]  Kendra Allison is a 64 y.o. female presenting on 10/02/2019 for Ear Pain (SOB after getting covid vaccine --last 09/20/2019, B/P meds not working for her, referral to ENT --ear pain Left side) and Hypertension   HPI   GERD / History of positive H Pylori Recently 05/2019 treated for GERD and empiric H Pylori with antibiotic course, [place]. She was started on BID PPI and other symptomatic relief meds, referred to GI. She has established with them had CT Abdomen, Colonoscopy, Upper endoscopy - Ultimately EGD was unremarkable. No H Pylori or Gastritis/GERD - There was question for a oropharyngeal dysphagia or symptoms from her chronic sinuses were affecting her GERD or digestion problem. She was asked to see ENT next by the GI provider.  Chronic sinusitis / Eustachian tube dysfunction Reports chronic sinus pressure, drainage, congestion, ear fullness and pressure pain, left is worse Throat and Nose Feel Dry all the time Felt like ears clogged, causing her to talk louder Requesting referral to ENT Has azelastine/Fluticasone, but was not using regularly, request re order, has singulair.  HTN On Losartan 50mg  daily, Verapamil. Missed Losartan dosing. Request additional medication.  Dyspnea / Chest tightness Breathing difficulty waking up at night. She has history of treatment for asthma in past, associated with her breathing. Has Albuterol PRN. Rescue inhaler. Limited relief. Asking about "Budesonide" but wanted pill form like prednisone in past. Not interested in maintenance ICS  Asking about blood work - would like to return for blood panel  Insomnia Tried Melatonin if wake up. Sleep for 2 hours, wake up, not having any issue with breathing or pain - >5-6 months now with unable to maintain her sleep. If wakes back up.  Health Maintenance: UTD COVID19 vaccine  series.   Depression screen [facility] 2/9 10/02/2019 03/23/2019 03/01/2019  Decreased Interest 0 0 0  Down, Depressed, Hopeless 0 0 0  PHQ - 2 Score 0 0 0  Altered sleeping - 0 0  Tired, decreased energy - 0 3  Change in appetite - 0 0  Feeling bad or failure about yourself  - 0 0  Trouble concentrating - 0 0  Moving slowly or fidgety/restless - 0 0  Suicidal thoughts - 0 0  PHQ-9 Score - 0 3  Difficult doing work/chores - Not difficult at all Not difficult at all    Social History   Tobacco Use  . Smoking status: Former Smoker    Types: Cigarettes  . Smokeless tobacco: Never Used  . Tobacco comment: Quit 40years ago.   Substance Use Topics  . Alcohol use: Yes  . Drug use: Never    Review of Systems Per HPI unless specifically indicated above     Objective:    BP (!) 169/91   Pulse 71   Temp (!) 97.3 F (36.3 C) (Temporal)   Resp 16   Ht 5\' 4"  (1.626 m)   Wt 183 lb 9.6 oz (83.3 kg)   SpO2 100%   BMI 31.51 kg/m   Wt Readings from Last 3 Encounters:  10/02/19 183 lb 9.6 oz (83.3 kg)  08/23/19 181 lb 12.8 oz (82.5 kg)  08/10/19 180 lb (81.6 kg)    Physical Exam Vitals and nursing note reviewed.  Constitutional:      General: She is not in acute distress.    Appearance: She is well-developed. She  is not diaphoretic.     Comments: Well-appearing, comfortable, cooperative  HENT:     Head: Normocephalic and atraumatic.     Right Ear: Tympanic membrane, ear canal and external ear normal.     Left Ear: External ear normal.     Ears:     Comments: Left TM fullness bulging, no erythema or purulence. Some clear effusion Eyes:     General:        Right eye: No discharge.        Left eye: No discharge.     Conjunctiva/sclera: Conjunctivae normal.  Neck:     Thyroid: No thyromegaly.  Cardiovascular:     Rate and Rhythm: Normal rate and regular rhythm.     Heart sounds: Normal heart sounds. No murmur.  Pulmonary:     Effort: Pulmonary effort is normal. No respiratory  distress.     Breath sounds: Normal breath sounds. No wheezing or rales.  Musculoskeletal:        General: Normal range of motion.     Cervical back: Normal range of motion and neck supple.  Lymphadenopathy:     Cervical: No cervical adenopathy.  Skin:    General: Skin is warm and dry.     Findings: No erythema or rash.  Neurological:     Mental Status: She is alert and oriented to person, place, and time.  Psychiatric:        Behavior: Behavior normal.     Comments: Well groomed, good eye contact, normal speech and thoughts       Results for orders placed or performed during the hospital encounter of 08/10/19  Surgical pathology  Result Value Ref Range   SURGICAL PATHOLOGY      SURGICAL PATHOLOGY CASE: [id number] PATIENT: [facility] Surgical Pathology Report     Specimen Submitted: A. Stomach; cbx B. Colon polyp, ascending; cbx  Clinical History: Abdominal pain, unspecified abdominal location R10.9, Abdominal distension (gaseous) R14.0. Findings: Normal EGD; ascending colon polyp.     DIAGNOSIS: A.  STOMACH; COLD BIOPSY: - ANTRAL AND TRANSITIONAL MUCOSA WITH MILD REACTIVE FOVEOLAR HYPERPLASIA. - NEGATIVE FOR H. PYLORI, INTESTINAL METAPLASIA, DYSPLASIA, AND MALIGNANCY.  B.  COLON POLYP, ASCENDING; COLD BIOPSY: - TUBULAR ADENOMA. - NEGATIVE FOR HIGH-GRADE DYSPLASIA AND MALIGNANCY.  GROSS DESCRIPTION: A. Labeled: Gastric C BXs rule out gastritis Received: Formalin Tissue fragment(s): 3 Size: 0.2-0.3 cm Description: Tan soft tissue fragments Entirely submitted in 1 cassette.  B. Labeled: Ascending colon polyp C BX Received: Formalin Tissue fragment(s): 1 Size: 0.3 cm Description: Tan soft tissue fragment Entirely  submitted in 1 cassette.    Final Diagnosis performed by Bryan Lemma, MD.   Electronically signed 08/11/2019 4:19:22PM The electronic signature indicates that the named Attending Pathologist has evaluated the specimen Technical  component performed at [facility], [address], [place], [place] [address] Lab: [id number] Dir: Rush Farmer, MD, MMM  Professional component performed at [facility], [facility], [place], [place], [place] [address] Lab: [phone] Dir: Dellia Nims. Rubinas, MD       Assessment & Plan:   Problem List Items Addressed This Visit    Essential hypertension - Primary    Mildly elevated initial BP - suboptimal control - Home BP readings [id number] No known complications    Plan:  1. ADD new med Amlodipine 10mg  daily - counseling on new start med, may initiate with half dose at 5mg  if preferred. 2. Continue Verapamil 240mg  daily, RESTART Losartan 50mg  3. Encourage improved lifestyle - low  sodium diet, regular exercise 4. Continue monitor BP outside office, bring readings to next visit, if persistently >140/90 or new symptoms notify office sooner      Relevant Medications   amLODipine (NORVASC) 10 MG tablet    Other Visit Diagnoses    Other form of dyspnea       Relevant Medications   predniSONE (DELTASONE) 50 MG tablet   Chronic frontal sinusitis       Relevant Medications   predniSONE (DELTASONE) 50 MG tablet   Azelastine-Fluticasone 137-50 MCG/ACT SUSP   Other Relevant Orders   Ambulatory referral to ENT   Primary insomnia       Relevant Medications   hydrOXYzine (ATARAX/VISTARIL) 25 MG tablet   Eustachian tube dysfunction, left       Relevant Orders   Ambulatory referral to ENT      #Insomnia Chronic problem, sleep maintenance, seems constellation of symptoms with sinuses and breathing affecting her sleep right now. - Mutually agree to avoid long term sleep aid. Will trial Hydroxyzine PRN  #Eustachian Tube Dysfunction Left / Chronic sinusitis Limited improvement Referral to ENT Already on Azelastine/Fluticaone. Singulair  #Dyspnea vs Chest Tightness Seems related to underlying sleep and sinus issue No identified acute gastritis or GERD on EGD, GI  has treated her now requested ENT consult evaluation of laryngopharyngeal symptoms as well. - Lungs clear today without wheezing - Given course of symptoms and similar prior history - may be underlying asthma related but not confirmed - Offered inhaled  ICS / steroid initially but she declined and requested pills - Trial Prednisone burst 50mg  daily x 5 days - Use rescue inhaler albuterol PRN - Consider Symbicort or other maintenance if needed   Meds ordered this encounter  Medications  . predniSONE (DELTASONE) 50 MG tablet    Sig: Take 1 tablet (50 mg total) by mouth daily with breakfast.    Dispense:  5 tablet    Refill:  0  . Azelastine-Fluticasone 137-50 MCG/ACT SUSP    Sig: Place 2 sprays into the nose daily.    Dispense:  23 g    Refill:  3  . hydrOXYzine (ATARAX/VISTARIL) 25 MG tablet    Sig: Take 1 tablet (25 mg total) by mouth at bedtime as needed (insomnia).    Dispense:  30 tablet    Refill:  2  . amLODipine (NORVASC) 10 MG tablet    Sig: Take 1 tablet (10 mg total) by mouth daily.    Dispense:  30 tablet    Refill:  5   Orders Placed This Encounter  Procedures  . Ambulatory referral to ENT    Referral Priority:   Routine    Referral Type:   Consultation    Referral Reason:   Specialty Services Required    Requested Specialty:   Otolaryngology    Number of Visits Requested:   1     Follow up plan: Return in about 3 months (around 01/02/2020) for 3 month HTN, Sleep, Sinus.  Future labs ordered for next week  Nobie Putnam, [facility] 10/02/2019, 11:58 AM

## 2019-10-03 ENCOUNTER — Other Ambulatory Visit: Payer: Self-pay | Admitting: Family Medicine

## 2019-10-03 DIAGNOSIS — I10 Essential (primary) hypertension: Secondary | ICD-10-CM

## 2019-10-03 DIAGNOSIS — R7309 Other abnormal glucose: Secondary | ICD-10-CM

## 2019-10-03 DIAGNOSIS — E7800 Pure hypercholesterolemia, unspecified: Secondary | ICD-10-CM

## 2019-10-03 DIAGNOSIS — D509 Iron deficiency anemia, unspecified: Secondary | ICD-10-CM

## 2019-10-03 NOTE — Assessment & Plan Note (Signed)
Mildly elevated initial BP - suboptimal control - Home BP readings [id number] No known complications    Plan:  1. ADD new med Amlodipine 10mg  daily - counseling on new start med, may initiate with half dose at 5mg  if preferred. 2. Continue Verapamil 240mg  daily, RESTART Losartan 50mg  3. Encourage improved lifestyle - low sodium diet, regular exercise 4. Continue monitor BP outside office, bring readings to next visit, if persistently >140/90 or new symptoms notify office sooner

## 2019-10-04 DIAGNOSIS — H9202 Otalgia, left ear: Secondary | ICD-10-CM | POA: Diagnosis not present

## 2019-10-04 DIAGNOSIS — J309 Allergic rhinitis, unspecified: Secondary | ICD-10-CM | POA: Diagnosis not present

## 2019-10-04 DIAGNOSIS — H6980 Other specified disorders of Eustachian tube, unspecified ear: Secondary | ICD-10-CM | POA: Diagnosis not present

## 2019-10-04 DIAGNOSIS — K219 Gastro-esophageal reflux disease without esophagitis: Secondary | ICD-10-CM | POA: Diagnosis not present

## 2019-10-04 DIAGNOSIS — H9209 Otalgia, unspecified ear: Secondary | ICD-10-CM | POA: Diagnosis not present

## 2019-10-04 DIAGNOSIS — R682 Dry mouth, unspecified: Secondary | ICD-10-CM | POA: Diagnosis not present

## 2019-10-10 ENCOUNTER — Other Ambulatory Visit: Payer: Self-pay

## 2019-10-10 DIAGNOSIS — I10 Essential (primary) hypertension: Secondary | ICD-10-CM

## 2019-10-10 DIAGNOSIS — E7800 Pure hypercholesterolemia, unspecified: Secondary | ICD-10-CM | POA: Diagnosis not present

## 2019-10-10 DIAGNOSIS — R7309 Other abnormal glucose: Secondary | ICD-10-CM | POA: Diagnosis not present

## 2019-10-10 DIAGNOSIS — D509 Iron deficiency anemia, unspecified: Secondary | ICD-10-CM

## 2019-10-11 ENCOUNTER — Other Ambulatory Visit: Payer: Self-pay | Admitting: Family Medicine

## 2019-10-11 DIAGNOSIS — I10 Essential (primary) hypertension: Secondary | ICD-10-CM

## 2019-10-11 DIAGNOSIS — I517 Cardiomegaly: Secondary | ICD-10-CM

## 2019-10-11 LAB — COMPLETE METABOLIC PANEL WITH GFR
AG Ratio: 2 (calc) (ref 1.0–2.5)
ALT: 19 U/L (ref 6–29)
AST: 17 U/L (ref 10–35)
Albumin: 4.4 g/dL (ref 3.6–5.1)
Alkaline phosphatase (APISO): 45 U/L (ref 37–153)
BUN/Creatinine Ratio: 15 (calc) (ref 6–22)
BUN: 16 mg/dL (ref 7–25)
CO2: 31 mmol/L (ref 20–32)
Calcium: 9.6 mg/dL (ref 8.6–10.4)
Chloride: 103 mmol/L (ref 98–110)
Creat: 1.09 mg/dL — ABNORMAL HIGH (ref 0.50–0.99)
GFR, Est African American: 63 mL/min/{1.73_m2} (ref >=60)
GFR, Est Non African American: 54 mL/min/{1.73_m2} — ABNORMAL LOW (ref >=60)
Globulin: 2.2 g/dL (calc) (ref 1.9–3.7)
Glucose, Bld: 98 mg/dL (ref 65–99)
Potassium: 4.6 mmol/L (ref 3.5–5.3)
Sodium: 138 mmol/L (ref 135–146)
Total Bilirubin: 0.6 mg/dL (ref 0.2–1.2)
Total Protein: 6.6 g/dL (ref 6.1–8.1)

## 2019-10-11 LAB — CBC WITH DIFFERENTIAL/PLATELET
Absolute Monocytes: 376 cells/uL (ref 200–950)
Basophils Absolute: 61 cells/uL (ref 0–200)
Basophils Relative: 1.3 %
Eosinophils Absolute: 141 cells/uL (ref 15–500)
Eosinophils Relative: 3 %
HCT: 39.7 % (ref 35.0–45.0)
Hemoglobin: 13.1 g/dL (ref 11.7–15.5)
Lymphs Abs: 1871 cells/uL (ref 850–3900)
MCH: 29.9 pg (ref 27.0–33.0)
MCHC: 33 g/dL (ref 32.0–36.0)
MCV: 90.6 fL (ref 80.0–100.0)
MPV: 10 fL (ref 7.5–12.5)
Monocytes Relative: 8 %
Neutro Abs: 2251 cells/uL (ref 1500–7800)
Neutrophils Relative %: 47.9 %
Platelets: 336 10*3/uL (ref 140–400)
RBC: 4.38 10*6/uL (ref 3.80–5.10)
RDW: 12.4 % (ref 11.0–15.0)
Total Lymphocyte: 39.8 %
WBC: 4.7 10*3/uL (ref 3.8–10.8)

## 2019-10-11 LAB — LIPID PANEL
Cholesterol: 103 mg/dL (ref <200)
HDL: 51 mg/dL (ref >=50)
LDL Cholesterol (Calc): 38 mg/dL (calc)
Non-HDL Cholesterol (Calc): 52 mg/dL (calc) (ref <130)
Total CHOL/HDL Ratio: 2 (calc) (ref <5.0)
Triglycerides: 55 mg/dL (ref <150)

## 2019-10-11 LAB — HEMOGLOBIN A1C
Hgb A1c MFr Bld: 5.2 % of total Hgb (ref <5.7)
Mean Plasma Glucose: 103 (calc)
eAG (mmol/L): 5.7 (calc)

## 2019-10-11 LAB — TSH: TSH: 1.78 mIU/L (ref 0.40–4.50)

## 2019-10-11 MED ORDER — VERAPAMIL HCL ER 240 MG PO TBCR: 240.0000 mg | EXTENDED_RELEASE_TABLET | Freq: Every day | ORAL | 1 refills | Status: DC

## 2019-10-24 DIAGNOSIS — J301 Allergic rhinitis due to pollen: Secondary | ICD-10-CM | POA: Diagnosis not present

## 2019-10-25 DIAGNOSIS — J309 Allergic rhinitis, unspecified: Secondary | ICD-10-CM | POA: Diagnosis not present

## 2019-10-25 DIAGNOSIS — R0981 Nasal congestion: Secondary | ICD-10-CM | POA: Diagnosis not present

## 2019-10-25 DIAGNOSIS — H6980 Other specified disorders of Eustachian tube, unspecified ear: Secondary | ICD-10-CM | POA: Diagnosis not present

## 2019-10-25 DIAGNOSIS — K219 Gastro-esophageal reflux disease without esophagitis: Secondary | ICD-10-CM | POA: Diagnosis not present

## 2019-10-26 ENCOUNTER — Other Ambulatory Visit: Payer: Self-pay | Admitting: Family Medicine

## 2019-10-26 DIAGNOSIS — I10 Essential (primary) hypertension: Secondary | ICD-10-CM

## 2019-10-26 NOTE — Telephone Encounter (Signed)
amLODipine (NORVASC) 10 MG tablet      Patient is requesting a refill.     Pharm[facility]:  [facility] [place] [place] to [address] [place] [address] Phone:  [phone]  Fax:  [phone]

## 2019-10-27 DIAGNOSIS — J301 Allergic rhinitis due to pollen: Secondary | ICD-10-CM | POA: Diagnosis not present

## 2019-10-30 ENCOUNTER — Other Ambulatory Visit: Payer: Self-pay | Admitting: Family Medicine

## 2019-10-30 DIAGNOSIS — I10 Essential (primary) hypertension: Secondary | ICD-10-CM

## 2019-10-30 NOTE — Telephone Encounter (Signed)
PT needs a refill  amLODipine (NORVASC) 10 MG tablet [[id number]] [facility] [place] [place] [facility] [address] [place] [address]  Phone: [phone] Fax: [phone]

## 2019-10-30 NOTE — Addendum Note (Signed)
Addended by: Linus Orn A on: 10/30/2019 02:50 PM   Modules accepted: Orders

## 2019-11-02 DIAGNOSIS — J301 Allergic rhinitis due to pollen: Secondary | ICD-10-CM | POA: Diagnosis not present

## 2019-11-06 DIAGNOSIS — J301 Allergic rhinitis due to pollen: Secondary | ICD-10-CM | POA: Diagnosis not present

## 2019-11-09 DIAGNOSIS — J301 Allergic rhinitis due to pollen: Secondary | ICD-10-CM | POA: Diagnosis not present

## 2019-11-13 DIAGNOSIS — J301 Allergic rhinitis due to pollen: Secondary | ICD-10-CM | POA: Diagnosis not present

## 2019-11-16 DIAGNOSIS — J301 Allergic rhinitis due to pollen: Secondary | ICD-10-CM | POA: Diagnosis not present

## 2019-11-17 DIAGNOSIS — J301 Allergic rhinitis due to pollen: Secondary | ICD-10-CM | POA: Diagnosis not present

## 2019-11-20 DIAGNOSIS — J301 Allergic rhinitis due to pollen: Secondary | ICD-10-CM | POA: Diagnosis not present

## 2019-11-23 DIAGNOSIS — J301 Allergic rhinitis due to pollen: Secondary | ICD-10-CM | POA: Diagnosis not present

## 2019-11-30 DIAGNOSIS — J301 Allergic rhinitis due to pollen: Secondary | ICD-10-CM | POA: Diagnosis not present

## 2019-12-04 DIAGNOSIS — J301 Allergic rhinitis due to pollen: Secondary | ICD-10-CM | POA: Diagnosis not present

## 2019-12-07 DIAGNOSIS — J301 Allergic rhinitis due to pollen: Secondary | ICD-10-CM | POA: Diagnosis not present

## 2019-12-08 DIAGNOSIS — J301 Allergic rhinitis due to pollen: Secondary | ICD-10-CM | POA: Diagnosis not present

## 2019-12-11 DIAGNOSIS — J301 Allergic rhinitis due to pollen: Secondary | ICD-10-CM | POA: Diagnosis not present

## 2019-12-14 DIAGNOSIS — J301 Allergic rhinitis due to pollen: Secondary | ICD-10-CM | POA: Diagnosis not present

## 2019-12-18 DIAGNOSIS — J301 Allergic rhinitis due to pollen: Secondary | ICD-10-CM | POA: Diagnosis not present

## 2019-12-21 DIAGNOSIS — J301 Allergic rhinitis due to pollen: Secondary | ICD-10-CM | POA: Diagnosis not present

## 2019-12-25 DIAGNOSIS — J301 Allergic rhinitis due to pollen: Secondary | ICD-10-CM | POA: Diagnosis not present

## 2019-12-28 DIAGNOSIS — J301 Allergic rhinitis due to pollen: Secondary | ICD-10-CM | POA: Diagnosis not present

## 2019-12-29 DIAGNOSIS — J301 Allergic rhinitis due to pollen: Secondary | ICD-10-CM | POA: Diagnosis not present

## 2020-01-01 DIAGNOSIS — J301 Allergic rhinitis due to pollen: Secondary | ICD-10-CM | POA: Diagnosis not present

## 2020-01-04 DIAGNOSIS — J301 Allergic rhinitis due to pollen: Secondary | ICD-10-CM | POA: Diagnosis not present

## 2020-01-08 DIAGNOSIS — J301 Allergic rhinitis due to pollen: Secondary | ICD-10-CM | POA: Diagnosis not present

## 2020-01-11 DIAGNOSIS — J301 Allergic rhinitis due to pollen: Secondary | ICD-10-CM | POA: Diagnosis not present

## 2020-01-15 DIAGNOSIS — J301 Allergic rhinitis due to pollen: Secondary | ICD-10-CM | POA: Diagnosis not present

## 2020-01-18 DIAGNOSIS — J301 Allergic rhinitis due to pollen: Secondary | ICD-10-CM | POA: Diagnosis not present

## 2020-01-22 DIAGNOSIS — J301 Allergic rhinitis due to pollen: Secondary | ICD-10-CM | POA: Diagnosis not present

## 2020-01-24 ENCOUNTER — Telehealth: Payer: Self-pay | Admitting: Family Medicine

## 2020-01-24 ENCOUNTER — Other Ambulatory Visit: Payer: Self-pay

## 2020-01-24 ENCOUNTER — Encounter: Payer: Self-pay | Admitting: Family Medicine

## 2020-01-24 ENCOUNTER — Ambulatory Visit: Payer: Federal, State, Local not specified - PPO | Admitting: Family Medicine

## 2020-01-24 ENCOUNTER — Other Ambulatory Visit: Payer: Self-pay | Admitting: Family Medicine

## 2020-01-24 VITALS — BP 145/80 | HR 70 | Temp 97.3°F | Resp 16 | Ht 64.0 in | Wt 180.0 lb

## 2020-01-24 DIAGNOSIS — J9801 Acute bronchospasm: Secondary | ICD-10-CM

## 2020-01-24 DIAGNOSIS — J302 Other seasonal allergic rhinitis: Secondary | ICD-10-CM

## 2020-01-24 DIAGNOSIS — L219 Seborrheic dermatitis, unspecified: Secondary | ICD-10-CM | POA: Diagnosis not present

## 2020-01-24 MED ORDER — ALBUTEROL SULFATE HFA 108 (90 BASE) MCG/ACT IN AERS: 2.0000 | INHALATION_SPRAY | RESPIRATORY_TRACT | 2 refills | Status: DC | PRN

## 2020-01-24 MED ORDER — MONTELUKAST SODIUM 10 MG PO TABS: 10.0000 mg | ORAL_TABLET | Freq: Every day | ORAL | 3 refills | Status: DC

## 2020-01-24 MED ORDER — TRIAMCINOLONE ACETONIDE 0.5 % EX CREA: 1.0000 "application " | TOPICAL_CREAM | Freq: Two times a day (BID) | CUTANEOUS | 1 refills | Status: DC

## 2020-01-24 NOTE — Patient Instructions (Addendum)
Thank you for coming to the office today.  Please schedule and return for a NURSE ONLY VISIT for VACCINE - Approximately 2-4 weeks September 2021 - Need Regular Dose Flu Vaccine  The rash looks most consistent with eczema, this can flare up and get worse due to a variety of factors (excessive dry skin from bathing/showering, soaps, cold weather / indoor heaters, outdoor exposures).  Use the topical steroid creams twice a day for up to 1 week, maximum duration of use per one flare is 10 to 14 days, then STOP using it and allow skin to recover. Caution with over-use may cause lightening of the skin.   Hydrocortisone on face only and the Triamcinolone / Kenalog on body only.  Tips to reduce Eczema Flares: For baths/showers, limit bathing to every other day if you can (max 1 x daily)  Use a gentle, unscented soap and lukewarm water (hot water is most irritating to skin) Never scrub skin with too much pressure, this causes more irritation. Pat skin dry, then leave it slightly damp. DO NOT scrub it dry. Apply steroid cream to skin and rub in all the way, wait 15 min, then apply a daily moisturizer (Vaseline, Eucerin, Aveeno). Continue daily moisturizer every day of the year (even after flare is resolved) - If you have eczema on hands or dry hands, recommend wearing any type of gloves overnight (cloth, fabric, or even nitrile/latex) to improve effect of topical moisturizer  If develops redness, honey colored crust oozing, drainage of pus, bleeding, or redness / swelling, pain, please return for re-evaluation, may have become infected after scratching.   Please schedule a Follow-up Appointment to: Return in about 2 weeks (around 02/07/2020), or if symptoms worsen or fail to improve, for rash.  If you have any other questions or concerns, please feel free to call the office or send a message through [place]. You may also schedule an earlier appointment if necessary.  Additionally, you may be  receiving a survey about your experience at our office within a few days to 1 week by e-mail or mail. We value your feedback.  Nobie Putnam, DO [place]

## 2020-01-24 NOTE — Telephone Encounter (Signed)
The albuterol inhaler is not at [facility] in [place] on [address]  pharm e-scribe transmission in progress it has not went through. Please resend. Pt was seen today

## 2020-01-24 NOTE — Telephone Encounter (Signed)
Resent. Confirmed they received it on my screen after submitting the new rx.  Nobie Putnam, DO [facility] 01/24/2020, 5:04 PM

## 2020-01-24 NOTE — Progress Notes (Signed)
Subjective:    Patient ID: Kendra Allison, female    DOB: Dec 24, 1955, 64 y.o.   MRN: [id number]  Kendra Allison is a 64 y.o. female presenting on 01/24/2020 for Rash (behind ears Right side onset couple of months but 2 months prior start spreading)   HPI   RASH Reports onset 1-2 month prior ago, started smaller, then seemed that it has increased and spreading, R side is worse behind ear and on neck, mostly itching and now it is also burning discomfort. Was also on LEFT side as well now that has RESOLVED. - Tried OTC cortisone cream, and OTC itch cream temporary relief - Followed by Allergist, and gets allergy immunotherapy shot twice a week now Denies any pain, drainage ulceration, bleeding pus, fever chills  Health Maintenance: Due flu vaccine, declines today cannot get within 24 hour of allergy shot, will return end of Sept  Depression screen [facility] 2/9 10/02/2019 03/23/2019 03/01/2019  Decreased Interest 0 0 0  Down, Depressed, Hopeless 0 0 0  PHQ - 2 Score 0 0 0  Altered sleeping - 0 0  Tired, decreased energy - 0 3  Change in appetite - 0 0  Feeling bad or failure about yourself  - 0 0  Trouble concentrating - 0 0  Moving slowly or fidgety/restless - 0 0  Suicidal thoughts - 0 0  PHQ-9 Score - 0 3  Difficult doing work/chores - Not difficult at all Not difficult at all    Social History   Tobacco Use  . Smoking status: Former Smoker    Types: Cigarettes  . Smokeless tobacco: Never Used  . Tobacco comment: Quit 40years ago.   Vaping Use  . Vaping Use: Never used  Substance Use Topics  . Alcohol use: Yes  . Drug use: Never    Review of Systems Per HPI unless specifically indicated above     Objective:    BP (!) 145/80   Pulse 70   Temp (!) 97.3 F (36.3 C) (Temporal)   Resp 16   Ht 5\' 4"  (1.626 m)   Wt 180 lb (81.6 kg)   SpO2 100%   BMI 30.90 kg/m   Wt Readings from Last 3 Encounters:  01/24/20 180 lb (81.6 kg)  10/02/19 183 lb 9.6 oz (83.3 kg)    08/23/19 181 lb 12.8 oz (82.5 kg)    Physical Exam Vitals and nursing note reviewed.  Constitutional:      General: She is not in acute distress.    Appearance: She is well-developed. She is not diaphoretic.     Comments: Well-appearing, comfortable, cooperative  HENT:     Head: Normocephalic and atraumatic.  Eyes:     General:        Right eye: No discharge.        Left eye: No discharge.     Conjunctiva/sclera: Conjunctivae normal.  Cardiovascular:     Rate and Rhythm: Normal rate.  Pulmonary:     Effort: Pulmonary effort is normal.  Skin:    General: Skin is warm and dry.     Findings: Rash (Right posterior neck/scalp within hairline has appearance of slightly raised rash non erythematous, no scaley lesions, no ulceration, only confined to hairline no urticaria) present. No erythema.  Neurological:     Mental Status: She is alert and oriented to person, place, and time.  Psychiatric:        Behavior: Behavior normal.     Comments: Well groomed, good eye contact, normal  speech and thoughts       Results for orders placed or performed in visit on 10/10/19  Hemoglobin A1c  Result Value Ref Range   Hgb A1c MFr Bld 5.2 <5.7 % of total Hgb   Mean Plasma Glucose 103 (calc)   eAG (mmol/L) 5.7 (calc)  TSH  Result Value Ref Range   TSH 1.78 0.40 - 4.50 mIU/L  Lipid panel  Result Value Ref Range   Cholesterol 103 <200 mg/dL   HDL 51 > OR = 50 mg/dL   Triglycerides 55 <150 mg/dL   LDL Cholesterol (Calc) 38 mg/dL (calc)   Total CHOL/HDL Ratio 2.0 <5.0 (calc)   Non-HDL Cholesterol (Calc) 52 <130 mg/dL (calc)  COMPLETE METABOLIC PANEL WITH GFR  Result Value Ref Range   Glucose, Bld 98 65 - 99 mg/dL   BUN 16 7 - 25 mg/dL   Creat 1.09 (H) 0.50 - 0.99 mg/dL   GFR, Est Non African American 54 (L) > OR = 60 mL/min/1.68m2   GFR, Est African American 63 > OR = 60 mL/min/1.38m2   BUN/Creatinine Ratio 15 6 - 22 (calc)   Sodium 138 135 - 146 mmol/L   Potassium 4.6 3.5 - 5.3  mmol/L   Chloride 103 98 - 110 mmol/L   CO2 31 20 - 32 mmol/L   Calcium 9.6 8.6 - 10.4 mg/dL   Total Protein 6.6 6.1 - 8.1 g/dL   Albumin 4.4 3.6 - 5.1 g/dL   Globulin 2.2 1.9 - 3.7 g/dL (calc)   AG Ratio 2.0 1.0 - 2.5 (calc)   Total Bilirubin 0.6 0.2 - 1.2 mg/dL   Alkaline phosphatase (APISO) 45 37 - 153 U/L   AST 17 10 - 35 U/L   ALT 19 6 - 29 U/L  CBC with Differential/Platelet  Result Value Ref Range   WBC 4.7 3.8 - 10.8 Thousand/uL   RBC 4.38 3.80 - 5.10 Million/uL   Hemoglobin 13.1 11.7 - 15.5 g/dL   HCT 39.7 35 - 45 %   MCV 90.6 80.0 - 100.0 fL   MCH 29.9 27.0 - 33.0 pg   MCHC 33.0 32.0 - 36.0 g/dL   RDW 12.4 11.0 - 15.0 %   Platelets 336 140 - 400 Thousand/uL   MPV 10.0 7.5 - 12.5 fL   Neutro Abs 2,251 1,500 - 7,800 cells/uL   Lymphs Abs 1,871 850 - 3,900 cells/uL   Absolute Monocytes 376 200 - 950 cells/uL   Eosinophils Absolute 141 15 - 500 cells/uL   Basophils Absolute 61 0 - 200 cells/uL   Neutrophils Relative % 47.9 %   Total Lymphocyte 39.8 %   Monocytes Relative 8.0 %   Eosinophils Relative 3.0 %   Basophils Relative 1.3 %      Assessment & Plan:   Problem List Items Addressed This Visit    None    Visit Diagnoses    Seborrheic dermatitis of scalp    -  Primary   Relevant Medications   triamcinolone cream (KENALOG) 0.5 %      Clinically with very localized rash R side posterior neck within hairline, has raised patchy rash appears similar to an eczema vs seb dermatitis rash of scalp - No ulceration, no erythema, no evidence of urticaria - Based on history was concerned for possible shingles with burning symptom but now unlikely given bilateral involvement and >2 month onset  Review limiting offending products or agents Trial on rx Topical Triamcinolone 0.5% cream BID for 2 weeks may repeat  Eczema handout AVS given Next option can consider Refer Dermatology, she can let us know if ready  Meds ordered this encounter  Medications  . triamcinolone  cream (KENALOG) 0.5 %    Sig: Apply 1 application topically 2 (two) times daily. For up to 2 weeks as needed can repeat.    Dispense:  30 g    Refill:  1      Follow up plan: Return in about 2 weeks (around 02/07/2020), or if symptoms worsen or fail to improve, for rash.   Nobie Putnam, DO [facility] 01/24/2020, 9:15 AM

## 2020-01-25 DIAGNOSIS — J301 Allergic rhinitis due to pollen: Secondary | ICD-10-CM | POA: Diagnosis not present

## 2020-02-01 DIAGNOSIS — J301 Allergic rhinitis due to pollen: Secondary | ICD-10-CM | POA: Diagnosis not present

## 2020-02-05 DIAGNOSIS — J301 Allergic rhinitis due to pollen: Secondary | ICD-10-CM | POA: Diagnosis not present

## 2020-02-08 DIAGNOSIS — J301 Allergic rhinitis due to pollen: Secondary | ICD-10-CM | POA: Diagnosis not present

## 2020-02-14 DIAGNOSIS — J301 Allergic rhinitis due to pollen: Secondary | ICD-10-CM | POA: Diagnosis not present

## 2020-02-16 ENCOUNTER — Telehealth: Payer: Self-pay | Admitting: Family Medicine

## 2020-02-16 NOTE — Telephone Encounter (Signed)
Pt saw dr Raliegh Ip on 01-24-2020 for a rash on her neck. Pt was given triamcinolone acetonide cream. Pt had little relief  And would like a referral to dermatologist. Pt would like something else call into [facility] in [place]. Please advise

## 2020-02-19 ENCOUNTER — Telehealth: Payer: Self-pay

## 2020-02-19 DIAGNOSIS — L219 Seborrheic dermatitis, unspecified: Secondary | ICD-10-CM

## 2020-02-19 MED ORDER — KETOCONAZOLE 2 % EX CREA: 1.0000 "application " | TOPICAL_CREAM | Freq: Two times a day (BID) | CUTANEOUS | 1 refills | Status: DC

## 2020-02-19 NOTE — Telephone Encounter (Signed)
Patient informed. 

## 2020-02-19 NOTE — Telephone Encounter (Signed)
Copied from [place] [id number]. Topic: Referral - Request for Referral >> Feb 16, 2020 10:01 AM Lennox Solders wrote: Has patient seen PCP for this complaint? Yes. Pt has a rash on back of her neck. Pt would like a referral to dermatologist . Pt has [employer]

## 2020-02-19 NOTE — Telephone Encounter (Signed)
Referral sent to [facility] topical cream Ketoconazole ordered - use on affected area of rash twice a day for up to 4 weeks.  Nobie Putnam, [facility] 02/19/2020, 12:24 PM

## 2020-02-19 NOTE — Telephone Encounter (Signed)
Duplicate phone message  Sent referral to [facility] dermatology and rx ketoconazole  Nobie Putnam, DO [facility] 02/19/2020, 12:25 PM

## 2020-02-21 DIAGNOSIS — J301 Allergic rhinitis due to pollen: Secondary | ICD-10-CM | POA: Diagnosis not present

## 2020-02-28 DIAGNOSIS — J301 Allergic rhinitis due to pollen: Secondary | ICD-10-CM | POA: Diagnosis not present

## 2020-02-29 DIAGNOSIS — L739 Follicular disorder, unspecified: Secondary | ICD-10-CM | POA: Diagnosis not present

## 2020-02-29 DIAGNOSIS — L723 Sebaceous cyst: Secondary | ICD-10-CM | POA: Diagnosis not present

## 2020-03-04 ENCOUNTER — Other Ambulatory Visit: Payer: Self-pay

## 2020-03-04 ENCOUNTER — Ambulatory Visit (INDEPENDENT_AMBULATORY_CARE_PROVIDER_SITE_OTHER): Payer: Federal, State, Local not specified - PPO

## 2020-03-04 DIAGNOSIS — Z23 Encounter for immunization: Secondary | ICD-10-CM | POA: Diagnosis not present

## 2020-03-06 DIAGNOSIS — J301 Allergic rhinitis due to pollen: Secondary | ICD-10-CM | POA: Diagnosis not present

## 2020-03-13 DIAGNOSIS — J301 Allergic rhinitis due to pollen: Secondary | ICD-10-CM | POA: Diagnosis not present

## 2020-03-16 ENCOUNTER — Other Ambulatory Visit: Payer: Self-pay | Admitting: Family Medicine

## 2020-03-16 DIAGNOSIS — I10 Essential (primary) hypertension: Secondary | ICD-10-CM

## 2020-03-16 NOTE — Telephone Encounter (Signed)
Requested Prescriptions  Pending Prescriptions Disp Refills  . amLODipine (NORVASC) 10 MG tablet [Pharmacy Med Name: AMLODIPINE BESYLATE 10MG  TABLETS] 90 tablet 1    Sig: TAKE 1 TABLET(10 MG) BY MOUTH DAILY     Cardiovascular:  Calcium Channel Blockers Failed - 03/16/2020  5:16 PM      Failed - Last BP in normal range    BP Readings from Last 1 Encounters:  01/24/20 (!) 145/80         Passed - Valid encounter within last 6 months    Recent Outpatient Visits          1 month ago Seborrheic dermatitis of scalp   [place], DO   5 months ago Essential hypertension   [place], DO   9 months ago H. pylori infection   [facility] Olin Hauser, DO   11 months ago Annual physical exam   [facility] Olin Hauser, DO   1 year ago Essential hypertension   [place], Devonne Doughty, DO      Future Appointments            In 1 month Laurence Ferrari, [place], [place]

## 2020-03-20 DIAGNOSIS — J301 Allergic rhinitis due to pollen: Secondary | ICD-10-CM | POA: Diagnosis not present

## 2020-03-22 DIAGNOSIS — J301 Allergic rhinitis due to pollen: Secondary | ICD-10-CM | POA: Diagnosis not present

## 2020-03-27 DIAGNOSIS — J301 Allergic rhinitis due to pollen: Secondary | ICD-10-CM | POA: Diagnosis not present

## 2020-03-31 ENCOUNTER — Other Ambulatory Visit: Payer: Self-pay | Admitting: Family Medicine

## 2020-03-31 DIAGNOSIS — E782 Mixed hyperlipidemia: Secondary | ICD-10-CM

## 2020-03-31 NOTE — Telephone Encounter (Signed)
Requested Prescriptions  Pending Prescriptions Disp Refills  . rosuvastatin (CRESTOR) 10 MG tablet [Pharmacy Med Name: ROSUVASTATIN 10MG  TABLETS] 90 tablet 2    Sig: TAKE 1 TABLET(10 MG) BY MOUTH DAILY     Cardiovascular:  Antilipid - Statins Passed - 03/31/2020  8:17 AM      Passed - Total Cholesterol in normal range and within 360 days    Cholesterol  Date Value Ref Range Status  10/10/2019 103 <200 mg/dL Final         Passed - LDL in normal range and within 360 days    LDL Cholesterol (Calc)  Date Value Ref Range Status  10/10/2019 38 mg/dL (calc) Final    Comment:    Reference range: <100 . Desirable range <100 mg/dL for primary prevention;   <70 mg/dL for patients with CHD or diabetic patients  with > or = 2 CHD risk factors. Marland Kitchen LDL-C is now calculated using the Martin-Hopkins  calculation, which is a validated novel method providing  better accuracy than the Friedewald equation in the  estimation of LDL-C.  Cresenciano Genre et al. Annamaria Helling. 1660;630(16): 2061-2068  (http://education.[employer].com/faq/FAQ164)          Passed - HDL in normal range and within 360 days    HDL  Date Value Ref Range Status  10/10/2019 51 > OR = 50 mg/dL Final         Passed - Triglycerides in normal range and within 360 days    Triglycerides  Date Value Ref Range Status  10/10/2019 55 <150 mg/dL Final         Passed - Patient is not pregnant      Passed - Valid encounter within last 12 months    Recent Outpatient Visits          2 months ago Seborrheic dermatitis of scalp   [place], DO   6 months ago Essential hypertension   [place], DO   9 months ago H. pylori infection   [place], DO   1 year ago Annual physical exam   [facility] Olin Hauser, DO   1 year ago Essential hypertension   [place], Devonne Doughty, DO      Future Appointments            In 3 weeks Laurence Ferrari, [place], [place]

## 2020-04-03 DIAGNOSIS — J301 Allergic rhinitis due to pollen: Secondary | ICD-10-CM | POA: Diagnosis not present

## 2020-04-04 DIAGNOSIS — L720 Epidermal cyst: Secondary | ICD-10-CM | POA: Diagnosis not present

## 2020-04-04 DIAGNOSIS — L723 Sebaceous cyst: Secondary | ICD-10-CM | POA: Diagnosis not present

## 2020-04-07 ENCOUNTER — Inpatient Hospital Stay (HOSPITAL_COMMUNITY): Payer: Federal, State, Local not specified - PPO

## 2020-04-07 ENCOUNTER — Emergency Department (HOSPITAL_COMMUNITY): Payer: Federal, State, Local not specified - PPO | Admitting: Certified Registered Nurse Anesthetist

## 2020-04-07 ENCOUNTER — Encounter (HOSPITAL_COMMUNITY)
Admission: EM | Disposition: A | Discharge status: Home / Self Care | Payer: Self-pay | Source: Home / Self Care | Attending: Student

## 2020-04-07 ENCOUNTER — Inpatient Hospital Stay (HOSPITAL_COMMUNITY)
Admission: EM | Admit: 2020-04-07 | Discharge: 2020-04-13 | DRG: 492 | Disposition: A | Discharge status: Home / Self Care | Payer: Federal, State, Local not specified - PPO | Attending: Orthopedic Surgery | Admitting: Orthopedic Surgery

## 2020-04-07 ENCOUNTER — Emergency Department (HOSPITAL_COMMUNITY): Payer: Federal, State, Local not specified - PPO

## 2020-04-07 ENCOUNTER — Encounter (HOSPITAL_COMMUNITY): Payer: Self-pay

## 2020-04-07 DIAGNOSIS — T1490XA Injury, unspecified, initial encounter: Secondary | ICD-10-CM

## 2020-04-07 DIAGNOSIS — I10 Essential (primary) hypertension: Secondary | ICD-10-CM | POA: Diagnosis present

## 2020-04-07 DIAGNOSIS — T148XXA Other injury of unspecified body region, initial encounter: Secondary | ICD-10-CM

## 2020-04-07 DIAGNOSIS — S82452A Displaced comminuted fracture of shaft of left fibula, initial encounter for closed fracture: Secondary | ICD-10-CM | POA: Diagnosis present

## 2020-04-07 DIAGNOSIS — J45909 Unspecified asthma, uncomplicated: Secondary | ICD-10-CM | POA: Diagnosis present

## 2020-04-07 DIAGNOSIS — Z419 Encounter for procedure for purposes other than remedying health state, unspecified: Secondary | ICD-10-CM

## 2020-04-07 DIAGNOSIS — S82252B Displaced comminuted fracture of shaft of left tibia, initial encounter for open fracture type I or II: Principal | ICD-10-CM

## 2020-04-07 DIAGNOSIS — S82872B Displaced pilon fracture of left tibia, initial encounter for open fracture type I or II: Secondary | ICD-10-CM | POA: Diagnosis present

## 2020-04-07 DIAGNOSIS — K219 Gastro-esophageal reflux disease without esophagitis: Secondary | ICD-10-CM | POA: Diagnosis present

## 2020-04-07 DIAGNOSIS — W19XXXA Unspecified fall, initial encounter: Secondary | ICD-10-CM

## 2020-04-07 DIAGNOSIS — S82202C Unspecified fracture of shaft of left tibia, initial encounter for open fracture type IIIA, IIIB, or IIIC: Secondary | ICD-10-CM

## 2020-04-07 DIAGNOSIS — W1789XA Other fall from one level to another, initial encounter: Secondary | ICD-10-CM

## 2020-04-07 DIAGNOSIS — S82832C Other fracture of upper and lower end of left fibula, initial encounter for open fracture type IIIA, IIIB, or IIIC: Secondary | ICD-10-CM | POA: Diagnosis not present

## 2020-04-07 DIAGNOSIS — Z90710 Acquired absence of both cervix and uterus: Secondary | ICD-10-CM

## 2020-04-07 DIAGNOSIS — R58 Hemorrhage, not elsewhere classified: Secondary | ICD-10-CM | POA: Diagnosis not present

## 2020-04-07 DIAGNOSIS — E1165 Type 2 diabetes mellitus with hyperglycemia: Secondary | ICD-10-CM | POA: Diagnosis not present

## 2020-04-07 DIAGNOSIS — R600 Localized edema: Secondary | ICD-10-CM | POA: Diagnosis not present

## 2020-04-07 DIAGNOSIS — R52 Pain, unspecified: Secondary | ICD-10-CM | POA: Diagnosis not present

## 2020-04-07 DIAGNOSIS — S82872C Displaced pilon fracture of left tibia, initial encounter for open fracture type IIIA, IIIB, or IIIC: Secondary | ICD-10-CM | POA: Diagnosis not present

## 2020-04-07 DIAGNOSIS — Z23 Encounter for immunization: Secondary | ICD-10-CM

## 2020-04-07 DIAGNOSIS — D62 Acute posthemorrhagic anemia: Secondary | ICD-10-CM | POA: Diagnosis not present

## 2020-04-07 DIAGNOSIS — R0902 Hypoxemia: Secondary | ICD-10-CM | POA: Diagnosis not present

## 2020-04-07 DIAGNOSIS — S82252A Displaced comminuted fracture of shaft of left tibia, initial encounter for closed fracture: Secondary | ICD-10-CM | POA: Diagnosis not present

## 2020-04-07 DIAGNOSIS — E7800 Pure hypercholesterolemia, unspecified: Secondary | ICD-10-CM | POA: Diagnosis not present

## 2020-04-07 DIAGNOSIS — S82392A Other fracture of lower end of left tibia, initial encounter for closed fracture: Secondary | ICD-10-CM | POA: Diagnosis not present

## 2020-04-07 DIAGNOSIS — Z20822 Contact with and (suspected) exposure to covid-19: Secondary | ICD-10-CM | POA: Diagnosis present

## 2020-04-07 DIAGNOSIS — S82832A Other fracture of upper and lower end of left fibula, initial encounter for closed fracture: Secondary | ICD-10-CM | POA: Diagnosis not present

## 2020-04-07 DIAGNOSIS — Z043 Encounter for examination and observation following other accident: Secondary | ICD-10-CM | POA: Diagnosis not present

## 2020-04-07 DIAGNOSIS — M79605 Pain in left leg: Secondary | ICD-10-CM | POA: Diagnosis present

## 2020-04-07 HISTORY — PX: EXTERNAL FIXATION LEG: SHX1549

## 2020-04-07 HISTORY — PX: I & D EXTREMITY: SHX5045

## 2020-04-07 HISTORY — DX: Unspecified asthma, uncomplicated: J45.909

## 2020-04-07 HISTORY — DX: Pure hypercholesterolemia, unspecified: E78.00

## 2020-04-07 LAB — BASIC METABOLIC PANEL
Anion gap: 11 (ref 5–15)
BUN: 19 mg/dL (ref 8–23)
CO2: 23 mmol/L (ref 22–32)
Calcium: 9.3 mg/dL (ref 8.9–10.3)
Chloride: 106 mmol/L (ref 98–111)
Creatinine, Ser: 1.12 mg/dL — ABNORMAL HIGH (ref 0.44–1.00)
GFR, Estimated: 55 mL/min — ABNORMAL LOW (ref >60)
Glucose, Bld: 241 mg/dL — ABNORMAL HIGH (ref 70–99)
Potassium: 3.9 mmol/L (ref 3.5–5.1)
Sodium: 140 mmol/L (ref 135–145)

## 2020-04-07 LAB — CBC WITH DIFFERENTIAL/PLATELET
Abs Immature Granulocytes: 0.05 10*3/uL (ref 0.00–0.07)
Basophils Absolute: 0.1 10*3/uL (ref 0.0–0.1)
Basophils Relative: 1 %
Eosinophils Absolute: 0 10*3/uL (ref 0.0–0.5)
Eosinophils Relative: 0 %
HCT: 37.8 % (ref 36.0–46.0)
Hemoglobin: 11.8 g/dL — ABNORMAL LOW (ref 12.0–15.0)
Immature Granulocytes: 0 %
Lymphocytes Relative: 11 %
Lymphs Abs: 1.6 10*3/uL (ref 0.7–4.0)
MCH: 30.2 pg (ref 26.0–34.0)
MCHC: 31.2 g/dL (ref 30.0–36.0)
MCV: 96.7 fL (ref 80.0–100.0)
Monocytes Absolute: 0.9 10*3/uL (ref 0.1–1.0)
Monocytes Relative: 6 %
Neutro Abs: 11.8 10*3/uL — ABNORMAL HIGH (ref 1.7–7.7)
Neutrophils Relative %: 82 %
Platelets: 356 10*3/uL (ref 150–400)
RBC: 3.91 MIL/uL (ref 3.87–5.11)
RDW: 12.1 % (ref 11.5–15.5)
WBC: 14.4 10*3/uL — ABNORMAL HIGH (ref 4.0–10.5)
nRBC: 0 % (ref 0.0–0.2)

## 2020-04-07 LAB — PROTIME-INR
INR: 1 (ref 0.8–1.2)
Prothrombin Time: 12.7 seconds (ref 11.4–15.2)

## 2020-04-07 LAB — RESPIRATORY PANEL BY RT PCR (FLU A&B, COVID)
Influenza A by PCR: NEGATIVE
Influenza B by PCR: NEGATIVE
SARS Coronavirus 2 by RT PCR: NEGATIVE

## 2020-04-07 SURGERY — EXTERNAL FIXATION, LOWER EXTREMITY
Anesthesia: General | Site: Leg Lower | Laterality: Left

## 2020-04-07 MED ORDER — PROPOFOL 10 MG/ML IV BOLUS
INTRAVENOUS | Status: AC
  Filled 2020-04-07: qty 20

## 2020-04-07 MED ORDER — LIDOCAINE 2% (20 MG/ML) 5 ML SYRINGE
INTRAMUSCULAR | Status: DC | PRN
  Administered 2020-04-07: 100 mg via INTRAVENOUS

## 2020-04-07 MED ORDER — ONDANSETRON HCL 4 MG/2ML IJ SOLN: 4.0000 mg | Freq: Four times a day (QID) | INTRAMUSCULAR | Status: DC | PRN

## 2020-04-07 MED ORDER — TOBRAMYCIN SULFATE 1.2 G IJ SOLR
INTRAMUSCULAR | Status: DC | PRN
  Administered 2020-04-07: 1.2 g

## 2020-04-07 MED ORDER — SODIUM CHLORIDE 0.9 % IR SOLN
Status: DC | PRN
  Administered 2020-04-07 (×2): 3000 mL

## 2020-04-07 MED ORDER — FENTANYL CITRATE (PF) 100 MCG/2ML IJ SOLN
INTRAMUSCULAR | Status: AC
  Filled 2020-04-07: qty 2

## 2020-04-07 MED ORDER — HYDROCODONE-ACETAMINOPHEN 5-325 MG PO TABS
1.0000 | ORAL_TABLET | ORAL | Status: DC | PRN
  Administered 2020-04-10 – 2020-04-11 (×2): 2 via ORAL
  Filled 2020-04-07: qty 2

## 2020-04-07 MED ORDER — PHENYLEPHRINE 40 MCG/ML (10ML) SYRINGE FOR IV PUSH (FOR BLOOD PRESSURE SUPPORT)
PREFILLED_SYRINGE | INTRAVENOUS | Status: AC
  Filled 2020-04-07: qty 10

## 2020-04-07 MED ORDER — ONDANSETRON HCL 4 MG PO TABS: 4.0000 mg | ORAL_TABLET | Freq: Four times a day (QID) | ORAL | Status: DC | PRN

## 2020-04-07 MED ORDER — ENOXAPARIN SODIUM 40 MG/0.4ML ~~LOC~~ SOLN
40.0000 mg | SUBCUTANEOUS | Status: DC
  Administered 2020-04-08 – 2020-04-13 (×5): 40 mg via SUBCUTANEOUS
  Filled 2020-04-07 (×5): qty 0.4

## 2020-04-07 MED ORDER — FENTANYL CITRATE (PF) 100 MCG/2ML IJ SOLN
25.0000 ug | INTRAMUSCULAR | Status: DC | PRN
  Administered 2020-04-07 (×3): 50 ug via INTRAVENOUS

## 2020-04-07 MED ORDER — ONDANSETRON HCL 4 MG/2ML IJ SOLN
INTRAMUSCULAR | Status: AC
  Filled 2020-04-07: qty 2

## 2020-04-07 MED ORDER — METHOCARBAMOL 500 MG PO TABS
500.0000 mg | ORAL_TABLET | Freq: Four times a day (QID) | ORAL | Status: DC | PRN
  Administered 2020-04-08 – 2020-04-13 (×9): 500 mg via ORAL
  Filled 2020-04-07 (×10): qty 1

## 2020-04-07 MED ORDER — CEFAZOLIN SODIUM-DEXTROSE 2-4 GM/100ML-% IV SOLN
INTRAVENOUS | Status: AC
  Filled 2020-04-07: qty 100

## 2020-04-07 MED ORDER — ROCURONIUM BROMIDE 10 MG/ML (PF) SYRINGE
PREFILLED_SYRINGE | INTRAVENOUS | Status: AC
  Filled 2020-04-07: qty 10

## 2020-04-07 MED ORDER — MIDAZOLAM HCL 2 MG/2ML IJ SOLN
INTRAMUSCULAR | Status: DC | PRN
  Administered 2020-04-07: 2 mg via INTRAVENOUS

## 2020-04-07 MED ORDER — FENTANYL CITRATE (PF) 250 MCG/5ML IJ SOLN
INTRAMUSCULAR | Status: AC
  Filled 2020-04-07: qty 5

## 2020-04-07 MED ORDER — METOCLOPRAMIDE HCL 5 MG/ML IJ SOLN: 5.0000 mg | Freq: Three times a day (TID) | INTRAMUSCULAR | Status: DC | PRN

## 2020-04-07 MED ORDER — 0.9 % SODIUM CHLORIDE (POUR BTL) OPTIME
TOPICAL | Status: DC | PRN
  Administered 2020-04-07: 1000 mL

## 2020-04-07 MED ORDER — POVIDONE-IODINE 10 % EX SWAB: 2.0000 "application " | Freq: Once | CUTANEOUS | Status: DC

## 2020-04-07 MED ORDER — PHENYLEPHRINE HCL-NACL 10-0.9 MG/250ML-% IV SOLN
INTRAVENOUS | Status: DC | PRN
  Administered 2020-04-07: 50 ug/min via INTRAVENOUS

## 2020-04-07 MED ORDER — PANTOPRAZOLE SODIUM 40 MG PO TBEC
40.0000 mg | DELAYED_RELEASE_TABLET | Freq: Every day | ORAL | Status: DC
  Administered 2020-04-08 – 2020-04-13 (×5): 40 mg via ORAL
  Filled 2020-04-07 (×5): qty 1

## 2020-04-07 MED ORDER — MORPHINE SULFATE (PF) 2 MG/ML IV SOLN
0.5000 mg | INTRAVENOUS | Status: DC | PRN
  Administered 2020-04-08 – 2020-04-11 (×2): 1 mg via INTRAVENOUS
  Administered 2020-04-11: 0.5 mg via INTRAVENOUS
  Administered 2020-04-12: 1 mg via INTRAVENOUS
  Filled 2020-04-07 (×4): qty 1

## 2020-04-07 MED ORDER — POTASSIUM CHLORIDE IN NACL 20-0.9 MEQ/L-% IV SOLN
INTRAVENOUS | Status: DC
  Filled 2020-04-07 (×5): qty 1000

## 2020-04-07 MED ORDER — VANCOMYCIN HCL 1000 MG IV SOLR
INTRAVENOUS | Status: DC | PRN
  Administered 2020-04-07: 1000 mg

## 2020-04-07 MED ORDER — HYDRALAZINE HCL 10 MG PO TABS: 10.0000 mg | ORAL_TABLET | Freq: Four times a day (QID) | ORAL | Status: DC | PRN

## 2020-04-07 MED ORDER — PROPOFOL 10 MG/ML IV BOLUS
INTRAVENOUS | Status: DC | PRN
  Administered 2020-04-07: 160 mg via INTRAVENOUS
  Administered 2020-04-07: 40 mg via INTRAVENOUS

## 2020-04-07 MED ORDER — LACTATED RINGERS IV SOLN: INTRAVENOUS | Status: DC | PRN

## 2020-04-07 MED ORDER — TOBRAMYCIN SULFATE 1.2 G IJ SOLR
INTRAMUSCULAR | Status: AC
  Filled 2020-04-07: qty 1.2

## 2020-04-07 MED ORDER — METOCLOPRAMIDE HCL 5 MG PO TABS: 5.0000 mg | ORAL_TABLET | Freq: Three times a day (TID) | ORAL | Status: DC | PRN

## 2020-04-07 MED ORDER — EPHEDRINE 5 MG/ML INJ
INTRAVENOUS | Status: AC
  Filled 2020-04-07: qty 10

## 2020-04-07 MED ORDER — HYDROCODONE-ACETAMINOPHEN 7.5-325 MG PO TABS
1.0000 | ORAL_TABLET | ORAL | Status: DC | PRN
  Administered 2020-04-07: 2 via ORAL
  Administered 2020-04-08: 1 via ORAL
  Administered 2020-04-08 – 2020-04-09 (×5): 2 via ORAL
  Filled 2020-04-07: qty 1
  Filled 2020-04-07 (×8): qty 2

## 2020-04-07 MED ORDER — FENTANYL CITRATE (PF) 100 MCG/2ML IJ SOLN
50.0000 ug | Freq: Once | INTRAMUSCULAR | Status: AC
  Administered 2020-04-07: 50 ug via INTRAVENOUS

## 2020-04-07 MED ORDER — POLYETHYLENE GLYCOL 3350 17 G PO PACK
17.0000 g | PACK | Freq: Every day | ORAL | Status: DC | PRN
  Administered 2020-04-11 – 2020-04-12 (×2): 17 g via ORAL
  Filled 2020-04-07 (×3): qty 1

## 2020-04-07 MED ORDER — MIDAZOLAM HCL 2 MG/2ML IJ SOLN
INTRAMUSCULAR | Status: AC
  Filled 2020-04-07: qty 2

## 2020-04-07 MED ORDER — CEFAZOLIN SODIUM-DEXTROSE 2-3 GM-%(50ML) IV SOLR
INTRAVENOUS | Status: DC | PRN
  Administered 2020-04-07: 2 g via INTRAVENOUS

## 2020-04-07 MED ORDER — METHOCARBAMOL 1000 MG/10ML IJ SOLN: 500.0000 mg | Freq: Four times a day (QID) | INTRAVENOUS | Status: DC | PRN

## 2020-04-07 MED ORDER — LIDOCAINE 2% (20 MG/ML) 5 ML SYRINGE
INTRAMUSCULAR | Status: AC
  Filled 2020-04-07: qty 5

## 2020-04-07 MED ORDER — DEXAMETHASONE SODIUM PHOSPHATE 10 MG/ML IJ SOLN
INTRAMUSCULAR | Status: DC | PRN
  Administered 2020-04-07: 10 mg via INTRAVENOUS

## 2020-04-07 MED ORDER — POTASSIUM CHLORIDE IN NACL 20-0.9 MEQ/L-% IV SOLN: INTRAVENOUS | Status: DC

## 2020-04-07 MED ORDER — CEFAZOLIN SODIUM-DEXTROSE 2-4 GM/100ML-% IV SOLN
2.0000 g | INTRAVENOUS | Status: AC
  Administered 2020-04-07: 2 g via INTRAVENOUS

## 2020-04-07 MED ORDER — DOCUSATE SODIUM 100 MG PO CAPS
100.0000 mg | ORAL_CAPSULE | Freq: Two times a day (BID) | ORAL | Status: DC
  Administered 2020-04-07 – 2020-04-13 (×11): 100 mg via ORAL
  Filled 2020-04-07 (×11): qty 1

## 2020-04-07 MED ORDER — TETANUS-DIPHTH-ACELL PERTUSSIS 5-2.5-18.5 LF-MCG/0.5 IM SUSY
0.5000 mL | PREFILLED_SYRINGE | Freq: Once | INTRAMUSCULAR | Status: AC
  Administered 2020-04-07: 0.5 mL via INTRAMUSCULAR

## 2020-04-07 MED ORDER — MORPHINE SULFATE (PF) 2 MG/ML IV SOLN
2.0000 mg | Freq: Once | INTRAVENOUS | Status: AC
  Administered 2020-04-07: 2 mg via INTRAVENOUS
  Filled 2020-04-07: qty 1

## 2020-04-07 MED ORDER — FENTANYL CITRATE (PF) 250 MCG/5ML IJ SOLN
INTRAMUSCULAR | Status: DC | PRN
  Administered 2020-04-07 (×3): 50 ug via INTRAVENOUS

## 2020-04-07 MED ORDER — SUCCINYLCHOLINE CHLORIDE 200 MG/10ML IV SOSY
PREFILLED_SYRINGE | INTRAVENOUS | Status: DC | PRN
  Administered 2020-04-07: 120 mg via INTRAVENOUS

## 2020-04-07 MED ORDER — SUCCINYLCHOLINE CHLORIDE 200 MG/10ML IV SOSY
PREFILLED_SYRINGE | INTRAVENOUS | Status: AC
  Filled 2020-04-07: qty 10

## 2020-04-07 MED ORDER — DEXAMETHASONE SODIUM PHOSPHATE 10 MG/ML IJ SOLN
INTRAMUSCULAR | Status: AC
  Filled 2020-04-07: qty 1

## 2020-04-07 MED ORDER — ACETAMINOPHEN 325 MG PO TABS: 325.0000 mg | ORAL_TABLET | Freq: Four times a day (QID) | ORAL | Status: DC | PRN

## 2020-04-07 MED ORDER — ONDANSETRON HCL 4 MG/2ML IJ SOLN
INTRAMUSCULAR | Status: DC | PRN
  Administered 2020-04-07: 4 mg via INTRAVENOUS

## 2020-04-07 MED ORDER — CEFAZOLIN SODIUM-DEXTROSE 2-4 GM/100ML-% IV SOLN: 2.0000 g | INTRAVENOUS | Status: DC

## 2020-04-07 MED ORDER — CHLORHEXIDINE GLUCONATE 4 % EX LIQD: 60.0000 mL | Freq: Once | CUTANEOUS | Status: DC

## 2020-04-07 MED ORDER — VANCOMYCIN HCL 1000 MG IV SOLR
INTRAVENOUS | Status: AC
  Filled 2020-04-07: qty 1000

## 2020-04-07 MED ORDER — PHENYLEPHRINE HCL (PRESSORS) 10 MG/ML IV SOLN
INTRAVENOUS | Status: DC | PRN
  Administered 2020-04-07 (×2): 80 ug via INTRAVENOUS

## 2020-04-07 SURGICAL SUPPLY — 73 items
BAR GLASS FIBER EXFX 11X150 (EXFIX) ×2 IMPLANT
BAR GLASS FIBER EXFX 11X200 (EXFIX) ×2 IMPLANT
BAR GLASS FIBER EXFX 11X400 (EXFIX) ×4 IMPLANT
BIT DRILL 3.2 XTRAFIX BLUE (BIT) ×2 IMPLANT
BIT DRILL CANN MED FLUTE 4.0 (BIT) ×1 IMPLANT
BNDG COHESIVE 4X5 TAN STRL (GAUZE/BANDAGES/DRESSINGS) ×2 IMPLANT
BNDG ELASTIC 4X5.8 VLCR STR LF (GAUZE/BANDAGES/DRESSINGS) ×2 IMPLANT
BNDG ELASTIC 6X5.8 VLCR STR LF (GAUZE/BANDAGES/DRESSINGS) IMPLANT
BNDG GAUZE ELAST 4 BULKY (GAUZE/BANDAGES/DRESSINGS) ×2 IMPLANT
BRUSH SCRUB EZ PLAIN DRY (MISCELLANEOUS) ×2 IMPLANT
CANISTER WOUNDNEG PRESSURE 500 (CANNISTER) ×2 IMPLANT
CHLORAPREP W/TINT 26 (MISCELLANEOUS) IMPLANT
CLAMP BLUE BAR TO BAR (EXFIX) ×4 IMPLANT
CLAMP BLUE BAR TO PIN (EXFIX) ×8 IMPLANT
COVER MAYO STAND STRL (DRAPES) ×2 IMPLANT
COVER SURGICAL LIGHT HANDLE (MISCELLANEOUS) ×2 IMPLANT
COVER WAND RF STERILE (DRAPES) ×2 IMPLANT
DRAPE C-ARM 42X72 X-RAY (DRAPES) ×2 IMPLANT
DRAPE C-ARMOR (DRAPES) ×2 IMPLANT
DRAPE IMP U-DRAPE 54X76 (DRAPES) IMPLANT
DRAPE ORTHO SPLIT 77X108 STRL (DRAPES) ×3
DRAPE SURG 17X23 STRL (DRAPES) IMPLANT
DRAPE SURG ORHT 6 SPLT 77X108 (DRAPES) ×3 IMPLANT
DRAPE U-SHAPE 47X51 STRL (DRAPES) ×2 IMPLANT
DRILL CANN 4.0MM (BIT) ×2
DRSG ADAPTIC 3X8 NADH LF (GAUZE/BANDAGES/DRESSINGS) IMPLANT
DRSG MEPITEL 4X7.2 (GAUZE/BANDAGES/DRESSINGS) IMPLANT
ELECT REM PT RETURN 9FT ADLT (ELECTROSURGICAL) ×2
ELECTRODE REM PT RTRN 9FT ADLT (ELECTROSURGICAL) ×1 IMPLANT
EVACUATOR 1/8 PVC DRAIN (DRAIN) IMPLANT
GAUZE SPONGE 4X4 12PLY STRL (GAUZE/BANDAGES/DRESSINGS) IMPLANT
GLOVE BIO SURGEON STRL SZ 6.5 (GLOVE) ×10 IMPLANT
GLOVE BIO SURGEON STRL SZ7.5 (GLOVE) ×8 IMPLANT
GLOVE BIOGEL PI IND STRL 6.5 (GLOVE) ×1 IMPLANT
GLOVE BIOGEL PI IND STRL 7.5 (GLOVE) ×1 IMPLANT
GLOVE BIOGEL PI IND STRL 8 (GLOVE) ×1 IMPLANT
GLOVE BIOGEL PI INDICATOR 6.5 (GLOVE) ×1
GLOVE BIOGEL PI INDICATOR 7.5 (GLOVE) ×1
GLOVE BIOGEL PI INDICATOR 8 (GLOVE) ×1
GOWN STRL REUS W/ TWL LRG LVL3 (GOWN DISPOSABLE) ×2 IMPLANT
GOWN STRL REUS W/TWL LRG LVL3 (GOWN DISPOSABLE) ×2
HANDPIECE INTERPULSE COAX TIP (DISPOSABLE) ×1
IV NS IRRIG 3000ML ARTHROMATIC (IV SOLUTION) ×4 IMPLANT
KIT BASIN OR (CUSTOM PROCEDURE TRAY) ×2 IMPLANT
KIT PREVENA INCISION MGT 13 (CANNISTER) ×2 IMPLANT
KIT TURNOVER KIT B (KITS) ×2 IMPLANT
MANIFOLD NEPTUNE II (INSTRUMENTS) ×2 IMPLANT
NEEDLE 22X1 1/2 (OR ONLY) (NEEDLE) IMPLANT
NS IRRIG 1000ML POUR BTL (IV SOLUTION) ×2 IMPLANT
PACK ORTHO EXTREMITY (CUSTOM PROCEDURE TRAY) ×2 IMPLANT
PAD ARMBOARD 7.5X6 YLW CONV (MISCELLANEOUS) ×4 IMPLANT
PADDING CAST COTTON 6X4 STRL (CAST SUPPLIES) IMPLANT
PIN 4X100X20MM EXFIX LG BLUNT (EXFIX) ×4 IMPLANT
PIN CAPS ORTHO GREEN .062 (PIN) ×2 IMPLANT
PIN CLAMP 2BAR 75MM BLUE (EXFIX) ×2 IMPLANT
PIN HALF 5X160X35 BLUNT TIP (EXFIX) ×4 IMPLANT
PIN TRANSFIXING 5.0 (EXFIX) ×2 IMPLANT
SET HNDPC FAN SPRY TIP SCT (DISPOSABLE) ×1 IMPLANT
SPONGE LAP 18X18 RF (DISPOSABLE) ×2 IMPLANT
STAPLER VISISTAT 35W (STAPLE) ×2 IMPLANT
STRIP CLOSURE SKIN 1/2X4 (GAUZE/BANDAGES/DRESSINGS) IMPLANT
SUT ETHILON 2 0 FS 18 (SUTURE) IMPLANT
SUT ETHILON 3 0 PS 1 (SUTURE) ×4 IMPLANT
SUT MON AB 2-0 CT1 36 (SUTURE) ×2 IMPLANT
SUT PDS AB 0 CT 36 (SUTURE) IMPLANT
SUT PROLENE 0 CT (SUTURE) IMPLANT
SWAB CULTURE ESWAB REG 1ML (MISCELLANEOUS) IMPLANT
TOWEL GREEN STERILE (TOWEL DISPOSABLE) ×4 IMPLANT
TOWEL GREEN STERILE FF (TOWEL DISPOSABLE) ×4 IMPLANT
TUBE CONNECTING 12X1/4 (SUCTIONS) ×2 IMPLANT
UNDERPAD 30X36 HEAVY ABSORB (UNDERPADS AND DIAPERS) ×2 IMPLANT
WATER STERILE IRR 1000ML POUR (IV SOLUTION) ×2 IMPLANT
YANKAUER SUCT BULB TIP NO VENT (SUCTIONS) ×2 IMPLANT

## 2020-04-07 NOTE — Progress Notes (Signed)
Orthopedic Tech Progress Note Patient Details:  [name] Apr 12, 1956 [id number]  Patient ID: Kendra Allison, female   DOB: 1956-03-29, 64 y.o.   MRN: [id number] [facility]  Karolee Stamps 04/07/2020, 11:15 PM

## 2020-04-07 NOTE — Anesthesia Postprocedure Evaluation (Signed)
Anesthesia Post Note  Patient: Kendra Allison  Procedure(s) Performed: EXTERNAL FIXATION LEG (Left Leg Lower) IRRIGATION AND DEBRIDEMENT EXTREMITY (Left Leg Lower)     Patient location during evaluation: PACU Anesthesia Type: General Level of consciousness: awake Pain management: pain level controlled Vital Signs Assessment: post-procedure vital signs reviewed and stable Respiratory status: spontaneous breathing Cardiovascular status: stable Postop Assessment: no apparent nausea or vomiting Anesthetic complications: no   No complications documented.  Last Vitals:  Vitals:   04/07/20 2130 04/07/20 2135  BP: (!) 132/93   Pulse: 93 89  Resp: (!) 21 16  Temp: 37 C   SpO2: 99% 100%    Last Pain:  Vitals:   04/07/20 2130  TempSrc:   PainSc: 3                  Keola Heninger

## 2020-04-07 NOTE — ED Notes (Signed)
Placed on  bedpan

## 2020-04-07 NOTE — Anesthesia Procedure Notes (Signed)
Procedure Name: Intubation Date/Time: 04/07/2020 7:05 PM Performed by: Valetta Fuller, CRNA Pre-anesthesia Checklist: Patient identified, Emergency Drugs available, Suction available and Patient being monitored Patient Re-evaluated:Patient Re-evaluated prior to induction Oxygen Delivery Method: Circle system utilized Preoxygenation: Pre-oxygenation with 100% oxygen Induction Type: IV induction and Rapid sequence Laryngoscope Size: Miller and 2 Grade View: Grade I Tube type: Oral Tube size: 7.0 mm Number of attempts: 1 Airway Equipment and Method: Stylet Placement Confirmation: ETT inserted through vocal cords under direct vision,  positive ETCO2 and breath sounds checked- equal and bilateral Secured at: 22 cm Tube secured with: Tape Dental Injury: Teeth and Oropharynx as per pre-operative assessment

## 2020-04-07 NOTE — Consult Note (Signed)
Orthopaedic Trauma Service (OTS) Consult   Patient ID: Kendra Allison MRN: [id number] DOB/AGE: [birth year] 64 y.o.  Reason for Consult: Left open ankle injury Referring Physician: Dr. Roosevelt Locks, MD [facility] ED)  HPI: Kendra Allison is an 64 y.o. female being seen in consultation request of Dr. Ottis Stain for left ankle injury.  Patient was in her attic earlier today when she fell through the ceiling and landed on the floor below.  Had immediate pain and inability to weight-bear on the left lower extremity.  Open wound to the lower leg noted.  Was brought to [facility] emergency department for evaluation and was found to have a left open distal tibia fracture with exposed bone.  Orthopedic trauma service was consulted for evaluation and management.  Patient seen this evening in emergency department.  Other than pain in the left leg denies any significant pain or injury to other extremities.  Denies any significant numbness or tingling.  Has history of HTN, asthma, GERD. On no blood thinners. Ambulates at baseline without assistive device. Has a sister than lives in [place], [place] and another sister who lives in [place], [place].  Past Medical History:  Diagnosis Date  . Asthma   . GERD (gastroesophageal reflux disease)   . Hypercholesteremia   . Hypertension     Past Surgical History:  Procedure Laterality Date  . ABDOMINAL HYSTERECTOMY    . Nissem  2014    No family history on file.  Social History:  reports that she has never smoked. She does not have any smokeless tobacco history on file. She reports current alcohol use. She reports that she does not use drugs.  Allergies: Not on File  Medications: I have reviewed the patient's current medications. Prior to Admission: (Not in a hospital admission)   ROS: Constitutional: No fever or chills Vision: No changes in vision ENT: No difficulty swallowing CV: No chest pain Pulm: No SOB or wheezing GI: No nausea or vomiting GU: No  urgency or inability to hold urine Skin: + Wound left medial ankle Neurologic: No numbness or tingling Psychiatric: No depression or anxiety Heme: No bruising Allergic: No reaction to medications or food   Exam: Blood pressure (!) 174/94, pulse (!) 104, temperature (!) 100.4 F (38 C), temperature source Oral, resp. rate 19, height 5\' 6"  (1.676 m), weight 81.2 kg, SpO2 97 %. General: Sitting up in bed, no acute distress Orientation: Alert and oriented x4 Mood and Affect: Mood and affect appropriate Gait: Not assessed due to known fracture Coordination and balance: Within normal limits  Left lower extremity: Wound noted to medial aspect of distal tibia with gauze and Kerlix dressing in place.  No tenderness or instability noted in the knee.  Swelling about the lower leg and ankle appropriate.  Endorses sensation to light touch over the dorsum plantar aspect of her foot.  Foot is slightly cool but equal to contralateral side.  Able to wiggle toes.  2+ DP pulse  Right lower extremity/bilateral upper extremities: Skin without lesions. No tenderness to palpation. Full painless ROM, full strength in each muscle groups without evidence of instability.   Medical Decision Making: Data: Imaging: AP and lateral views of left tibia/fibula and ankle shows angulated and displaced distal tibia fracture.  There is an associated mildly displaced comminuted fracture of the fibular head and neck.  Labs:  Results for orders placed or performed during the hospital encounter of 04/07/20 (from the past 24 hour(s))  CBC with Differential     Status:  Abnormal   Collection Time: 04/07/20  4:53 PM  Result Value Ref Range   WBC 14.4 (H) 4.0 - 10.5 K/uL   RBC 3.91 3.87 - 5.11 MIL/uL   Hemoglobin 11.8 (L) 12.0 - 15.0 g/dL   HCT 37.8 36 - 46 %   MCV 96.7 80.0 - 100.0 fL   MCH 30.2 26.0 - 34.0 pg   MCHC 31.2 30.0 - 36.0 g/dL   RDW 12.1 11.5 - 15.5 %   Platelets 356 150 - 400 K/uL   nRBC 0.0 0.0 - 0.2 %    Neutrophils Relative % 82 %   Neutro Abs 11.8 (H) 1.7 - 7.7 K/uL   Lymphocytes Relative 11 %   Lymphs Abs 1.6 0.7 - 4.0 K/uL   Monocytes Relative 6 %   Monocytes Absolute 0.9 0.1 - 1.0 K/uL   Eosinophils Relative 0 %   Eosinophils Absolute 0.0 0.0 - 0.5 K/uL   Basophils Relative 1 %   Basophils Absolute 0.1 0.0 - 0.1 K/uL   Immature Granulocytes 0 %   Abs Immature Granulocytes 0.05 0.00 - 0.07 K/uL    Medical history and chart was reviewed and case discussed with medical provider.  Assessment/Plan: 64 year old female status post fall through attic floor, resulting in open pilon fracture.  Patient with significant injury to left lower extremity which will require surgical intervention.  Due to the open nature of the injury, we will plan to proceed to the operating room this evening for irrigation debridement of the wound and placement of external fixator versus open reduction internal fixation.  Risks and benefits of the procedure discussed with the patient at bedside.  Questions were answered.  Consent will be obtained.  We will plan to admit to orthopedic trauma service postoperatively.  Juanita Streight A. Carmie Kanner [facility] [phone] (office) [email or web address]

## 2020-04-07 NOTE — ED Provider Notes (Signed)
[facility] EMERGENCY DEPARTMENT Provider Note   CSN: [id number] Arrival date & time: 04/07/20  1523     History No chief complaint on file.   Kendra Allison is a 64 y.o. female.  The history is provided by the patient.  Trauma Mechanism of injury: fall Injury location: leg Injury location detail: L lower leg Incident location: home Arrived directly from scene: yes   Fall:      Fall occurred: through ceiling.      Point of impact: feet      Entrapped after fall: no  EMS/PTA data:      Bystander interventions: splinting      Blood loss: minimal      Responsiveness: alert      Loss of consciousness: no      Amnesic to event: no      Airway interventions: none  Current symptoms:      Associated symptoms:            Denies abdominal pain, back pain, chest pain, headache, loss of consciousness, nausea, neck pain and vomiting.       No past medical history on file.  There are no problems to display for this patient.      OB History   No obstetric history on file.     No family history on file.  Social History   Tobacco Use  . Smoking status: Not on file  Substance Use Topics  . Alcohol use: Not on file  . Drug use: Not on file    Home Medications Prior to Admission medications   Not on File    Allergies    Patient has no allergy information on record.  Review of Systems   Review of Systems  Constitutional: Negative for chills and fever.  HENT: Negative for congestion, sore throat and trouble swallowing.   Eyes: Negative for visual disturbance.  Respiratory: Negative for cough and shortness of breath.   Cardiovascular: Negative for chest pain.  Gastrointestinal: Negative for abdominal pain, diarrhea, nausea and vomiting.  Genitourinary: Negative for difficulty urinating and dysuria.  Musculoskeletal: Negative for back pain and neck pain.       Left lower leg pain  Skin: Positive for wound.  Neurological: Negative for  loss of consciousness, weakness and headaches.  All other systems reviewed and are negative.   Physical Exam Updated Vital Signs Ht 5\' 6"  (1.676 m)   Wt 81.2 kg   BMI 28.89 kg/m   Physical Exam Vitals reviewed.  Constitutional:      General: She is not in acute distress.    Appearance: Normal appearance.  HENT:     Head: Normocephalic and atraumatic.     Nose: Nose normal.     Mouth/Throat:     Mouth: Mucous membranes are moist.     Pharynx: Oropharynx is clear.  Eyes:     Conjunctiva/sclera: Conjunctivae normal.  Cardiovascular:     Rate and Rhythm: Normal rate.     Pulses: Normal pulses.     Heart sounds: Normal heart sounds.  Pulmonary:     Effort: Pulmonary effort is normal.     Breath sounds: Normal breath sounds.  Abdominal:     General: Abdomen is flat.     Palpations: Abdomen is soft.     Tenderness: There is no abdominal tenderness.  Musculoskeletal:        General: Deformity and signs of injury present.     Cervical back: Neck supple. No tenderness.  Right lower leg: No edema.     Left lower leg: No edema.     Comments: LLE deformity with ~2cm open laceration and exposed bone medial mid shin with surrounding tenderness and swelling throughout  No midline cervical, thoracic, or lumbar tenderness; Mild sacral tenderness  Skin:    General: Skin is warm and dry.     Findings: Lesion present.  Neurological:     Mental Status: She is alert.  Psychiatric:        Mood and Affect: Mood normal.        Behavior: Behavior normal.     ED Results / Procedures / Treatments   Labs (all labs ordered are listed, but only abnormal results are displayed) Labs Reviewed  RESPIRATORY PANEL BY RT PCR (FLU A&B, COVID)  CBC WITH DIFFERENTIAL/PLATELET  BASIC METABOLIC PANEL  PROTIME-INR    EKG None  Radiology No results found.  Procedures Procedures (including critical care time)  Medications Ordered in ED Medications  ceFAZolin (ANCEF) IVPB 2g/100 mL  premix (2 g Intravenous New Bag/Given 04/07/20 1610)  Tdap (BOOSTRIX) injection 0.5 mL (0.5 mLs Intramuscular Given 04/07/20 1612)  fentaNYL (SUBLIMAZE) injection 50 mcg (50 mcg Intravenous Given 04/07/20 1608)    ED Course  I have reviewed the triage vital signs and the nursing notes.  Pertinent labs & imaging results that were available during my care of the patient were reviewed by me and considered in my medical decision making (see chart for details).    MDM Rules/Calculators/A&P                           Medical Decision Making: Kendra Allison is a 64 y.o. female who presented to the ED today with fall Pt reports she went in her attic to check on her heating system and stepped through the floor of the attic falling through the ceiling and landing feet first in the room below her. Pt had immediate pain in her L lower leg with obvious deformity. Pt able to hop on her other foot to call for help. Pt reports no other injuries, denies LOC.    Past medical history significant for HTN, asthma, GERD  Reviewed and confirmed nursing documentation for past medical history, family history, social history.  On my initial exam, the pt was in NAD, obvious deformity to LLE with open fracture, foot neurovascularly intact, head atraumatic, abdomen soft, mild sacral tenderness.  Given ancef and tdap.  Fentanyl IV for pain.  Xray shows  Comminuted and displaced open fracture of the distal tibia and fibular head/neck and distal fibular diaphysis. Possible mildly displaced cortical fracture of the medial posterior calcaneus. CXR and pelvic xray negative.   Consults: Orthopedic surgery   All radiology and laboratory studies reviewed independently and with my attending physician, agree with reading provided by radiologist unless otherwise noted.   Upon reassessing patient, patient was resting comfortably, NAD.  Based on the above findings, I believe patient requires admission.    The above care  was discussed with and agreed upon by my attending physician. Emergency Department Medication Summary:  Medications  ceFAZolin (ANCEF) IVPB 2g/100 mL premix (2 g Intravenous New Bag/Given 04/07/20 1610)  Tdap (BOOSTRIX) injection 0.5 mL (0.5 mLs Intramuscular Given 04/07/20 1612)  fentaNYL (SUBLIMAZE) injection 50 mcg (50 mcg Intravenous Given 04/07/20 1608)       Final Clinical Impression(s) / ED Diagnoses Final diagnoses:  Trauma    Rx / DC Orders ED  Discharge Orders    None       Roosevelt Locks, MD 04/07/20 [id number]    Lajean Saver, MD 04/07/20 [phone]

## 2020-04-07 NOTE — Op Note (Signed)
Orthopaedic Surgery Operative Note (CSN: [id number] ) Date of Surgery: 04/07/2020  Admit Date: 04/07/2020   Diagnoses: Pre-Op Diagnoses: Open tibia shaft/pilon fracture   Post-Op Diagnosis: Type IIIA open tibial shaft/pilon fracture  Procedures: 1. CPT 20692-External fixation of left tibia/ankle 2. CPT 11012-Irrigation and debridement of left open tibia fracture 3. CPT 27825-Closed reduction of left pilon fracture 4. CPT 97605-Incisional wound vac placement  Surgeons : Primary: Rylyn Ranganathan, Thomasene Lot, MD  Assistant: [name], PA-C  Location: OR 9   Anesthesia:General  Antibiotics: Ancef 2g preop with 1 gm vancomycin powder and 1.2 gm tobramycin powder placed topically.   Tourniquet time:None    Estimated Blood VQQV:95 mL  Complications:None   Specimens:None   Implants: [employer] Xtra-fix  Indications for Surgery: 64 year old female who fell through the ceiling in her house.  She sustained a left open distal tibia/pilon fracture.  Due to the unstable nature of her injury I recommend proceeding to the operating room for irrigation debridement with external fixation.  Risks and benefits were discussed with the patient.  Risks include but not limited to bleeding, infection, malunion, nonunion, hardware failure, hardware irritation, nerve or vessel injury, posttraumatic arthritis, ankle stiffness, DVT, even the possibility anesthetic complications.  Patient agreed to proceed with surgery and consent was obtained.Marland Kitchen  Operative Findings: 1.  Irrigation and debridement of left type IIIa open distal tibial shaft/pilon fracture 2.  Closed reduction and external fixation of left distal tibia/ankle. 3.  Incisional wound VAC placement to left lower extremity over traumatic laceration.  Procedure: The patient was identified in the preoperative holding area. Consent was confirmed with the patient and their family and all questions were answered. The operative extremity was marked  after confirmation with the patient. she was then brought back to the operating room by our anesthesia colleagues.  She was placed under general anesthetic and carefully transferred over to a radiolucent flat top table.  A bump was placed under her operative hip.  The left lower extremity was then prepped and draped in usual sterile fashion.  A timeout was performed verifying patient, procedure, and extremity.  Preoperative antibiotics were dosed.  There was a 2 cm laceration over the posterior medial aspect of the distal tibia.  I first started out by extending this distally and proximally to be able access the open fracture wound.  I did excisionally debride the skin edges with a 10 blade.  There was one small cortical fragment that was devoid of soft tissue which I removed.  The remainder of the bony fragments appeared to have soft tissue attachment still in place.  I then used low pressure pulsatile lavage to thoroughly irrigate the wound and the bone edges.  I delivered the bone ends through the wound and used approximately 6 L of normal saline.  There is no contamination in the wound.  Gloves and instruments were then changed.  I then obtained fluoroscopic imaging to show the unstable nature of her injury.  I first marked out a potential proximal extent of where a potential plate would and.  I tried to make my clinic clamp proximal to this.  I percutaneously drilled and placed a 5.0 mm threaded half pins.  I did this with 2 5.0 mm pins and confirmed positioning with fluoroscopy.  I then used a pin clamp to connect the pins.  I then percutaneously placed a 6.35mm threaded calcaneal transfixion pin.  I then used drilled and placed 4.0 mm threaded half pins in the first and fifth metatarsal.  I then connected the calcaneal and metatarsal pins with 11 mm bar.  I then connected the medial lateral bar to the tibial shaft clamp.  A reduction maneuver was then performed.  Fluoroscopic imaging confirmed adequate  reduction and the construct was tightened.  I then irrigated the wound once more.  A gram of vancomycin powder 1.2 g of tobramycin powder was placed into the fracture site.  I then closed the traumatic laceration with a 2-0 Monocryl and 3-0 nylon.  An incisional wound VAC was placed to the laceration.  The pin sites were dressed with Kerlix.  A sterile cast padding and an Ace wrap was placed to provide compression.  The patient was then awoken from anesthesia and taken to the PACU in stable condition.  Post Op Plan/Instructions: The patient be nonweightbearing to left lower extremity.  She will receive postoperative ceftriaxone for type III open fracture prophylaxis.  We will start her on Lovenox postoperative day 1.  We will obtain a CT scan for surgical planning.  We will assess her swallowing to determine if definitive fixation can be performed during this hospitalization.  We will mobilize her with physical and Occupational Therapy.  I was present and performed the entire surgery.  [name], PA-C did assist me throughout the case. An assistant was necessary given the difficulty in approach, maintenance of reduction and ability to instrument the fracture.   Katha Hamming, MD [facility]

## 2020-04-07 NOTE — ED Triage Notes (Addendum)
Per [facility]: Pt fell through her garage ceiling onto the concrete. She landed on her feet, fell onto her butt, and then back, she did hit her head. Denies LOC, neck, or upper back pain. Pt does have pain in her lower back. Pt arrives with a splint in place on her left ankle and a bandage in place. EMS states that they found the ankle deformed with an open fracture. There is a wound to the lateral left lower leg about 6 inches above ankle. The wound is 2 cm wide and 3 cm long. Bleeding is controlled at this time. PMS is intact distal to injury, skin is warm and dry, pulse is present and strong, pt able to move all toes. EMS started an 18 g IV in left AC prior to arrival. Pt was given a total of 100 mcg fentanyl with EMS and about 150 ml of saline en route. Pt alert and appropriate.

## 2020-04-07 NOTE — Anesthesia Preprocedure Evaluation (Addendum)
Anesthesia Evaluation  Patient identified by MRN, date of birth, ID band Patient awake    Reviewed: Allergy & Precautions, NPO status , Patient's Chart, lab work & pertinent test results  Airway Mallampati: II  TM Distance: >3 FB     Dental   Pulmonary asthma ,    breath sounds clear to auscultation       Cardiovascular hypertension,  Rhythm:Regular Rate:Normal     Neuro/Psych negative neurological ROS     GI/Hepatic Neg liver ROS, GERD  ,  Endo/Other  negative endocrine ROS  Renal/GU negative Renal ROS     Musculoskeletal   Abdominal   Peds  Hematology   Anesthesia Other Findings   Reproductive/Obstetrics                            Anesthesia Physical Anesthesia Plan  ASA: III  Anesthesia Plan: General   Post-op Pain Management:    Induction: Intravenous  PONV Risk Score and Plan: 3 and Ondansetron, Dexamethasone and Midazolam  Airway Management Planned: Oral ETT  Additional Equipment:   Intra-op Plan:   Post-operative Plan: Extubation in OR  Informed Consent: I have reviewed the patients History and Physical, chart, labs and discussed the procedure including the risks, benefits and alternatives for the proposed anesthesia with the patient or authorized representative who has indicated his/her understanding and acceptance.     Dental advisory given  Plan Discussed with: CRNA and Anesthesiologist  Anesthesia Plan Comments:         Anesthesia Quick Evaluation

## 2020-04-07 NOTE — H&P (View-Only) (Signed)
Orthopaedic Trauma Service (OTS) Consult   Patient ID: Kendra Allison MRN: [id number] DOB/AGE: 07/26/1955 64 y.o.  Reason for Consult: Left open ankle injury Referring Physician: Dr. Roosevelt Locks, MD [facility] ED)  HPI: Kendra Allison is an 64 y.o. female being seen in consultation request of Dr. Ottis Stain for left ankle injury.  Patient was in her attic earlier today when she fell through the ceiling and landed on the floor below.  Had immediate pain and inability to weight-bear on the left lower extremity.  Open wound to the lower leg noted.  Was brought to [facility] emergency department for evaluation and was found to have a left open distal tibia fracture with exposed bone.  Orthopedic trauma service was consulted for evaluation and management.  Patient seen this evening in emergency department.  Other than pain in the left leg denies any significant pain or injury to other extremities.  Denies any significant numbness or tingling.  Has history of HTN, asthma, GERD. On no blood thinners. Ambulates at baseline without assistive device. Has a sister than lives in [place], [place] and another sister who lives in [place], [place].  Past Medical History:  Diagnosis Date  . Asthma   . GERD (gastroesophageal reflux disease)   . Hypercholesteremia   . Hypertension     Past Surgical History:  Procedure Laterality Date  . ABDOMINAL HYSTERECTOMY    . Nissem  2014    No family history on file.  Social History:  reports that she has never smoked. She does not have any smokeless tobacco history on file. She reports current alcohol use. She reports that she does not use drugs.  Allergies: Not on File  Medications: I have reviewed the patient's current medications. Prior to Admission: (Not in a hospital admission)   ROS: Constitutional: No fever or chills Vision: No changes in vision ENT: No difficulty swallowing CV: No chest pain Pulm: No SOB or wheezing GI: No nausea or vomiting GU: No  urgency or inability to hold urine Skin: + Wound left medial ankle Neurologic: No numbness or tingling Psychiatric: No depression or anxiety Heme: No bruising Allergic: No reaction to medications or food   Exam: Blood pressure (!) 174/94, pulse (!) 104, temperature (!) 100.4 F (38 C), temperature source Oral, resp. rate 19, height 5\' 6"  (1.676 m), weight 81.2 kg, SpO2 97 %. General: Sitting up in bed, no acute distress Orientation: Alert and oriented x4 Mood and Affect: Mood and affect appropriate Gait: Not assessed due to known fracture Coordination and balance: Within normal limits  Left lower extremity: Wound noted to medial aspect of distal tibia with gauze and Kerlix dressing in place.  No tenderness or instability noted in the knee.  Swelling about the lower leg and ankle appropriate.  Endorses sensation to light touch over the dorsum plantar aspect of her foot.  Foot is slightly cool but equal to contralateral side.  Able to wiggle toes.  2+ DP pulse  Right lower extremity/bilateral upper extremities: Skin without lesions. No tenderness to palpation. Full painless ROM, full strength in each muscle groups without evidence of instability.   Medical Decision Making: Data: Imaging: AP and lateral views of left tibia/fibula and ankle shows angulated and displaced distal tibia fracture.  There is an associated mildly displaced comminuted fracture of the fibular head and neck.  Labs:  Results for orders placed or performed during the hospital encounter of 04/07/20 (from the past 24 hour(s))  CBC with Differential     Status:  Abnormal   Collection Time: 04/07/20  4:53 PM  Result Value Ref Range   WBC 14.4 (H) 4.0 - 10.5 K/uL   RBC 3.91 3.87 - 5.11 MIL/uL   Hemoglobin 11.8 (L) 12.0 - 15.0 g/dL   HCT 37.8 36 - 46 %   MCV 96.7 80.0 - 100.0 fL   MCH 30.2 26.0 - 34.0 pg   MCHC 31.2 30.0 - 36.0 g/dL   RDW 12.1 11.5 - 15.5 %   Platelets 356 150 - 400 K/uL   nRBC 0.0 0.0 - 0.2 %    Neutrophils Relative % 82 %   Neutro Abs 11.8 (H) 1.7 - 7.7 K/uL   Lymphocytes Relative 11 %   Lymphs Abs 1.6 0.7 - 4.0 K/uL   Monocytes Relative 6 %   Monocytes Absolute 0.9 0.1 - 1.0 K/uL   Eosinophils Relative 0 %   Eosinophils Absolute 0.0 0.0 - 0.5 K/uL   Basophils Relative 1 %   Basophils Absolute 0.1 0.0 - 0.1 K/uL   Immature Granulocytes 0 %   Abs Immature Granulocytes 0.05 0.00 - 0.07 K/uL    Medical history and chart was reviewed and case discussed with medical provider.  Assessment/Plan: 64 year old female status post fall through attic floor, resulting in open pilon fracture.  Patient with significant injury to left lower extremity which will require surgical intervention.  Due to the open nature of the injury, we will plan to proceed to the operating room this evening for irrigation debridement of the wound and placement of external fixator versus open reduction internal fixation.  Risks and benefits of the procedure discussed with the patient at bedside.  Questions were answered.  Consent will be obtained.  We will plan to admit to orthopedic trauma service postoperatively.  Kendra Allison A. Carmie Kanner [facility] [phone] (office) [email or web address]

## 2020-04-07 NOTE — Transfer of Care (Signed)
Immediate Anesthesia Transfer of Care Note  Patient: Haasini Patnaude  Procedure(s) Performed: EXTERNAL FIXATION LEG (Left Leg Lower) IRRIGATION AND DEBRIDEMENT EXTREMITY (Left Leg Lower)  Patient Location: PACU  Anesthesia Type:General  Level of Consciousness: sedated  Airway & Oxygen Therapy: Patient Spontanous Breathing  Post-op Assessment: Report given to RN and Post -op Vital signs reviewed and stable  Post vital signs: Reviewed and stable  Last Vitals:  Vitals Value Taken Time  BP 138/87 04/07/20 2039  Temp    Pulse 92 04/07/20 2039  Resp 19 04/07/20 2039  SpO2 92 % 04/07/20 2039  Vitals shown include unvalidated device data.  Last Pain:  Vitals:   04/07/20 1836  TempSrc: Oral  PainSc:          Complications: No complications documented.

## 2020-04-07 NOTE — Interval H&P Note (Signed)
History and Physical Interval Note:  04/07/2020 6:56 PM  Kendra Allison  has presented today for surgery, with the diagnosis of Left open pilon fracture.  The various methods of treatment have been discussed with the patient and family. After consideration of risks, benefits and other options for treatment, the patient has consented to  Procedure(s): EXTERNAL FIXATION LEG (Left) IRRIGATION AND DEBRIDEMENT EXTREMITY (Left) as a surgical intervention.  The patient's history has been reviewed, patient examined, no change in status, stable for surgery.  I have reviewed the patient's chart and labs.  Questions were answered to the patient's satisfaction.     Lennette Bihari P Smrithi Pigford

## 2020-04-07 NOTE — Progress Notes (Signed)
°   04/07/20 1520  Clinical Encounter Type  Visited With Patient not available  Visit Type Trauma  Referral From Nurse  Consult/Referral To Chaplain  Chaplain responded. The patient's family was not present. Advised Financial controller to call if chaplain services are needed. This note was prepared by Jeanine Luz, M.Div..  For questions please contact by phone [phone].

## 2020-04-08 ENCOUNTER — Encounter (HOSPITAL_COMMUNITY): Payer: Self-pay | Admitting: Student

## 2020-04-08 ENCOUNTER — Other Ambulatory Visit: Payer: Self-pay

## 2020-04-08 ENCOUNTER — Inpatient Hospital Stay (HOSPITAL_COMMUNITY): Payer: Federal, State, Local not specified - PPO

## 2020-04-08 DIAGNOSIS — S82832A Other fracture of upper and lower end of left fibula, initial encounter for closed fracture: Secondary | ICD-10-CM | POA: Diagnosis not present

## 2020-04-08 DIAGNOSIS — S82252A Displaced comminuted fracture of shaft of left tibia, initial encounter for closed fracture: Secondary | ICD-10-CM | POA: Diagnosis not present

## 2020-04-08 LAB — BASIC METABOLIC PANEL
Anion gap: 11 (ref 5–15)
BUN: 13 mg/dL (ref 8–23)
CO2: 24 mmol/L (ref 22–32)
Calcium: 9.4 mg/dL (ref 8.9–10.3)
Chloride: 104 mmol/L (ref 98–111)
Creatinine, Ser: 1.15 mg/dL — ABNORMAL HIGH (ref 0.44–1.00)
GFR, Estimated: 54 mL/min — ABNORMAL LOW (ref >60)
Glucose, Bld: 169 mg/dL — ABNORMAL HIGH (ref 70–99)
Potassium: 4.3 mmol/L (ref 3.5–5.1)
Sodium: 139 mmol/L (ref 135–145)

## 2020-04-08 LAB — VITAMIN D 25 HYDROXY (VIT D DEFICIENCY, FRACTURES): Vit D, 25-Hydroxy: 47.97 ng/mL (ref 30–100)

## 2020-04-08 LAB — CBC
HCT: 35.3 % — ABNORMAL LOW (ref 36.0–46.0)
Hemoglobin: 11.5 g/dL — ABNORMAL LOW (ref 12.0–15.0)
MCH: 30.3 pg (ref 26.0–34.0)
MCHC: 32.6 g/dL (ref 30.0–36.0)
MCV: 92.9 fL (ref 80.0–100.0)
Platelets: 340 10*3/uL (ref 150–400)
RBC: 3.8 MIL/uL — ABNORMAL LOW (ref 3.87–5.11)
RDW: 12.3 % (ref 11.5–15.5)
WBC: 8.2 10*3/uL (ref 4.0–10.5)
nRBC: 0 % (ref 0.0–0.2)

## 2020-04-08 MED ORDER — SODIUM CHLORIDE 0.9 % IV SOLN
2.0000 g | INTRAVENOUS | Status: AC
  Administered 2020-04-08 – 2020-04-09 (×2): 2 g via INTRAVENOUS
  Filled 2020-04-08 (×2): qty 20

## 2020-04-08 MED ORDER — CEFAZOLIN SODIUM-DEXTROSE 2-4 GM/100ML-% IV SOLN
2.0000 g | INTRAVENOUS | Status: AC
  Administered 2020-04-10: 2 g via INTRAVENOUS
  Filled 2020-04-08: qty 100

## 2020-04-08 MED ORDER — GABAPENTIN 100 MG PO CAPS
100.0000 mg | ORAL_CAPSULE | Freq: Three times a day (TID) | ORAL | Status: DC
  Administered 2020-04-08 – 2020-04-13 (×15): 100 mg via ORAL
  Filled 2020-04-08 (×15): qty 1

## 2020-04-08 MED ORDER — SODIUM CHLORIDE 0.9 % IV SOLN
2.0000 g | INTRAVENOUS | Status: DC
Start: 2020-04-08 — End: 2020-04-08

## 2020-04-08 MED ORDER — SODIUM CHLORIDE 0.9 % IV SOLN
2.0000 g | INTRAVENOUS | Status: DC
  Filled 2020-04-08: qty 20

## 2020-04-08 NOTE — TOC CAGE-AID Note (Signed)
Transition of Care [facility]) - CAGE-AID Screening   Patient Details  Name: Kendra Allison MRN: [id number] Date of Birth: 10-12-1955  Transition of [facility] CM/SW Contact:    Emeterio Reeve, [place] Phone Number: 04/08/2020, 3:17 PM   Clinical Narrative:  CSW met with pt at bedside. CSW introduced self and explained role at the hospital.  Pt reports an occasional glass of wine. Pt denies substance use. Pt did not need any resources at this time.    CAGE-AID Screening:    Have You Ever Felt You Ought to Cut Down on Your Drinking or Drug Use?: No Have People Annoyed You By Critizing Your Drinking Or Drug Use?: No Have You Felt Bad Or Guilty About Your Drinking Or Drug Use?: No Have You Ever Had a Drink or Used Drugs First Thing In The Morning to Steady Your Nerves or to Get Rid of a Hangover?: No CAGE-AID Score: 0  Substance Abuse Education Offered: Yes    Blima Ledger, [place] Social Worker [phone]

## 2020-04-08 NOTE — Progress Notes (Signed)
Orthopaedic Trauma Progress Note  SUBJECTIVE: Reports mild pain about operative site, improved some from last night. No chest pain. No SOB. No nausea/vomiting. No other complaints. Feels ready to get out of bed and work with therapies today.   OBJECTIVE:  Vitals:   04/08/20 0102 04/08/20 0500  BP: 124/74 133/84  Pulse: 82 75  Resp: 18 18  Temp: 98.1 F (36.7 C) 98.6 F (37 C)  SpO2: 100% 100%    General: Sitting up in bed, NAD Respiratory: No increased work of breathing.  Left Lower Extremity: Ex-fix in place. Dressings CDI. Incisional vac with good seal and function,. No output in canister currently. Able to wiggle toes. Endorses sensation to light touch over dorsal and plantar aspect of foot. +DP pulse  IMAGING: Stable post op x-rays. CT scan pending  LABS:  Results for orders placed or performed during the hospital encounter of 04/07/20 (from the past 24 hour(s))  Respiratory Panel by RT PCR (Flu A&B, Covid) - Nasopharyngeal Swab     Status: None   Collection Time: 04/07/20  3:50 PM   Specimen: Nasopharyngeal Swab  Result Value Ref Range   SARS Coronavirus 2 by RT PCR NEGATIVE NEGATIVE   Influenza A by PCR NEGATIVE NEGATIVE   Influenza B by PCR NEGATIVE NEGATIVE  CBC with Differential     Status: Abnormal   Collection Time: 04/07/20  4:53 PM  Result Value Ref Range   WBC 14.4 (H) 4.0 - 10.5 K/uL   RBC 3.91 3.87 - 5.11 MIL/uL   Hemoglobin 11.8 (L) 12.0 - 15.0 g/dL   HCT 37.8 36 - 46 %   MCV 96.7 80.0 - 100.0 fL   MCH 30.2 26.0 - 34.0 pg   MCHC 31.2 30.0 - 36.0 g/dL   RDW 12.1 11.5 - 15.5 %   Platelets 356 150 - 400 K/uL   nRBC 0.0 0.0 - 0.2 %   Neutrophils Relative % 82 %   Neutro Abs 11.8 (H) 1.7 - 7.7 K/uL   Lymphocytes Relative 11 %   Lymphs Abs 1.6 0.7 - 4.0 K/uL   Monocytes Relative 6 %   Monocytes Absolute 0.9 0.1 - 1.0 K/uL   Eosinophils Relative 0 %   Eosinophils Absolute 0.0 0.0 - 0.5 K/uL   Basophils Relative 1 %   Basophils Absolute 0.1 0.0 - 0.1 K/uL    Immature Granulocytes 0 %   Abs Immature Granulocytes 0.05 0.00 - 0.07 K/uL  Basic metabolic panel     Status: Abnormal   Collection Time: 04/07/20  4:54 PM  Result Value Ref Range   Sodium 140 135 - 145 mmol/L   Potassium 3.9 3.5 - 5.1 mmol/L   Chloride 106 98 - 111 mmol/L   CO2 23 22 - 32 mmol/L   Glucose, Bld 241 (H) 70 - 99 mg/dL   BUN 19 8 - 23 mg/dL   Creatinine, Ser 1.12 (H) 0.44 - 1.00 mg/dL   Calcium 9.3 8.9 - 10.3 mg/dL   GFR, Estimated 55 (L) >60 mL/min   Anion gap 11 5 - 15  Protime-INR     Status: None   Collection Time: 04/07/20  4:54 PM  Result Value Ref Range   Prothrombin Time 12.7 11.4 - 15.2 seconds   INR 1.0 0.8 - 1.2  Basic metabolic panel     Status: Abnormal   Collection Time: 04/08/20  3:32 AM  Result Value Ref Range   Sodium 139 135 - 145 mmol/L   Potassium 4.3 3.5 -  5.1 mmol/L   Chloride 104 98 - 111 mmol/L   CO2 24 22 - 32 mmol/L   Glucose, Bld 169 (H) 70 - 99 mg/dL   BUN 13 8 - 23 mg/dL   Creatinine, Ser 1.15 (H) 0.44 - 1.00 mg/dL   Calcium 9.4 8.9 - 10.3 mg/dL   GFR, Estimated 54 (L) >60 mL/min   Anion gap 11 5 - 15  CBC     Status: Abnormal   Collection Time: 04/08/20  3:32 AM  Result Value Ref Range   WBC 8.2 4.0 - 10.5 K/uL   RBC 3.80 (L) 3.87 - 5.11 MIL/uL   Hemoglobin 11.5 (L) 12.0 - 15.0 g/dL   HCT 35.3 (L) 36 - 46 %   MCV 92.9 80.0 - 100.0 fL   MCH 30.3 26.0 - 34.0 pg   MCHC 32.6 30.0 - 36.0 g/dL   RDW 12.3 11.5 - 15.5 %   Platelets 340 150 - 400 K/uL   nRBC 0.0 0.0 - 0.2 %    ASSESSMENT: Kendra Allison is a 64 y.o. female s/p fall through ceiling, 1 Day Post-Op  Injuries: left open pilon fracture s/p I&D and placement of ex-fix left leg  CV/Blood loss: Acute blood loss anemia, Hgb 11.5 this morning. Hemodynamically stable  PLAN: Weightbearing: NWB LLE Incisional and dressing care:  Leave incisional vac and ace wrap in place Showering: Hold off for now Orthopedic device(s): Wound Vac:LLE and ex-fix  Pain management:   1. Tylenol 325-650 mg q 6 hours PRN 2. Robaxin 500 mg q 6 hours PRN 3. Norco 5-325 mg q 4 hours PRN moderate pain OR Norco 7.5-325 g q 4 hours PRN severe pain 4. Neurontin 100 mg TID 5. Morphine 0.5-1 mg q 2 hours PRN VTE prophylaxis: Lovenox, SCDs ID: Ceftriaxone post op for open fracture prophylaxis Foley/Lines:  No foley, KVO IVFs Impediments to Fracture Healing: Vit d lvel pending, will start supplementation as indicated  Dispo: Therapies as tolerated. Will continue to monitor swelling and plan to return to OR likely Wednesday for removal of ex-fix and definitve fixation Okay for discharge from ortho standpoint once cleared by medicine team and therapies  Follow - up plan: 2 weeks  Contact information:  Katha Hamming MD, [name] PA-C. After hours and holidays please check [email or web address] for group call information for [facility]   Kendra Dagher A. Ricci Barker, PA-C [phone] (office) [email or web address]

## 2020-04-08 NOTE — Evaluation (Signed)
Occupational Therapy Evaluation Patient Details Name: Kendra Allison MRN: [id number] DOB: October 30, 1955 Today's Date: 04/08/2020    History of Present Illness Patient is a 64 y/o female s/p fall through ceiling and sustained a L open pilon fx. Patient underwent L tibia external fixation and I&D on 11/14. Patient's PMH includes asthma, GERD, hypercholesteremia, and HTN.   Clinical Impression   Pt PTA:Pt lives alone with family nearby to assist. Pt currently minguardA +2 for lines and to assist pt to adhere to NWB on LLE with RW. Pt with good safety awareness for all functional ADL and functional mobility. Pt supervisionA to minguardA overall for ADL in sitting/standing. Pt would benefit from continued OT skilled services for ADL, mobility and safety acutely. OT following acutely.    Follow Up Recommendations  Home health OT;Follow surgeon's recommendation for DC plan and follow-up therapies;Supervision - Intermittent    Equipment Recommendations  3 in 1 bedside commode    Recommendations for Other Services       Precautions / Restrictions Precautions Precautions: Fall Precaution Comments: L ex fix; wound vac Restrictions Weight Bearing Restrictions: Yes LLE Weight Bearing: Non weight bearing      Mobility Bed Mobility Overal bed mobility: Needs Assistance Bed Mobility: Supine to Sit     Supine to sit: Supervision;HOB elevated (use of trapeze)     General bed mobility comments: use of trapeze to assist to EOB and HOB elevated    Transfers Overall transfer level: Needs assistance Equipment used: Rolling walker (2 wheeled) Transfers: Sit to/from Stand Sit to Stand: Supervision         General transfer comment: Pt able to adhere to NWB status    Balance Overall balance assessment: Needs assistance Sitting-balance support: No upper extremity supported;Feet supported Sitting balance-[name]: Good     Standing balance support: Bilateral upper extremity  supported;During functional activity Standing balance-[name]: Fair                             ADL either performed or assessed with clinical judgement   ADL Overall ADL's : Needs assistance/impaired Eating/Feeding: Set up;Sitting   Grooming: Supervision/safety;Standing;Cueing for safety;Cueing for sequencing Grooming Details (indicate cue type and reason): standing and seated for task Upper Body Bathing: Supervision/ safety;Sitting   Lower Body Bathing: Supervison/ safety;Cueing for safety;Cueing for sequencing;Sitting/lateral leans;Sit to/from stand   Upper Body Dressing : Set up;Sitting;Standing   Lower Body Dressing: Set up;Cueing for safety;Cueing for sequencing;Sitting/lateral leans;Sit to/from stand   Toilet Transfer: Min guard;RW;Grab bars   Toileting- Water quality scientist and Hygiene: Min guard;Cueing for safety;Sitting/lateral lean;Sit to/from stand       Functional mobility during ADLs: Min guard;Cueing for safety;Cueing for sequencing;Rolling walker General ADL Comments: Pt supervisionA to minguardA overall for ADL in sitting/standing.     Vision Baseline Vision/History: No visual deficits Patient Visual Report: No change from baseline Vision Assessment?: No apparent visual deficits     Perception     Praxis      Pertinent Vitals/Pain Pain Assessment: 0-10 Pain Score: 4  Pain Location: L lower leg/ankle Pain Descriptors / Indicators: Grimacing;Guarding;Operative site guarding Pain Intervention(s): Monitored during session;Premedicated before session     Hand Dominance Right   Extremity/Trunk Assessment Upper Extremity Assessment Upper Extremity Assessment: Generalized weakness   Lower Extremity Assessment Lower Extremity Assessment: LLE deficits/detail;Defer to PT evaluation LLE Deficits / Details: s/p ex fix for L ankle fx   Cervical / Trunk Assessment Cervical / Trunk Assessment:  Normal   Communication  Communication Communication: No difficulties   Cognition Arousal/Alertness: Awake/alert Behavior During Therapy: WFL for tasks assessed/performed Overall Cognitive Status: Within Functional Limits for tasks assessed                                     General Comments  pt very motivated to return home with family to check in; pt performing ADL tasks today in sitting and standing with good safety awareness.    Exercises     Shoulder Instructions      Home Living Family/patient expects to be discharged to:: Private residence Living Arrangements: Alone Available Help at Discharge: Family;Available PRN/intermittently Type of Home: House Home Access: Level entry     Home Layout: One level     Bathroom Shower/Tub: Occupational psychologist: Standard     Home Equipment: Shower seat - built in          Prior Functioning/Environment Level of Independence: Independent        Comments: retired        OT Problem List: Decreased strength;Decreased activity tolerance;Impaired balance (sitting and/or standing);Decreased safety awareness;Pain;Increased edema;Decreased knowledge of use of DME or AE      OT Treatment/Interventions: Self-care/ADL training;Therapeutic exercise;Energy conservation;DME and/or AE instruction;Therapeutic activities;Patient/family education;Balance training    OT Goals(Current goals can be found in the care plan section) Acute Rehab OT Goals Patient Stated Goal: to go home OT Goal Formulation: With patient Time For Goal Achievement: 04/22/20 Potential to Achieve Goals: Good ADL Goals Pt Will Perform Lower Body Dressing: with modified independence;with adaptive equipment;sitting/lateral leans;sit to/from stand Pt Will Transfer to Toilet: with supervision;ambulating;bedside commode Pt Will Perform Toileting - Clothing Manipulation and hygiene: with modified independence;sitting/lateral leans;sit to/from stand Pt Will Perform  Tub/Shower Transfer: with supervision;rolling walker;ambulating  OT Frequency: Min 2X/week   Barriers to D/C:            Co-evaluation              AM-PAC OT "6 Clicks" Daily Activity     Outcome Measure Help from another person eating meals?: None Help from another person taking care of personal grooming?: A Little Help from another person toileting, which includes using toliet, bedpan, or urinal?: A Little Help from another person bathing (including washing, rinsing, drying)?: A Little Help from another person to put on and taking off regular upper body clothing?: A Little Help from another person to put on and taking off regular lower body clothing?: A Little 6 Click Score: 19   End of Session Equipment Utilized During Treatment: Gait belt;Rolling walker;Other (comment) (ex fix) Nurse Communication: Mobility status;Weight bearing status  Activity Tolerance: Patient limited by fatigue;Patient limited by pain Patient left: in chair;with call bell/phone within reach  OT Visit Diagnosis: Unsteadiness on feet (R26.81);Muscle weakness (generalized) (M62.81);Pain Pain - Right/Left: Left                Time: 1040-1120 OT Time Calculation (min): 40 min Charges:  OT General Charges $OT Visit: 1 Visit OT Evaluation $OT Eval Moderate Complexity: 1 Mod  Jefferey Pica, OTR/L Acute Rehabilitation Services Pager: [phone] Office: [phone]   Johnthomas Lader C 04/08/2020, 4:56 PM

## 2020-04-08 NOTE — Plan of Care (Signed)
  Problem: Health Behavior/Discharge Planning: Goal: Ability to manage health-related needs will improve Outcome: Progressing   Problem: Clinical Measurements: Goal: Ability to maintain clinical measurements within normal limits will improve Outcome: Progressing Goal: Will remain free from infection Outcome: Progressing Goal: Diagnostic test results will improve Outcome: Progressing Goal: Respiratory complications will improve Outcome: Progressing Goal: Cardiovascular complication will be avoided Outcome: Progressing   Problem: Pain Managment: Goal: General experience of comfort will improve Outcome: Progressing   Problem: Safety: Goal: Ability to remain free from injury will improve Outcome: Progressing   Problem: Skin Integrity: Goal: Risk for impaired skin integrity will decrease Outcome: Progressing   Problem: Education: Goal: Required Educational Video(s) Outcome: Progressing   Problem: Clinical Measurements: Goal: Ability to maintain clinical measurements within normal limits will improve Outcome: Progressing Goal: Postoperative complications will be avoided or minimized Outcome: Progressing   Problem: Skin Integrity: Goal: Demonstration of wound healing without infection will improve Outcome: Progressing

## 2020-04-08 NOTE — Evaluation (Signed)
Physical Therapy Evaluation Patient Details Name: Kendra Allison MRN: [id number] DOB: 12/16/1955 Today's Date: 04/08/2020   History of Present Illness  Patient is a 64 y/o female s/p fall through ceiling and sustained a L open pilon fx. Patient underwent L tibia external fixation and I&D on 11/14. Patient's PMH includes asthma, GERD, hypercholesteremia, and HTN.  Clinical Impression  PTA, patient lives alone and independent with all mobility. Patient required supervision for bed mobility and transfers with RW. Patient ambulated 20' with RW and min guard for safety, able to maintain NWB on L throughout without cueing. Patient will benefit from skilled PT services to address listed deficits (see PT Problem list). Recommend HHPT following discharge at this time, however pending progress, may recommend OPPT.     Follow Up Recommendations Home health PT (pending progress, recommend OPPT)    Equipment Recommendations  Rolling Kendra Allison with 5" wheels;3in1 (PT)    Recommendations for Other Services       Precautions / Restrictions Precautions Precautions: Fall Precaution Comments: L ex fix  Restrictions Weight Bearing Restrictions: Yes LLE Weight Bearing: Non weight bearing      Mobility  Bed Mobility Overal bed mobility: Needs Assistance Bed Mobility: Supine to Sit     Supine to sit: Supervision;HOB elevated (use of trapeze)     General bed mobility comments: use of trapeze to assist to EOB and HOB elevated    Transfers Overall transfer level: Needs assistance Equipment used: Rolling [name] (2 wheeled) Transfers: Sit to/from Stand Sit to Stand: Supervision         General transfer comment: supervision required for sit to stand with RW. Patient able to maintain NWB on L during transfer  Ambulation/Gait Ambulation/Gait assistance: Min guard Gait Distance (Feet): 20 Feet Assistive device: Rolling [name] (2 wheeled) Gait Pattern/deviations: Step-to pattern     General  Gait Details: min guard for safety  Stairs            Wheelchair Mobility    Modified Rankin (Stroke Patients Only)       Balance Overall balance assessment: Needs assistance Sitting-balance support: No upper extremity supported;Feet supported Sitting balance-[name]: Good     Standing balance support: Bilateral upper extremity supported;During functional activity Standing balance-[name]: Fair                               Pertinent Vitals/Pain Pain Assessment: Faces Faces Pain Scale: Hurts little more Pain Location: L lower leg/ankle Pain Descriptors / Indicators: Grimacing;Guarding;Operative site guarding Pain Intervention(s): Limited activity within patient's tolerance;Monitored during session;Repositioned    Home Living Family/patient expects to be discharged to:: Private residence Living Arrangements: Alone Available Help at Discharge: Family;Available PRN/intermittently Type of Home: House Home Access: Level entry     Home Layout: One level Home Equipment: Shower seat - built in      Prior Function Level of Independence: Independent         Comments: retired     Journalist, newspaper        Extremity/Trunk Assessment   Upper Extremity Assessment Upper Extremity Assessment: Defer to OT evaluation    Lower Extremity Assessment Lower Extremity Assessment: LLE deficits/detail LLE: Unable to fully assess due to immobilization       Communication   Communication: No difficulties  Cognition Arousal/Alertness: Awake/alert Behavior During Therapy: WFL for tasks assessed/performed Overall Cognitive Status: Within Functional Limits for tasks assessed  General Comments      Exercises     Assessment/Plan    PT Assessment Patient needs continued PT services  PT Problem List Decreased strength;Decreased activity tolerance;Decreased balance;Decreased mobility       PT  Treatment Interventions DME instruction;Gait training;Functional mobility training;Therapeutic activities;Therapeutic exercise;Balance training;Patient/family education;Wheelchair mobility training    PT Goals (Current goals can be found in the Care Plan section)  Acute Rehab PT Goals Patient Stated Goal: to go home PT Goal Formulation: With patient Time For Goal Achievement: 04/22/20 Potential to Achieve Goals: Good    Frequency Min 5X/week   Barriers to discharge        Co-evaluation               AM-PAC PT "6 Clicks" Mobility  Outcome Measure Help needed turning from your back to your side while in a flat bed without using bedrails?: A Little Help needed moving from lying on your back to sitting on the side of a flat bed without using bedrails?: A Little Help needed moving to and from a bed to a chair (including a wheelchair)?: A Little Help needed standing up from a chair using your arms (e.g., wheelchair or bedside chair)?: A Little Help needed to walk in hospital room?: A Little Help needed climbing 3-5 steps with a railing? : A Little 6 Click Score: 18    End of Session Equipment Utilized During Treatment: Gait belt Activity Tolerance: Patient tolerated treatment well Patient left: in chair;with call bell/phone within reach;with nursing/sitter in room Nurse Communication: Mobility status PT Visit Diagnosis: Unsteadiness on feet (R26.81);Other abnormalities of gait and mobility (R26.89);Muscle weakness (generalized) (M62.81)    Time: 1040-1103 PT Time Calculation (min) (ACUTE ONLY): 23 min   Charges:   PT Evaluation $PT Eval Moderate Complexity: 1 Mod          Kendra Allison, PT, DPT Acute Rehabilitation Services Pager [phone] Office [phone]   Kendra Allison 04/08/2020, 12:30 PM

## 2020-04-08 NOTE — Progress Notes (Signed)
Received from Signe Colt  Patient alert and oriented x 4  Patient awake upon entering the room, expressed the need for pain medication, will medicate accordingly.   IV infusing KVO.   Bedside table and call bell in reach for patient safety.

## 2020-04-09 ENCOUNTER — Encounter (HOSPITAL_COMMUNITY): Payer: Self-pay | Admitting: Student

## 2020-04-09 LAB — CBC
HCT: 30.6 % — ABNORMAL LOW (ref 36.0–46.0)
Hemoglobin: 9.7 g/dL — ABNORMAL LOW (ref 12.0–15.0)
MCH: 30 pg (ref 26.0–34.0)
MCHC: 31.7 g/dL (ref 30.0–36.0)
MCV: 94.7 fL (ref 80.0–100.0)
Platelets: 290 10*3/uL (ref 150–400)
RBC: 3.23 MIL/uL — ABNORMAL LOW (ref 3.87–5.11)
RDW: 12.5 % (ref 11.5–15.5)
WBC: 8.8 10*3/uL (ref 4.0–10.5)
nRBC: 0 % (ref 0.0–0.2)

## 2020-04-09 LAB — BASIC METABOLIC PANEL
Anion gap: 7 (ref 5–15)
BUN: 17 mg/dL (ref 8–23)
CO2: 29 mmol/L (ref 22–32)
Calcium: 9.2 mg/dL (ref 8.9–10.3)
Chloride: 104 mmol/L (ref 98–111)
Creatinine, Ser: 1.06 mg/dL — ABNORMAL HIGH (ref 0.44–1.00)
GFR, Estimated: 59 mL/min — ABNORMAL LOW (ref >60)
Glucose, Bld: 115 mg/dL — ABNORMAL HIGH (ref 70–99)
Potassium: 4.2 mmol/L (ref 3.5–5.1)
Sodium: 140 mmol/L (ref 135–145)

## 2020-04-09 MED ORDER — MUPIROCIN 2 % EX OINT
1.0000 "application " | TOPICAL_OINTMENT | Freq: Two times a day (BID) | CUTANEOUS | Status: DC
  Administered 2020-04-09 – 2020-04-13 (×5): 1 via NASAL
  Filled 2020-04-09 (×3): qty 22

## 2020-04-09 NOTE — Progress Notes (Signed)
Physical Therapy Treatment Patient Details Name: Kendra Allison MRN: [id number] DOB: 1955/10/10 Today's Date: 04/09/2020    History of Present Illness Patient is a 64 y/o female s/p fall through ceiling and sustained a L open pilon fx. Patient underwent L tibia external fixation and I&D on 11/14. Patient's PMH includes asthma, GERD, hypercholesteremia, and HTN.    PT Comments    Patient progressing towards physical therapy goals. Patient performs bed mobility and transfers with supervision. Patient ambulated 20' with RW and min guard for safety. Performed sit to stand x10 and dynamic standing balance with close supervision while performing ADLs at sink, x1 LOB with minA to steady. Anticipate no PT follow up following discharge at this time. Recommend OPPT following WB restriction change.     Follow Up Recommendations  No PT follow up (Recommend OPPT following WB restriction change)     Equipment Recommendations  Rolling Rondi Ivy with 5" wheels;3in1 (PT)    Recommendations for Other Services       Precautions / Restrictions Precautions Precautions: Fall Precaution Comments: L ex fix; wound vac Restrictions Weight Bearing Restrictions: Yes LLE Weight Bearing: Non weight bearing    Mobility  Bed Mobility Overal bed mobility: Needs Assistance Bed Mobility: Supine to Sit     Supine to sit: Supervision;HOB elevated        Transfers Overall transfer level: Needs assistance Equipment used: Rolling Ronnita Paz (2 wheeled) Transfers: Sit to/from Stand Sit to Stand: Supervision         General transfer comment: Pt able to adhere to NWB status  Ambulation/Gait Ambulation/Gait assistance: Min guard Gait Distance (Feet): 20 Feet Assistive device: Rolling Abhijot Straughter (2 wheeled) Gait Pattern/deviations: Step-to pattern     General Gait Details: min guard for safety   Stairs             Wheelchair Mobility    Modified Rankin (Stroke Patients Only)       Balance  Overall balance assessment: Needs assistance Sitting-balance support: No upper extremity supported;Feet supported Sitting balance-[name]: Good     Standing balance support: During functional activity;Single extremity supported Standing balance-[name]: Fair Standing balance comment: dynamic standing balance at sink with reaching for objects                            Cognition Arousal/Alertness: Awake/alert Behavior During Therapy: WFL for tasks assessed/performed Overall Cognitive Status: Within Functional Limits for tasks assessed                                        Exercises Other Exercises Other Exercises: sit to stand x 10 while performing ADLs at sink, x1 LOB with minA to steady Other Exercises: dynamic standing balance with reaching for various objects while performing ADLs sinkside    General Comments        Pertinent Vitals/Pain Pain Assessment: Faces Faces Pain Scale: Hurts a little bit Pain Location: L lower leg/ankle Pain Descriptors / Indicators: Grimacing;Guarding;Operative site guarding Pain Intervention(s): Limited activity within patient's tolerance;Monitored during session;Repositioned    Home Living                      Prior Function            PT Goals (current goals can now be found in the care plan section) Acute Rehab PT Goals Patient Stated Goal:  to go home PT Goal Formulation: With patient Time For Goal Achievement: 04/22/20 Potential to Achieve Goals: Good Progress towards PT goals: Progressing toward goals    Frequency    Min 5X/week      PT Plan Current plan remains appropriate    Co-evaluation              AM-PAC PT "6 Clicks" Mobility   Outcome Measure  Help needed turning from your back to your side while in a flat bed without using bedrails?: None Help needed moving from lying on your back to sitting on the side of a flat bed without using bedrails?: None Help needed  moving to and from a bed to a chair (including a wheelchair)?: A Little Help needed standing up from a chair using your arms (e.g., wheelchair or bedside chair)?: None Help needed to walk in hospital room?: A Little Help needed climbing 3-5 steps with a railing? : A Little 6 Click Score: 21    End of Session Equipment Utilized During Treatment: Gait belt Activity Tolerance: Patient tolerated treatment well Patient left: in chair;with call bell/phone within reach Nurse Communication: Mobility status PT Visit Diagnosis: Unsteadiness on feet (R26.81);Other abnormalities of gait and mobility (R26.89);Muscle weakness (generalized) (M62.81)     Time: 8453-6468 PT Time Calculation (min) (ACUTE ONLY): 32 min  Charges:  $Therapeutic Activity: 23-37 mins                     [name], PT, DPT Acute Rehabilitation Services Pager [phone] Office [phone]    Kendra Allison 04/09/2020, 11:26 AM

## 2020-04-09 NOTE — Progress Notes (Addendum)
Orthopaedic Trauma Progress Note  SUBJECTIVE: Had a lot of pain in the left ankle after working with therapies yesterday but reports mild pain about operative site this morning. Pain meds helping. Sore all over from her fall but denies any significant pain in any of her other extremities currently.  Note she has not had a bowel movement since before admission.  Has been drinking some prune juice this morning for hopeful that this will get things moving today.  No chest pain. No SOB. No nausea/vomiting. No other complaints.   OBJECTIVE:  Vitals:   04/08/20 2100 04/09/20 0455  BP: 137/90 (!) 155/89  Pulse: 74 79  Resp: 20 18  Temp: 99.1 F (37.3 C) 98.2 F (36.8 C)  SpO2: 100% 98%    General: Sitting up in bed, NAD Respiratory: No increased work of breathing.  Left Lower Extremity: Ex-fix in place.  Ace wrap is clean, dry, intact.  Proximal pin sites with minimal serosanguineous drainage.  Incisional vac with good seal and function. No output in canister currently.  Moderate swelling through the lower leg and ankle with minimal to no skin wrinkling.  Able to wiggle toes. Endorses sensation to light touch over dorsal and plantar aspect of foot. +DP pulse  IMAGING: Stable post op imaging LABS:  Results for orders placed or performed during the hospital encounter of 04/07/20 (from the past 24 hour(s))  Basic metabolic panel     Status: Abnormal   Collection Time: 04/09/20  1:50 AM  Result Value Ref Range   Sodium 140 135 - 145 mmol/L   Potassium 4.2 3.5 - 5.1 mmol/L   Chloride 104 98 - 111 mmol/L   CO2 29 22 - 32 mmol/L   Glucose, Bld 115 (H) 70 - 99 mg/dL   BUN 17 8 - 23 mg/dL   Creatinine, Ser 1.06 (H) 0.44 - 1.00 mg/dL   Calcium 9.2 8.9 - 10.3 mg/dL   GFR, Estimated 59 (L) >60 mL/min   Anion gap 7 5 - 15  CBC     Status: Abnormal   Collection Time: 04/09/20  1:50 AM  Result Value Ref Range   WBC 8.8 4.0 - 10.5 K/uL   RBC 3.23 (L) 3.87 - 5.11 MIL/uL   Hemoglobin 9.7 (L) 12.0 -  15.0 g/dL   HCT 30.6 (L) 36 - 46 %   MCV 94.7 80.0 - 100.0 fL   MCH 30.0 26.0 - 34.0 pg   MCHC 31.7 30.0 - 36.0 g/dL   RDW 12.5 11.5 - 15.5 %   Platelets 290 150 - 400 K/uL   nRBC 0.0 0.0 - 0.2 %    ASSESSMENT: Kendra Allison is a 64 y.o. female s/p fall through ceiling, 2 Days Post-Op  Injuries: left open pilon fracture s/p I&D and placement of ex-fix left leg  CV/Blood loss: Acute blood loss anemia, Hgb 9.7 this morning. Hemodynamically stable  PLAN: Weightbearing: NWB LLE Incisional and dressing care:  Leave incisional vac and ace wrap in place Showering: Hold off for now Orthopedic device(s): Wound Vac:LLE and ex-fix  Pain management:  1. Tylenol 325-650 mg q 6 hours PRN 2. Robaxin 500 mg q 6 hours PRN 3. Norco 5-325 mg q 4 hours PRN moderate pain OR Norco 7.5-325 g q 4 hours PRN severe pain 4. Neurontin 100 mg TID 5. Morphine 0.5-1 mg q 2 hours PRN VTE prophylaxis: Lovenox, SCDs ID: Ceftriaxone post op for open fracture prophylaxis Foley/Lines:  No foley, KVO IVFs Impediments to Fracture Healing: Vit  D level 47, no need for supplementation at this time  Dispo: Therapies as tolerated. Will continue to monitor swelling and plan to return to operating room Wednesday (04/10/2020) for removal of ex-fix and intramedullary nailing of left tibia with minimal open approach of distal tibia.  NPO effective midnight.  Consent order has been placed to have patient sign today  Follow - up plan: 2 weeks  Contact information:  Katha Hamming MD, [name] PA-C. After hours and holidays please check [email or web address] for group call information for [facility]   Kendra Bernard A. Ricci Barker, PA-C [phone] (office) [email or web address]

## 2020-04-09 NOTE — Plan of Care (Signed)
  Problem: Health Behavior/Discharge Planning: Goal: Ability to manage health-related needs will improve 04/09/2020 0246 by Keturah Shavers, RN Outcome: Progressing 04/09/2020 0246 by Keturah Shavers, RN Outcome: Progressing   Problem: Clinical Measurements: Goal: Ability to maintain clinical measurements within normal limits will improve 04/09/2020 0246 by Keturah Shavers, RN Outcome: Progressing 04/09/2020 0246 by Keturah Shavers, RN Outcome: Progressing Goal: Will remain free from infection 04/09/2020 0246 by Keturah Shavers, RN Outcome: Progressing 04/09/2020 0246 by Keturah Shavers, RN Outcome: Progressing Goal: Diagnostic test results will improve 04/09/2020 0246 by Keturah Shavers, RN Outcome: Progressing 04/09/2020 0246 by Keturah Shavers, RN Outcome: Progressing Goal: Respiratory complications will improve 04/09/2020 0246 by Keturah Shavers, RN Outcome: Progressing 04/09/2020 0246 by Keturah Shavers, RN Outcome: Progressing Goal: Cardiovascular complication will be avoided 04/09/2020 0246 by Keturah Shavers, RN Outcome: Progressing 04/09/2020 0246 by Keturah Shavers, RN Outcome: Progressing   Problem: Pain Managment: Goal: General experience of comfort will improve 04/09/2020 0246 by Keturah Shavers, RN Outcome: Progressing 04/09/2020 0246 by Keturah Shavers, RN Outcome: Progressing   Problem: Safety: Goal: Ability to remain free from injury will improve 04/09/2020 0246 by Keturah Shavers, RN Outcome: Progressing 04/09/2020 0246 by Keturah Shavers, RN Outcome: Progressing   Problem: Skin Integrity: Goal: Risk for impaired skin integrity will decrease 04/09/2020 0246 by Keturah Shavers, RN Outcome: Progressing 04/09/2020 0246 by Keturah Shavers, RN Outcome: Progressing   Problem: Education: Goal: Required Educational Video(s) 04/09/2020 0246 by Keturah Shavers, RN Outcome: Progressing 04/09/2020 0246 by Keturah Shavers,  RN Outcome: Progressing   Problem: Clinical Measurements: Goal: Ability to maintain clinical measurements within normal limits will improve 04/09/2020 0246 by Keturah Shavers, RN Outcome: Progressing 04/09/2020 0246 by Keturah Shavers, RN Outcome: Progressing Goal: Postoperative complications will be avoided or minimized 04/09/2020 0246 by Keturah Shavers, RN Outcome: Progressing 04/09/2020 0246 by Keturah Shavers, RN Outcome: Progressing   Problem: Skin Integrity: Goal: Demonstration of wound healing without infection will improve 04/09/2020 0246 by Keturah Shavers, RN Outcome: Progressing 04/09/2020 0246 by Keturah Shavers, RN Outcome: Progressing

## 2020-04-09 NOTE — Plan of Care (Signed)
°  Problem: Health Behavior/Discharge Planning: Goal: Ability to manage health-related needs will improve Outcome: Progressing   Problem: Clinical Measurements: Goal: Ability to maintain clinical measurements within normal limits will improve Outcome: Progressing Goal: Will remain free from infection Outcome: Progressing Goal: Diagnostic test results will improve Outcome: Progressing Goal: Respiratory complications will improve Outcome: Progressing Goal: Cardiovascular complication will be avoided Outcome: Progressing   Problem: Pain Managment: Goal: General experience of comfort will improve Outcome: Progressing   Problem: Safety: Goal: Ability to remain free from injury will improve Outcome: Progressing   Problem: Skin Integrity: Goal: Risk for impaired skin integrity will decrease Outcome: Progressing   Problem: Education: Goal: Required Educational Video(s) Outcome: Progressing   Problem: Clinical Measurements: Goal: Ability to maintain clinical measurements within normal limits will improve Outcome: Progressing Goal: Postoperative complications will be avoided or minimized Outcome: Progressing   Problem: Skin Integrity: Goal: Demonstration of wound healing without infection will improve Outcome: Progressing

## 2020-04-10 ENCOUNTER — Inpatient Hospital Stay (HOSPITAL_COMMUNITY): Payer: Federal, State, Local not specified - PPO | Admitting: Anesthesiology

## 2020-04-10 ENCOUNTER — Encounter (HOSPITAL_COMMUNITY): Payer: Self-pay | Admitting: Student

## 2020-04-10 ENCOUNTER — Inpatient Hospital Stay (HOSPITAL_COMMUNITY): Payer: Federal, State, Local not specified - PPO

## 2020-04-10 ENCOUNTER — Encounter (HOSPITAL_COMMUNITY)
Admission: EM | Disposition: A | Discharge status: Home / Self Care | Payer: Self-pay | Source: Home / Self Care | Attending: Student

## 2020-04-10 DIAGNOSIS — S82452A Displaced comminuted fracture of shaft of left fibula, initial encounter for closed fracture: Secondary | ICD-10-CM

## 2020-04-10 DIAGNOSIS — S82202C Unspecified fracture of shaft of left tibia, initial encounter for open fracture type IIIA, IIIB, or IIIC: Secondary | ICD-10-CM

## 2020-04-10 DIAGNOSIS — W1789XA Other fall from one level to another, initial encounter: Secondary | ICD-10-CM

## 2020-04-10 DIAGNOSIS — Z9889 Other specified postprocedural states: Secondary | ICD-10-CM | POA: Diagnosis not present

## 2020-04-10 DIAGNOSIS — S82872C Displaced pilon fracture of left tibia, initial encounter for open fracture type IIIA, IIIB, or IIIC: Secondary | ICD-10-CM | POA: Diagnosis not present

## 2020-04-10 DIAGNOSIS — I10 Essential (primary) hypertension: Secondary | ICD-10-CM | POA: Diagnosis not present

## 2020-04-10 DIAGNOSIS — E785 Hyperlipidemia, unspecified: Secondary | ICD-10-CM | POA: Diagnosis not present

## 2020-04-10 DIAGNOSIS — S82872B Displaced pilon fracture of left tibia, initial encounter for open fracture type I or II: Secondary | ICD-10-CM | POA: Diagnosis not present

## 2020-04-10 DIAGNOSIS — J45909 Unspecified asthma, uncomplicated: Secondary | ICD-10-CM | POA: Diagnosis not present

## 2020-04-10 DIAGNOSIS — S82402C Unspecified fracture of shaft of left fibula, initial encounter for open fracture type IIIA, IIIB, or IIIC: Secondary | ICD-10-CM | POA: Diagnosis present

## 2020-04-10 HISTORY — PX: EXTERNAL FIXATION REMOVAL: SHX5040

## 2020-04-10 HISTORY — PX: TIBIA IM NAIL INSERTION: SHX2516

## 2020-04-10 LAB — BASIC METABOLIC PANEL
Anion gap: 8 (ref 5–15)
BUN: 15 mg/dL (ref 8–23)
CO2: 29 mmol/L (ref 22–32)
Calcium: 9.6 mg/dL (ref 8.9–10.3)
Chloride: 103 mmol/L (ref 98–111)
Creatinine, Ser: 0.97 mg/dL (ref 0.44–1.00)
GFR, Estimated: >60 mL/min (ref >60)
Glucose, Bld: 107 mg/dL — ABNORMAL HIGH (ref 70–99)
Potassium: 4.2 mmol/L (ref 3.5–5.1)
Sodium: 140 mmol/L (ref 135–145)

## 2020-04-10 LAB — SURGICAL PCR SCREEN
MRSA, PCR: NEGATIVE
Staphylococcus aureus: NEGATIVE

## 2020-04-10 SURGERY — INSERTION, INTRAMEDULLARY ROD, TIBIA
Anesthesia: General | Laterality: Left

## 2020-04-10 MED ORDER — CHLORHEXIDINE GLUCONATE 0.12 % MT SOLN
15.0000 mL | OROMUCOSAL | Status: AC
  Administered 2020-04-10: 15 mL via OROMUCOSAL
  Filled 2020-04-10: qty 15

## 2020-04-10 MED ORDER — FENTANYL CITRATE (PF) 100 MCG/2ML IJ SOLN
INTRAMUSCULAR | Status: AC
  Filled 2020-04-10: qty 2

## 2020-04-10 MED ORDER — FENTANYL CITRATE (PF) 250 MCG/5ML IJ SOLN
INTRAMUSCULAR | Status: DC | PRN
  Administered 2020-04-10 (×4): 50 ug via INTRAVENOUS
  Administered 2020-04-10 (×2): 25 ug via INTRAVENOUS

## 2020-04-10 MED ORDER — ONDANSETRON HCL 4 MG/2ML IJ SOLN
INTRAMUSCULAR | Status: DC | PRN
  Administered 2020-04-10: 4 mg via INTRAVENOUS

## 2020-04-10 MED ORDER — OXYCODONE HCL 5 MG PO TABS: 5.0000 mg | ORAL_TABLET | ORAL | Status: DC | PRN

## 2020-04-10 MED ORDER — ONDANSETRON HCL 4 MG/2ML IJ SOLN
INTRAMUSCULAR | Status: AC
  Filled 2020-04-10: qty 2

## 2020-04-10 MED ORDER — ROCURONIUM BROMIDE 10 MG/ML (PF) SYRINGE
PREFILLED_SYRINGE | INTRAVENOUS | Status: DC | PRN
  Administered 2020-04-10: 10 mg via INTRAVENOUS
  Administered 2020-04-10: 60 mg via INTRAVENOUS
  Administered 2020-04-10: 10 mg via INTRAVENOUS

## 2020-04-10 MED ORDER — OXYCODONE HCL 5 MG/5ML PO SOLN: 5.0000 mg | Freq: Once | ORAL | Status: DC | PRN

## 2020-04-10 MED ORDER — VANCOMYCIN HCL 1000 MG IV SOLR
INTRAVENOUS | Status: DC | PRN
  Administered 2020-04-10: 1000 mg

## 2020-04-10 MED ORDER — ESMOLOL HCL 100 MG/10ML IV SOLN
INTRAVENOUS | Status: DC | PRN
  Administered 2020-04-10: 30 mg via INTRAVENOUS
  Administered 2020-04-10: 20 mg via INTRAVENOUS

## 2020-04-10 MED ORDER — OXYCODONE HCL 5 MG PO TABS: 5.0000 mg | ORAL_TABLET | Freq: Once | ORAL | Status: DC | PRN

## 2020-04-10 MED ORDER — FENTANYL CITRATE (PF) 250 MCG/5ML IJ SOLN
INTRAMUSCULAR | Status: AC
  Filled 2020-04-10: qty 5

## 2020-04-10 MED ORDER — PROPOFOL 10 MG/ML IV BOLUS
INTRAVENOUS | Status: AC
  Filled 2020-04-10: qty 20

## 2020-04-10 MED ORDER — PROPOFOL 10 MG/ML IV BOLUS
INTRAVENOUS | Status: DC | PRN
  Administered 2020-04-10: 150 mg via INTRAVENOUS
  Administered 2020-04-10: 30 mg via INTRAVENOUS
  Administered 2020-04-10: 20 mg via INTRAVENOUS

## 2020-04-10 MED ORDER — LIDOCAINE 2% (20 MG/ML) 5 ML SYRINGE
INTRAMUSCULAR | Status: DC | PRN
  Administered 2020-04-10: 40 mg via INTRAVENOUS
  Administered 2020-04-10: 60 mg via INTRAVENOUS

## 2020-04-10 MED ORDER — LACTATED RINGERS IV SOLN: INTRAVENOUS | Status: DC

## 2020-04-10 MED ORDER — 0.9 % SODIUM CHLORIDE (POUR BTL) OPTIME
TOPICAL | Status: DC | PRN
  Administered 2020-04-10: 1000 mL

## 2020-04-10 MED ORDER — MIDAZOLAM HCL 5 MG/5ML IJ SOLN
INTRAMUSCULAR | Status: DC | PRN
  Administered 2020-04-10: 2 mg via INTRAVENOUS

## 2020-04-10 MED ORDER — HYDROMORPHONE HCL 1 MG/ML IJ SOLN
INTRAMUSCULAR | Status: DC | PRN
Start: 2020-04-10 — End: 2020-04-10
  Administered 2020-04-10: .5 mg via INTRAVENOUS

## 2020-04-10 MED ORDER — FENTANYL CITRATE (PF) 100 MCG/2ML IJ SOLN
25.0000 ug | INTRAMUSCULAR | Status: DC | PRN
  Administered 2020-04-10: 50 ug via INTRAVENOUS

## 2020-04-10 MED ORDER — CEFAZOLIN SODIUM-DEXTROSE 2-4 GM/100ML-% IV SOLN
2.0000 g | Freq: Three times a day (TID) | INTRAVENOUS | Status: AC
  Administered 2020-04-10 – 2020-04-11 (×3): 2 g via INTRAVENOUS
  Filled 2020-04-10 (×3): qty 100

## 2020-04-10 MED ORDER — DEXAMETHASONE SODIUM PHOSPHATE 10 MG/ML IJ SOLN
INTRAMUSCULAR | Status: DC | PRN
  Administered 2020-04-10: 4 mg via INTRAVENOUS

## 2020-04-10 MED ORDER — MIDAZOLAM HCL 2 MG/2ML IJ SOLN
INTRAMUSCULAR | Status: AC
  Filled 2020-04-10: qty 2

## 2020-04-10 MED ORDER — DEXAMETHASONE SODIUM PHOSPHATE 10 MG/ML IJ SOLN
INTRAMUSCULAR | Status: AC
  Filled 2020-04-10: qty 1

## 2020-04-10 MED ORDER — ROCURONIUM BROMIDE 10 MG/ML (PF) SYRINGE
PREFILLED_SYRINGE | INTRAVENOUS | Status: AC
  Filled 2020-04-10: qty 10

## 2020-04-10 MED ORDER — HYDROMORPHONE HCL 1 MG/ML IJ SOLN
INTRAMUSCULAR | Status: AC
  Filled 2020-04-10: qty 0.5

## 2020-04-10 MED ORDER — SUGAMMADEX SODIUM 200 MG/2ML IV SOLN
INTRAVENOUS | Status: DC | PRN
  Administered 2020-04-10: 180 mg via INTRAVENOUS

## 2020-04-10 MED ORDER — OXYCODONE HCL 5 MG PO TABS
5.0000 mg | ORAL_TABLET | ORAL | Status: DC | PRN
  Administered 2020-04-10: 5 mg via ORAL
  Filled 2020-04-10: qty 1

## 2020-04-10 MED ORDER — LIDOCAINE 2% (20 MG/ML) 5 ML SYRINGE
INTRAMUSCULAR | Status: AC
  Filled 2020-04-10: qty 5

## 2020-04-10 MED ORDER — PROMETHAZINE HCL 25 MG/ML IJ SOLN: 6.2500 mg | INTRAMUSCULAR | Status: DC | PRN

## 2020-04-10 SURGICAL SUPPLY — 76 items
BIT DRILL 2.5 X LONG (BIT) ×1
BIT DRILL 3.2 X LONG (BIT) ×1
BIT DRILL CALIBRATED 4.2 (BIT) ×1 IMPLANT
BIT DRILL SHORT 4.2 (BIT) ×2 IMPLANT
BIT DRILL X LONG 2.5 (BIT) ×1 IMPLANT
BIT DRILL X LONG 3.2 (BIT) ×1 IMPLANT
BLADE SURG 10 STRL SS (BLADE) ×4 IMPLANT
BNDG COHESIVE 4X5 TAN STRL (GAUZE/BANDAGES/DRESSINGS) ×2 IMPLANT
BNDG ELASTIC 4X5.8 VLCR STR LF (GAUZE/BANDAGES/DRESSINGS) ×2 IMPLANT
BNDG ELASTIC 6X5.8 VLCR STR LF (GAUZE/BANDAGES/DRESSINGS) ×2 IMPLANT
BNDG GAUZE ELAST 4 BULKY (GAUZE/BANDAGES/DRESSINGS) ×4 IMPLANT
BRUSH SCRUB EZ PLAIN DRY (MISCELLANEOUS) ×4 IMPLANT
CHLORAPREP W/TINT 26 (MISCELLANEOUS) ×4 IMPLANT
COVER SURGICAL LIGHT HANDLE (MISCELLANEOUS) ×4 IMPLANT
COVER WAND RF STERILE (DRAPES) ×2 IMPLANT
DRAPE C-ARM 42X72 X-RAY (DRAPES) ×2 IMPLANT
DRAPE C-ARMOR (DRAPES) ×2 IMPLANT
DRAPE HALF SHEET 40X57 (DRAPES) ×4 IMPLANT
DRAPE IMP U-DRAPE 54X76 (DRAPES) ×4 IMPLANT
DRAPE INCISE IOBAN 66X45 STRL (DRAPES) IMPLANT
DRAPE ORTHO SPLIT 77X108 STRL (DRAPES) ×2
DRAPE SURG ORHT 6 SPLT 77X108 (DRAPES) ×2 IMPLANT
DRAPE U-SHAPE 47X51 STRL (DRAPES) ×2 IMPLANT
DRILL BIT CALIBRATED 4.2 (BIT) ×2
DRILL BIT SHORT 4.2 (BIT) ×2
DRILL BIT X LONG 2.5 (BIT) ×1
DRILL BIT X LONG 3.2 (BIT) ×1
DRSG ADAPTIC 3X8 NADH LF (GAUZE/BANDAGES/DRESSINGS) ×2 IMPLANT
DRSG MEPILEX BORDER 4X4 (GAUZE/BANDAGES/DRESSINGS) ×2 IMPLANT
DRSG MEPITEL 8X12 (GAUZE/BANDAGES/DRESSINGS) ×2 IMPLANT
ELECT REM PT RETURN 9FT ADLT (ELECTROSURGICAL) ×2
ELECTRODE REM PT RTRN 9FT ADLT (ELECTROSURGICAL) ×1 IMPLANT
GAUZE SPONGE 4X4 12PLY STRL (GAUZE/BANDAGES/DRESSINGS) ×4 IMPLANT
GLOVE BIO SURGEON STRL SZ 6.5 (GLOVE) ×6 IMPLANT
GLOVE BIO SURGEON STRL SZ7.5 (GLOVE) ×8 IMPLANT
GLOVE BIOGEL PI IND STRL 6.5 (GLOVE) ×1 IMPLANT
GLOVE BIOGEL PI IND STRL 7.5 (GLOVE) ×1 IMPLANT
GLOVE BIOGEL PI INDICATOR 6.5 (GLOVE) ×1
GLOVE BIOGEL PI INDICATOR 7.5 (GLOVE) ×1
GOWN STRL REUS W/ TWL LRG LVL3 (GOWN DISPOSABLE) ×2 IMPLANT
GOWN STRL REUS W/TWL LRG LVL3 (GOWN DISPOSABLE) ×2
GUIDEWIRE 3.2X400 (WIRE) ×2 IMPLANT
K-WIRE 1.6X150 (WIRE) ×2
KIT BASIN OR (CUSTOM PROCEDURE TRAY) ×2 IMPLANT
KIT TURNOVER KIT B (KITS) ×2 IMPLANT
KWIRE 1.6X150 (WIRE) ×1 IMPLANT
MANIFOLD NEPTUNE II (INSTRUMENTS) ×2 IMPLANT
NAIL FLEXIBLE ELASTIC 3.0MM (Nail) ×2 IMPLANT
NAIL TIBIAL CANN 10MM PROX 330 (Nail) ×2 IMPLANT
NS IRRIG 1000ML POUR BTL (IV SOLUTION) ×2 IMPLANT
PACK TOTAL JOINT (CUSTOM PROCEDURE TRAY) ×2 IMPLANT
PAD ABD 8X10 STRL (GAUZE/BANDAGES/DRESSINGS) ×4 IMPLANT
PAD ARMBOARD 7.5X6 YLW CONV (MISCELLANEOUS) ×4 IMPLANT
PADDING CAST COTTON 6X4 STRL (CAST SUPPLIES) ×4 IMPLANT
REAMER ROD DEEP FLUTE 2.5X950 (INSTRUMENTS) ×2 IMPLANT
SCREW CORT 3.5X42M SELF TAP (Screw) ×2 IMPLANT
SCREW CORTEX 3.5 40MM (Screw) ×1 IMPLANT
SCREW LOCK CORT ST 3.5X40 (Screw) ×1 IMPLANT
SCREW LOCK STAR 5X32 (Screw) ×2 IMPLANT
SCREW LOCK STAR 5X42 (Screw) ×2 IMPLANT
SCREW LOCK STAR 5X48 (Screw) ×2 IMPLANT
SCREW LOCK STAR 5X50 (Screw) ×2 IMPLANT
SPONGE LAP 18X18 RF (DISPOSABLE) ×2 IMPLANT
STAPLER VISISTAT 35W (STAPLE) ×2 IMPLANT
SUT ETHILON 3 0 PS 1 (SUTURE) ×4 IMPLANT
SUT MNCRL AB 3-0 PS2 18 (SUTURE) ×2 IMPLANT
SUT MON AB 2-0 CT1 36 (SUTURE) IMPLANT
SUT VIC AB 0 CT1 27 (SUTURE) ×1
SUT VIC AB 0 CT1 27XBRD ANBCTR (SUTURE) ×1 IMPLANT
SUT VIC AB 2-0 CT1 27 (SUTURE) ×1
SUT VIC AB 2-0 CT1 TAPERPNT 27 (SUTURE) ×1 IMPLANT
TOWEL GREEN STERILE (TOWEL DISPOSABLE) ×4 IMPLANT
TOWEL GREEN STERILE FF (TOWEL DISPOSABLE) ×4 IMPLANT
UNDERPAD 30X36 HEAVY ABSORB (UNDERPADS AND DIAPERS) ×4 IMPLANT
WATER STERILE IRR 1000ML POUR (IV SOLUTION) ×2 IMPLANT
YANKAUER SUCT BULB TIP NO VENT (SUCTIONS) IMPLANT

## 2020-04-10 NOTE — Progress Notes (Signed)
Ortho Trauma Note  Seen and examined this morning.  Her swelling is appropriate for limited ORIF with intramedullary nailing of open tibia fracture.  We will plan to proceed this morning.  Risks and benefits were discussed with the patient.  Risks include but not limited to bleeding, infection, malunion, nonunion, hardware failure, hardware irritation, nerve and blood vessel injury, DVT, even the possibility anesthetic complications.  Patient agreed to proceed with surgery and consent was obtained.  Shona Needles, MD [facility] [phone] (office) [email or web address]

## 2020-04-10 NOTE — Op Note (Signed)
Orthopaedic Surgery Operative Note (CSN: [id number] ) Date of Surgery: 04/10/2020  Admit Date: 04/07/2020   Diagnoses: Pre-Op Diagnoses: Left type IIIA open tibial shaft fracture Left pilon fracture Left fibula fracture  Post-Op Diagnosis: Same  Procedures: 1. CPT 27828-Open reduction internal fixation of left pilon fracture 2. CPT 27759-Intramedullary nailing of left tibia fracture 3. CPT 27784-Open reduction intramedullary nailing of left fibula fracture 4. CPT 20694-Removal of left leg external fixation  Surgeons : Primary: Pius Byrom, Thomasene Lot, MD  Assistant: [name], PA-C  Location: OR 5   Anesthesia:General  Antibiotics: Ancef 2g preop with 1 gm vancomycin powder placed topically   Tourniquet time:None    Estimated Blood JTTS:177 mL  Complications:None   Specimens:None   Implants: Implant Name Type Inv. Item Serial No. Manufacturer Lot No. LRB No. Used Action  SCREW CORTEX 3.5 40MM - [id number] Screw SCREW CORTEX 3.5 40MM  [employer]  Left 1 Implanted  SCREW CORT 3.5X42M SELF TAP - [id number] Screw SCREW CORT 3.5X42M SELF TAP  [employer]  Left 1 Implanted  NAIL FLEXIBLE ELASTIC 3.0MM - [id number] Nail NAIL FLEXIBLE ELASTIC 3.0MM  [employer]  Left 1 Implanted  NAIL TIBIAL CANN 10MM PROX 330 - [id number] Nail NAIL TIBIAL CANN 10MM PROX 330  [employer] [id number] Left 1 Implanted  SCREW LOCK STAR 5X42 - [id number] Screw SCREW LOCK STAR 5X42  [employer]  Left 1 Implanted  SCREW LOCK STAR 5X48 - [id number] Screw SCREW LOCK STAR 5X48  [employer]  Left 1 Implanted  SCREW LOCK STAR 5X50 - [id number] Screw SCREW LOCK STAR 5X50  [employer]  Left 1 Implanted     Indications for Surgery: 64 year old female who fell through her ceiling.  She sustained a left open tibial shaft/tibial pilon fracture.  She initially presented and I took her for irrigation and debridement with external fixation.  The CT scan was obtained  postoperatively and I felt that a limited open reduction internal fixation of her pilon fracture with intramedullary nailing of her tibia and fibula fracture would be appropriate.  Risks and benefits were discussed with the patient.  Risks include but not limited to bleeding, infection, malunion, nonunion, hardware failure, hardware irritation, nerve or blood vessel injury, ankle knee stiffness, DVT, even the possibility anesthetic complications.  Patient agreed to proceed with surgery and consent was obtained.  Operative Findings: 1.  Open reduction internal fixation of left pilon fracture with an anterior lateral approach with removal of bone in the syndesmosis 2.  Open reduction and intramedullary nailing of left fibula fracture using Synthes 3.0 mm titanium elastic nail 3.  Intramedullary nailing of left tibial shaft fracture using Synthes EX 10 x 330 mm nail 4.  Removal of external fixation from left lower extremity.  Procedure: The patient was identified in the preoperative holding area. Consent was confirmed with the patient and their family and all questions were answered. The operative extremity was marked after confirmation with the patient. she was then brought back to the operating room by our anesthesia colleagues.  She was carefully transferred over to a radiolucent flat top table.  She was placed under general anesthetic.  The external fixation was then removed none sterilely.  The left lower extremity was then prepped and draped in usual sterile fashion.  A timeout was performed to verify the patient, the procedure, and the extremity.  Preoperative antibiotics were dosed.  Fluoroscopic imaging was obtained to show the unstable nature of her injury.  From preoperative planning she had an  intra-articular involvement of her anterior lateral plafond.  She also had bone fragments in her syndesmosis.  To address both of these issues I made an anterior lateral approach carried it down through  skin and subcutaneous tissue.  I exposed the anterior aspect of the tibia and incised through the anterior inferior tibiofibular ligament to access the syndesmosis.  I then used a Soil scientist to remove the bony fragments that were in the intra-articular space of the tibiofibular joint.  Once I had this cleared out I then turned my attention to the anterior lateral fragment which was nondisplaced.  I confirmed this with fluoroscopy.  I held it provisionally with a 1.6 mm K wire.  I then placed 3.5 mm positional screws from anterior to posterior to hold the reduction during intramedullary nailing.  Once I had the intra-articular plafond segment fixed I turned my attention to the fibula.  I felt that intramedullary nailing would be appropriate to restore some stability alignment and length to the tibial shaft as it was so comminuted.  Made percutaneous incision along the distal fibula.  I placed a 3.2 mm drill bit at the tip of the fibula and entered the medullary canal.  I then passed a 3.0 mm Synthes elastic nail down the center of the canal until I reached the fracture of the fibula.  Close reduction techniques were attempted.  However, with the significant comminution of the tibia it was difficult to reduce the fibular shaft to pass the elastic nail.  As result I made a percutaneous incision along the fibular shaft fracture.  I placed a reduction tenaculum to manipulate the distal shaft segment.  I was able to reduce the fracture and passed the elastic nail down the center of the canal.  I then cut the elastic nail once I felt the length with adequate and I malleted the and the nail until it was buried within the soft tissues but still accessible if it needed to be removed.  Now that I had a baseline rotation, alignment and length I then turned my attention to the nailing of the tibia.  A lateral parapatellar incision was made at the knee skin and subcutaneous tissue was incised in line with the incision.  I  released the retinaculum lateral to the patellar tendon and the patella.  I stayed extra-articular to the knee joint.  I then directed a threaded guidewire into the proximal tibia using AP and lateral fluoroscopic imaging.  I confirmed position and then used an entry reamer to enter the medullary canal.  I then passed a bent ball-tipped guidewire than the center canal and seated it into the distal metaphysis making sure that I was in the lateral third of the metaphysis to maintain appropriate alignment.  I then sequentially reamed from 8.5 mm to 11.5 mm in the tibial shaft.  In the distal segment only reamed up to 10 mm.  I chose to use a 330 mm x 10 mm nail.  This was passed down the center of the canal.  I confirmed adequate alignment of the tibia and then proceeded to use perfect circle technique to place one medial to lateral distal interlock in an oblique medial to lateral distal interlock.  No other screws would be able to be placed secondary to the fracture comminution in the distal extent of the fracture.  I then used the targeting arm to place 2 proximal interlocking screws.  The targeting arm was then removed and final fluoroscopic imaging was obtained.  The incisions were copiously irrigated.  A gram of vancomycin powder was placed into the incisions.  A layer closure of 0 Vicryl, 2-0 Vicryl and 3-0 nylon was used.  Sterile dressing consisting of Mepitel, 4 x 4's, sterile cast padding and a well-padded short leg splint was applied to the leg.  The patient was then awoken from anesthesia and taken to the PACU in stable condition.  Post Op Plan/Instructions: Patient will be nonweightbearing to the left lower extremity.  She will receive postoperative Ancef.  She will continue to receive Lovenox for DVT prophylaxis.  She will continue to mobilize with physical and Occupational Therapy.  I was present and performed the entire surgery.  [name], PA-C did assist me throughout the case. An  assistant was necessary given the difficulty in approach, maintenance of reduction and ability to instrument the fracture.   Katha Hamming, MD [facility]

## 2020-04-10 NOTE — Anesthesia Postprocedure Evaluation (Signed)
Anesthesia Post Note  Patient: Kendra Allison  Procedure(s) Performed: INTRAMEDULLARY (IM) NAIL TIBIAL (Left ) REMOVAL EXTERNAL FIXATION LEG (Left )     Patient location during evaluation: PACU Anesthesia Type: General Level of consciousness: awake and alert Pain management: pain level controlled Vital Signs Assessment: post-procedure vital signs reviewed and stable Respiratory status: spontaneous breathing, nonlabored ventilation and respiratory function stable Cardiovascular status: blood pressure returned to baseline and stable Postop Assessment: no apparent nausea or vomiting Anesthetic complications: no   No complications documented.  Last Vitals:  Vitals:   04/10/20 1415 04/10/20 1436  BP: 124/82 (!) 152/84  Pulse: 80 86  Resp: 14 16  Temp: 37 C 37.1 C  SpO2: 99% 100%    Last Pain:  Vitals:   04/10/20 1436  TempSrc: Oral  PainSc:                  Audry Pili

## 2020-04-10 NOTE — Transfer of Care (Signed)
Immediate Anesthesia Transfer of Care Note  Patient: Kendra Allison  Procedure(s) Performed: INTRAMEDULLARY (IM) NAIL TIBIAL (Left ) REMOVAL EXTERNAL FIXATION LEG (Left )  Patient Location: PACU  Anesthesia Type:General  Level of Consciousness: drowsy  Airway & Oxygen Therapy: Patient Spontanous Breathing  Post-op Assessment: Report given to RN and Post -op Vital signs reviewed and stable  Post vital signs: Reviewed and stable  Last Vitals:  Vitals Value Taken Time  BP    Temp    Pulse    Resp    SpO2      Last Pain:  Vitals:   04/10/20 0437  TempSrc: Oral  PainSc:       Patients Stated Pain Goal: 0 (17/49/44 9675)  Complications: No complications documented.

## 2020-04-10 NOTE — Progress Notes (Signed)
OT Cancellation Note  Patient Details Name: Kendra Allison MRN: [id number] DOB: May 13, 1956   Cancelled Treatment:    Reason Eval/Treat Not Completed: Patient at procedure or test/ unavailable (Pt is in OR for limited ORIF of LLE tibia. OT to follow.)   OT to re-eval/re-assess s/p sx and await any new weight bearing orders.   Jefferey Pica, OTR/L Acute Rehabilitation Services Pager: [phone] Office: [phone]  [place] 04/10/2020, 10:02 AM

## 2020-04-10 NOTE — Plan of Care (Signed)
  Problem: Health Behavior/Discharge Planning: Goal: Ability to manage health-related needs will improve Outcome: Progressing   Problem: Clinical Measurements: Goal: Ability to maintain clinical measurements within normal limits will improve Outcome: Progressing Goal: Will remain free from infection Outcome: Progressing Goal: Diagnostic test results will improve Outcome: Progressing Goal: Respiratory complications will improve Outcome: Progressing Goal: Cardiovascular complication will be avoided Outcome: Progressing   Problem: Pain Managment: Goal: General experience of comfort will improve Outcome: Progressing   Problem: Safety: Goal: Ability to remain free from injury will improve Outcome: Progressing   Problem: Skin Integrity: Goal: Risk for impaired skin integrity will decrease Outcome: Progressing   Problem: Education: Goal: Required Educational Video(s) Outcome: Progressing   Problem: Clinical Measurements: Goal: Ability to maintain clinical measurements within normal limits will improve Outcome: Progressing Goal: Postoperative complications will be avoided or minimized Outcome: Progressing   Problem: Skin Integrity: Goal: Demonstration of wound healing without infection will improve Outcome: Progressing

## 2020-04-10 NOTE — Progress Notes (Signed)
PT Cancellation Note  Patient Details Name: Kendra Allison MRN: [id number] DOB: 05-Feb-1956   Cancelled Treatment:    Reason Eval/Treat Not Completed: (P) Patient at procedure or test/unavailable Pt at OR for surgery, will follow up next date per PT POC as schedule permits.   [name] 04/10/2020, 9:39 AM

## 2020-04-10 NOTE — Anesthesia Procedure Notes (Signed)
Procedure Name: Intubation Performed by: Milford Cage, CRNA Pre-anesthesia Checklist: Patient identified, Emergency Drugs available, Suction available and Patient being monitored Patient Re-evaluated:Patient Re-evaluated prior to induction Oxygen Delivery Method: Circle System Utilized Preoxygenation: Pre-oxygenation with 100% oxygen Induction Type: IV induction Ventilation: Mask ventilation without difficulty Laryngoscope Size: Miller and 2 Grade View: Grade II Tube type: Oral Tube size: 7.0 mm Number of attempts: 1 Airway Equipment and Method: Stylet and Oral airway Placement Confirmation: ETT inserted through vocal cords under direct vision,  positive ETCO2 and breath sounds checked- equal and bilateral Secured at: 21 cm Tube secured with: Tape Dental Injury: Teeth and Oropharynx as per pre-operative assessment

## 2020-04-10 NOTE — Anesthesia Preprocedure Evaluation (Addendum)
Anesthesia Evaluation  Patient identified by MRN, date of birth, ID band Patient awake    Reviewed: Allergy & Precautions, NPO status , Patient's Chart, lab work & pertinent test results  History of Anesthesia Complications Negative for: history of anesthetic complications  Airway Mallampati: II  TM Distance: >3 FB Neck ROM: Full    Dental  (+) Dental Advisory Given   Pulmonary asthma ,    Pulmonary exam normal        Cardiovascular hypertension, Pt. on medications Normal cardiovascular exam     Neuro/Psych negative neurological ROS  negative psych ROS   GI/Hepatic Neg liver ROS, GERD  Controlled and Medicated,  Endo/Other  negative endocrine ROS  Renal/GU negative Renal ROS     Musculoskeletal negative musculoskeletal ROS (+)   Abdominal   Peds  Hematology  (+) anemia ,   Anesthesia Other Findings Covid test negative   Reproductive/Obstetrics                            Anesthesia Physical Anesthesia Plan  ASA: III  Anesthesia Plan: General   Post-op Pain Management:    Induction: Intravenous  PONV Risk Score and Plan: 4 or greater and Treatment may vary due to age or medical condition, Ondansetron, Midazolam and Dexamethasone  Airway Management Planned: Oral ETT  Additional Equipment: None  Intra-op Plan:   Post-operative Plan: Extubation in OR  Informed Consent: I have reviewed the patients History and Physical, chart, labs and discussed the procedure including the risks, benefits and alternatives for the proposed anesthesia with the patient or authorized representative who has indicated his/her understanding and acceptance.     Dental advisory given  Plan Discussed with: CRNA and Anesthesiologist  Anesthesia Plan Comments:        Anesthesia Quick Evaluation

## 2020-04-11 LAB — CBC
HCT: 26.1 % — ABNORMAL LOW (ref 36.0–46.0)
Hemoglobin: 8.4 g/dL — ABNORMAL LOW (ref 12.0–15.0)
MCH: 30.1 pg (ref 26.0–34.0)
MCHC: 32.2 g/dL (ref 30.0–36.0)
MCV: 93.5 fL (ref 80.0–100.0)
Platelets: 279 10*3/uL (ref 150–400)
RBC: 2.79 MIL/uL — ABNORMAL LOW (ref 3.87–5.11)
RDW: 12.3 % (ref 11.5–15.5)
WBC: 8.3 10*3/uL (ref 4.0–10.5)
nRBC: 0 % (ref 0.0–0.2)

## 2020-04-11 MED ORDER — ACETAMINOPHEN 325 MG PO TABS
650.0000 mg | ORAL_TABLET | Freq: Four times a day (QID) | ORAL | Status: DC
  Administered 2020-04-11 – 2020-04-13 (×9): 650 mg via ORAL
  Filled 2020-04-11 (×9): qty 2

## 2020-04-11 MED ORDER — MAGNESIUM CITRATE PO SOLN
1.0000 | Freq: Once | ORAL | Status: AC
  Administered 2020-04-11: 1 via ORAL
  Filled 2020-04-11: qty 296

## 2020-04-11 MED ORDER — KETOROLAC TROMETHAMINE 15 MG/ML IJ SOLN
15.0000 mg | Freq: Four times a day (QID) | INTRAMUSCULAR | Status: AC
  Administered 2020-04-11 – 2020-04-12 (×5): 15 mg via INTRAVENOUS
  Filled 2020-04-11 (×5): qty 1

## 2020-04-11 MED ORDER — OXYCODONE HCL 5 MG PO TABS
5.0000 mg | ORAL_TABLET | ORAL | Status: DC | PRN
  Administered 2020-04-11: 10 mg via ORAL
  Administered 2020-04-12: 5 mg via ORAL
  Filled 2020-04-11: qty 1
  Filled 2020-04-11 (×2): qty 2

## 2020-04-11 NOTE — Progress Notes (Signed)
OT Cancellation Note  Patient Details Name: Kendra Allison MRN: [id number] DOB: June 13, 1955   Cancelled Treatment:    Reason Eval/Treat Not Completed: Other (comment) (currently working with PT). Plan to reattempt.  Tyrone Schimke, OT Acute Rehabilitation Services Pager: [phone] Office: [phone]  04/11/2020, 1:50 PM

## 2020-04-11 NOTE — Plan of Care (Signed)
Patient ambulating in room. Patient unable to have bowel movement at this time. Fluids encouraged.  Problem: Health Behavior/Discharge Planning: Goal: Ability to manage health-related needs will improve Outcome: Progressing   Problem: Clinical Measurements: Goal: Ability to maintain clinical measurements within normal limits will improve Outcome: Progressing Goal: Will remain free from infection Outcome: Progressing Goal: Diagnostic test results will improve Outcome: Progressing Goal: Respiratory complications will improve Outcome: Progressing Goal: Cardiovascular complication will be avoided Outcome: Progressing   Problem: Pain Managment: Goal: General experience of comfort will improve Outcome: Progressing   Problem: Safety: Goal: Ability to remain free from injury will improve Outcome: Progressing   Problem: Skin Integrity: Goal: Risk for impaired skin integrity will decrease Outcome: Progressing   Problem: Education: Goal: Required Educational Video(s) Outcome: Progressing   Problem: Clinical Measurements: Goal: Ability to maintain clinical measurements within normal limits will improve Outcome: Progressing Goal: Postoperative complications will be avoided or minimized Outcome: Progressing   Problem: Skin Integrity: Goal: Demonstration of wound healing without infection will improve Outcome: Progressing

## 2020-04-11 NOTE — Discharge Instructions (Signed)
Kendra Hamming, MD Kendra Pace PA-C [facility] [place] [phone] [name] (fax)                                  POST-OPERATIVE INSTRUCTIONS     WEIGHT BEARING STATUS: Non-weightbearing left leg  RANGE OF MOTION/ACTIVITY:  Ok for knee motion as tolerated. Work on moving toes as much as you can tolerated  WOUND CARE ? Please keep splint clean dry and intact until follow-up. If your splint gets wet for any reason please contact the office immediately.  Do not stick anything down your splint such as pencils, momey, hangers to try and scratch yourself.  If you feel itchy take Benadryl as prescribed on the bottle for itching ? You may shower on Post-Op Day #2.  ? You must keep splint dry during this process and may find that a plastic bag taped around the extremity or alternatively a towel based bath may be a better option.   ? If you get your splint wet or if it is damaged please contact our clinic.  EXERCISES ? Due to your splint being in place you will not be able to bear weight through your extremity.   ? DO NOT PUT ANY WEIGHT ON YOUR OPERATIVE LEG ? Please use crutches or a walker to avoid weight bearing.   DVT/PE prophylaxis: Lovenox  DIET: As you were eating previously.  Can use over the counter stool softeners and bowel preparations, such as Miralax, to help with bowel movements.  Narcotics can be constipating.  Be sure to drink plenty of fluids  REGIONAL ANESTHESIA (NERVE BLOCKS)  The anesthesia team may have performed a nerve block for you if safe in the setting of your care.  This is a great tool used to minimize pain.  Typically the block may start wearing off overnight but the long acting medicine may last for 3-4 days.  The nerve block wearing off can be a challenging period but please utilize your as needed pain medications to try and manage this period.    POST-OP MEDICATIONS- Multimodal approach to pain control  In  general your pain will be controlled with a combination of substances.  Prescriptions unless otherwise discussed are electronically sent to your pharmacy.  This is a carefully made plan we use to minimize narcotic use.     - Meloxicam OR Celebrex - Anti-inflammatory medication taken on a scheduled basis  - Acetaminophen - Non-narcotic pain medicine taken on a scheduled basis   - Oxycodone - This is a strong narcotic, to be used only on an "as needed" basis for pain.  -  Aspirin 81mg  - This medicine is used to minimize the risk of blood clots after surgery.             -          Zofran - take as needed for nausea   FOLLOW-UP ? If you develop a Fever (>101.5), Redness or Drainage from the surgical incision site, please call our office to arrange for an evaluation. ? Please call the office to schedule a follow-up appointment for your incision check if you do not already have one, 7-10 days post-operatively.   VISIT OUR WEBSITE FOR ADDITIONAL INFORMATION: [email or web address]   HELPFUL INFORMATION  ? If you had a block, it will wear off between 8-24 hrs postop typically.  This is period when your  pain may go from nearly zero to the pain you would have had postop without the block.  This is an abrupt transition but nothing dangerous is happening.  You may take an extra dose of narcotic when this happens.  ? You should wean off your narcotic medicines as soon as you are able.  Most patients will be off or using minimal narcotics before their first postop appointment.   ? We suggest you use the pain medication the first night prior to going to bed, in order to ease any pain when the anesthesia wears off. You should avoid taking pain medications on an empty stomach as it will make you nauseous.  ? Do not drink alcoholic beverages or take illicit drugs when taking pain medications.  ? In most states it is against the law to drive while you are in a splint or sling.  And certainly against the law to  drive while taking narcotics.  ? You may return to work/school in the next couple of days when you feel up to it.   ? Pain medication may make you constipated.  Below are a few solutions to try in this order: - Decrease the amount of pain medication if you arent having pain. - Drink lots of decaffeinated fluids. - Drink prune juice and/or each dried prunes  o If the first 3 dont work start with additional solutions - Take Colace - an over-the-counter stool softener - Take Senokot - an over-the-counter laxative - Take Miralax - a stronger over-the-counter laxative

## 2020-04-11 NOTE — Progress Notes (Signed)
Physical Therapy Treatment Patient Details Name: Kendra Allison MRN: [id number] DOB: 1956-04-25 Today's Date: 04/11/2020    History of Present Illness Patient is a 64 y/o female s/p fall through ceiling and sustained a L open pilon fx. Patient underwent L tibia external fixation and I&D on 11/14. Patient's PMH includes asthma, GERD, hypercholesteremia, and HTN.    PT Comments    Patient progressing towards physical therapy goals. Patient ambulated 45' with RW and min guard for safety. Patient performed bed mobility x2 with supervision and instructed patient on use of sheet/blanket to assist L LE on/off bed with patient preferring to use B UEs. Patient continues to be limited by decreased activity tolerance, impaired balance, generalized weakness, and impaired functional mobility. Anticipate no PT follow up at this time, however recommend OPPT following WB restriction change.     Follow Up Recommendations  No PT follow up (Recommend OPPT following WB restriction change)     Equipment Recommendations  Rolling Tylee Newby with 5" wheels;3in1 (PT);Wheelchair (measurements PT);Wheelchair cushion (measurements PT)    Recommendations for Other Services       Precautions / Restrictions Precautions Precautions: Fall Required Braces or Orthoses: Splint/Cast Splint/Cast: Ace wrap on L lower leg Restrictions Weight Bearing Restrictions: Yes LLE Weight Bearing: Non weight bearing    Mobility  Bed Mobility Overal bed mobility: Needs Assistance Bed Mobility: Supine to Sit;Sit to Supine     Supine to sit: Supervision;HOB elevated (use of trapeze) Sit to supine: Supervision;HOB elevated   General bed mobility comments: pt used UEs to bring L LE onto bed due to heaviness of splint and weakness. Educated and instructed patient on use of a sheet/blanket to assist L LE onto bed. Patient performed bed mobility x2 to practice bringing L LE onto bed with hands and sheet  Transfers Overall transfer  level: Needs assistance Equipment used: Rolling Letisia Schwalb (2 wheeled) Transfers: Sit to/from Stand Sit to Stand: Supervision         General transfer comment: Pt able to adhere to NWB status  Ambulation/Gait Ambulation/Gait assistance: Min guard Gait Distance (Feet): 35 Feet Assistive device: Rolling Itzabella Sorrels (2 wheeled) Gait Pattern/deviations: Step-to pattern     General Gait Details: min guard for safety   Stairs             Wheelchair Mobility    Modified Rankin (Stroke Patients Only)       Balance Overall balance assessment: Needs assistance Sitting-balance support: No upper extremity supported;Feet supported Sitting balance-[name]: Good     Standing balance support: Bilateral upper extremity supported;During functional activity Standing balance-[name]: Fair                              Cognition Arousal/Alertness: Awake/alert Behavior During Therapy: WFL for tasks assessed/performed Overall Cognitive Status: Within Functional Limits for tasks assessed                                        Exercises      General Comments        Pertinent Vitals/Pain Pain Assessment: Faces Faces Pain Scale: Hurts little more Pain Location: L lower leg/ankle Pain Descriptors / Indicators: Grimacing;Guarding;Operative site guarding Pain Intervention(s): Monitored during session;Repositioned    Home Living  Prior Function            PT Goals (current goals can now be found in the care plan section) Acute Rehab PT Goals Patient Stated Goal: to go home PT Goal Formulation: With patient Time For Goal Achievement: 04/22/20 Potential to Achieve Goals: Good Progress towards PT goals: Progressing toward goals    Frequency    Min 5X/week      PT Plan Current plan remains appropriate    Co-evaluation              AM-PAC PT "6 Clicks" Mobility   Outcome Measure  Help needed  turning from your back to your side while in a flat bed without using bedrails?: None Help needed moving from lying on your back to sitting on the side of a flat bed without using bedrails?: None Help needed moving to and from a bed to a chair (including a wheelchair)?: A Little Help needed standing up from a chair using your arms (e.g., wheelchair or bedside chair)?: None Help needed to walk in hospital room?: A Little Help needed climbing 3-5 steps with a railing? : A Little 6 Click Score: 21    End of Session Equipment Utilized During Treatment: Gait belt Activity Tolerance: Patient tolerated treatment well Patient left: in bed;with call bell/phone within reach Nurse Communication: Mobility status PT Visit Diagnosis: Unsteadiness on feet (R26.81);Other abnormalities of gait and mobility (R26.89);Muscle weakness (generalized) (M62.81)     Time: 3875-6433 PT Time Calculation (min) (ACUTE ONLY): 21 min  Charges:  $Therapeutic Activity: 8-22 mins                     [name], PT, DPT Acute Rehabilitation Services Pager [phone] Office [phone]    [name] 04/11/2020, 2:24 PM

## 2020-04-11 NOTE — Progress Notes (Addendum)
Orthopaedic Trauma Progress Note  SUBJECTIVE: Patient seen in conjunction with Dr. Doreatha Martin. Having a lot of pain in left leg, medications helping some but not adequately controlling her pain. Also notes some abdominal discomfort this morning, has not had a BM since admission.  We will schedule Tylenol, add Toradol, and adjust other pain medications. Will add mag citrate to aid in patient having a BM to relieve some of her abdominal discomfort.    OBJECTIVE:  Vitals:   04/11/20 0107 04/11/20 0456  BP: 132/88 (!) 148/81  Pulse: 81 83  Resp: 17 17  Temp: 99.5 F (37.5 C) 99 F (37.2 C)  SpO2: 100% 100%    General: Sitting up in bedside chair, NAD Respiratory: No increased work of breathing.  Left Lower Extremity: Splint in place is clean, dry, intact.  Able to wiggle toes. Endorses sensation to light touch over toes. Foot warm and well perfused  IMAGING: Stable post op imaging  LABS:  Results for orders placed or performed during the hospital encounter of 04/07/20 (from the past 24 hour(s))  CBC     Status: Abnormal   Collection Time: 04/11/20  3:04 AM  Result Value Ref Range   WBC 8.3 4.0 - 10.5 K/uL   RBC 2.79 (L) 3.87 - 5.11 MIL/uL   Hemoglobin 8.4 (L) 12.0 - 15.0 g/dL   HCT 26.1 (L) 36 - 46 %   MCV 93.5 80.0 - 100.0 fL   MCH 30.1 26.0 - 34.0 pg   MCHC 32.2 30.0 - 36.0 g/dL   RDW 12.3 11.5 - 15.5 %   Platelets 279 150 - 400 K/uL   nRBC 0.0 0.0 - 0.2 %    ASSESSMENT: Kendra Allison is a 64 y.o. female s/p fall through ceiling, 1 Day Post-Op  Injuries: left open pilon fracture s/p removal of ex-fix and IMN  CV/Blood loss: Acute blood loss anemia, Hgb 8.4 this morning. Hemodynamically stable  PLAN: Weightbearing: NWB LLE Incisional and dressing care:  Leave splint in place Showering: Ok to begin showering with assistance. Keep splint covered Orthopedic device(s): Splint LLE  Pain management:  1. Tylenol 650 mg q 6 hours  2. Robaxin 500 mg q 6 hours PRN 3. Oxycodone  5-10 mg q 4 hours PRN  4. Neurontin 100 mg TID 5. Morphine 0.5-1 mg q 2 hours PRN 6. Toradol 15 mg q 6 hours x 5 doses VTE prophylaxis: Lovenox, SCDs ID: Ancef 2 g postop  Foley/Lines:  No foley, KVO IVFs Impediments to Fracture Healing: Vit D level 47, no need for supplementation at this time  Dispo: Therapies as tolerated.  PT/OT recommending outpatient therapies once weightbearing status advanced.  Will place order for DME.  Focus on pain control today and plan for discharge and likely the next 24 to 48 hours pending patient's progress.  Follow - up plan: 2 weeks  Contact information:  Katha Hamming MD, [name] PA-C. After hours and holidays please check [email or web address] for group call information for [facility]   Lezette Kitts A. Ricci Barker, PA-C [phone] (office) [email or web address]

## 2020-04-12 ENCOUNTER — Other Ambulatory Visit (HOSPITAL_COMMUNITY): Payer: Self-pay | Admitting: Student

## 2020-04-12 DIAGNOSIS — S82872B Displaced pilon fracture of left tibia, initial encounter for open fracture type I or II: Secondary | ICD-10-CM | POA: Diagnosis not present

## 2020-04-12 DIAGNOSIS — S82452A Displaced comminuted fracture of shaft of left fibula, initial encounter for closed fracture: Secondary | ICD-10-CM | POA: Diagnosis not present

## 2020-04-12 DIAGNOSIS — S82202C Unspecified fracture of shaft of left tibia, initial encounter for open fracture type IIIA, IIIB, or IIIC: Secondary | ICD-10-CM | POA: Diagnosis not present

## 2020-04-12 DIAGNOSIS — S82402C Unspecified fracture of shaft of left fibula, initial encounter for open fracture type IIIA, IIIB, or IIIC: Secondary | ICD-10-CM | POA: Diagnosis not present

## 2020-04-12 LAB — CBC
HCT: 24 % — ABNORMAL LOW (ref 36.0–46.0)
Hemoglobin: 7.6 g/dL — ABNORMAL LOW (ref 12.0–15.0)
MCH: 30.3 pg (ref 26.0–34.0)
MCHC: 31.7 g/dL (ref 30.0–36.0)
MCV: 95.6 fL (ref 80.0–100.0)
Platelets: 278 10*3/uL (ref 150–400)
RBC: 2.51 MIL/uL — ABNORMAL LOW (ref 3.87–5.11)
RDW: 12.6 % (ref 11.5–15.5)
WBC: 6.8 10*3/uL (ref 4.0–10.5)
nRBC: 0 % (ref 0.0–0.2)

## 2020-04-12 MED ORDER — BISACODYL 10 MG RE SUPP
10.0000 mg | Freq: Every day | RECTAL | Status: DC | PRN
  Administered 2020-04-12: 10 mg via RECTAL
  Filled 2020-04-12 (×2): qty 1

## 2020-04-12 MED ORDER — METHOCARBAMOL 500 MG PO TABS: 500.0000 mg | ORAL_TABLET | Freq: Four times a day (QID) | ORAL | 0 refills | Status: DC | PRN

## 2020-04-12 MED ORDER — OXYCODONE HCL 5 MG PO TABS: 5.0000 mg | ORAL_TABLET | ORAL | 0 refills | Status: DC | PRN

## 2020-04-12 MED ORDER — DOCUSATE SODIUM 100 MG PO CAPS
100.0000 mg | ORAL_CAPSULE | Freq: Two times a day (BID) | ORAL | 0 refills | Status: DC
Start: 2020-04-12 — End: 2020-08-21

## 2020-04-12 MED ORDER — GABAPENTIN 100 MG PO CAPS
100.0000 mg | ORAL_CAPSULE | Freq: Three times a day (TID) | ORAL | 0 refills | Status: DC
Start: 2020-04-12 — End: 2020-06-15

## 2020-04-12 MED ORDER — ENOXAPARIN SODIUM 40 MG/0.4ML ~~LOC~~ SOLN: 40.0000 mg | SUBCUTANEOUS | 0 refills | Status: DC

## 2020-04-12 MED FILL — ENOXAPARIN SODIUM 40 MG/0.4: 40 | 28 days supply | Qty: 11 | Fill #0

## 2020-04-12 MED FILL — GABAPENTIN 100 MG CAPSULE: 100 | 7 days supply | Qty: 21 | Fill #0

## 2020-04-12 MED FILL — METHOCARBAMOL 500 MG TABS: 500 | 7 days supply | Qty: 28 | Fill #0

## 2020-04-12 MED FILL — DOCUSATE SODIUM 100 MG CAPS: 100 | 7 days supply | Qty: 14 | Fill #0

## 2020-04-12 MED FILL — oxyCODONE HCL 5 MG TABS: 5 | 7 days supply | Qty: 42 | Fill #0

## 2020-04-12 NOTE — Plan of Care (Signed)
  Problem: Health Behavior/Discharge Planning: Goal: Ability to manage health-related needs will improve Outcome: Progressing   Problem: Clinical Measurements: Goal: Ability to maintain clinical measurements within normal limits will improve Outcome: Progressing Goal: Will remain free from infection Outcome: Progressing Goal: Diagnostic test results will improve Outcome: Progressing Goal: Respiratory complications will improve Outcome: Progressing   

## 2020-04-12 NOTE — Care Management (Signed)
    Durable Medical Equipment  (From admission, onward)         Start     Ordered   04/12/20 1138  For home use only DME standard manual wheelchair with seat cushion  Once       Comments: Patient suffers from ORIF of left pilon fracture, IM nailing of left tibia fracture, Open reduction of IM nailing left fibula fracture , removal of left leg external fixation   which impairs their ability to perform daily activities like ambulating  in the home.  A cane will not resolve issue with performing activities of daily living. A wheelchair will allow patient to safely perform daily activities. Patient can safely propel the wheelchair in the home or has a caregiver who can provide assistance. Length of need lifetime . Accessories: elevating leg rests (ELRs), wheel locks, extensions and anti-tippers.   04/12/20 1138   04/11/20 1314  For home use only DME 3 n 1  Once        04/11/20 1313   04/11/20 1314  For home use only DME Walker rolling  Once       Question Answer Comment  Walker: With 5 Inch Wheels   Patient needs a walker to treat with the following condition Open displaced pilon fracture of left tibia      04/11/20 1313

## 2020-04-12 NOTE — Progress Notes (Signed)
Orthopaedic Trauma Progress Note  SUBJECTIVE: Pain in leg improving after medication change yesterday.  Continues to have significant abdominal discomfort this morning, has not had a BM since admission.  Given mag citrate yesterday with no success.  Will add Dulcolax suppository today.  Denies any nausea, vomiting, shortness of breath, chest pain  OBJECTIVE:  Vitals:   04/12/20 0159 04/12/20 0441  BP: (!) 155/96 (!) 154/95  Pulse: 85 83  Resp: 17 17  Temp: 98.6 F (37 C) 98.4 F (36.9 C)  SpO2: 99% 99%    General: Sitting up in bed eating breakfast, NAD Respiratory: No increased work of breathing.  Abdomen: Tenderness and fullness throughout the abdomen Left Lower Extremity: Splint in place is clean, dry, intact.  Dressing over knee changed, incision CDI.  Able to wiggle toes. Endorses sensation to light touch over toes. Foot warm and well perfused  IMAGING: Stable post op imaging  LABS:  Results for orders placed or performed during the hospital encounter of 04/07/20 (from the past 24 hour(s))  CBC     Status: Abnormal   Collection Time: 04/12/20 12:58 AM  Result Value Ref Range   WBC 6.8 4.0 - 10.5 K/uL   RBC 2.51 (L) 3.87 - 5.11 MIL/uL   Hemoglobin 7.6 (L) 12.0 - 15.0 g/dL   HCT 24.0 (L) 36 - 46 %   MCV 95.6 80.0 - 100.0 fL   MCH 30.3 26.0 - 34.0 pg   MCHC 31.7 30.0 - 36.0 g/dL   RDW 12.6 11.5 - 15.5 %   Platelets 278 150 - 400 K/uL   nRBC 0.0 0.0 - 0.2 %    ASSESSMENT: Kendra Allison is a 64 y.o. female s/p fall through ceiling, 2 Days Post-Op  Injuries: left open pilon fracture s/p removal of ex-fix and IMN  CV/Blood loss: Acute blood loss anemia, Hgb 7.6 this morning. Hemodynamically stable.  Continue to monitor CBC  PLAN: Weightbearing: NWB LLE Incisional and dressing care:  Leave splint in place Showering: Ok to begin showering with assistance. Keep splint covered Orthopedic device(s): Splint LLE  Pain management:  1. Tylenol 650 mg q 6 hours  2. Robaxin  500 mg q 6 hours PRN 3. Oxycodone 5-10 mg q 4 hours PRN  4. Neurontin 100 mg TID 5. Morphine 0.5-1 mg q 2 hours PRN 6. Toradol 15 mg q 6 hours x 5 doses VTE prophylaxis: Lovenox, SCDs ID: Ancef 2 g postop completed Foley/Lines:  No foley, KVO IVFs Impediments to Fracture Healing: Vit D level 47, no need for supplementation at this time  Dispo: Therapies as tolerated.  PT/OT recommending outpatient therapies once weightbearing status advanced.  Recheck CBC tomorrow morning.  Plan for discharge and likely the next 24 to 48 hours pending patient's progress.  Follow - up plan: 2 weeks  Contact information:  Katha Hamming MD, [name] PA-C. After hours and holidays please check [email or web address] for group call information for [facility]   Lamisha Roussell A. Ricci Barker, PA-C [phone] (office) [email or web address]

## 2020-04-12 NOTE — Progress Notes (Signed)
Physical Therapy Treatment Patient Details Name: Kendra Allison MRN: [id number] DOB: 1955/12/30 Today's Date: 04/12/2020    History of Present Illness Patient is a 64 y/o female s/p fall through ceiling and sustained a L open pilon fx. Patient underwent L tibia external fixation and I&D on 11/14. Patient's PMH includes asthma, GERD, hypercholesteremia, and HTN.    PT Comments    Patient progressing towards physical therapy goals. Patient had questions about equipment recommendations primarily about w/c. Educated patient about use of w/c for longer distances or when fatigue level is high, patient verbalized understanding and appreciation of advice. Patient overall requires supervision for all mobility. Patient ambulated 75' with RW and supervision, request to return to bed due to increased fatigue this session. Discussed energy conservation techniques for when she returns home. Anticipate no PT follow up following discharge, however recommend OPPT following WB restriction change.     Follow Up Recommendations  No PT follow up (recommend OPPT following WB restriction change)     Equipment Recommendations  Rolling Jahel Wavra with 5" wheels;3in1 (PT);Wheelchair (measurements PT);Wheelchair cushion (measurements PT)    Recommendations for Other Services       Precautions / Restrictions Precautions Precautions: Fall Required Braces or Orthoses: Splint/Cast Splint/Cast: Ace wrap on L lower leg Restrictions Weight Bearing Restrictions: Yes LLE Weight Bearing: Non weight bearing    Mobility  Bed Mobility Overal bed mobility: Needs Assistance Bed Mobility: Supine to Sit;Sit to Supine     Supine to sit: Supervision;HOB elevated Sit to supine: Supervision;HOB elevated   General bed mobility comments: supervision for safety  Transfers Overall transfer level: Needs assistance Equipment used: Rolling Chasmine Lender (2 wheeled) Transfers: Sit to/from Stand Sit to Stand: Supervision          General transfer comment: Supervision for safety  Ambulation/Gait Ambulation/Gait assistance: Supervision Gait Distance (Feet): 12 Feet Assistive device: Rolling Taraya Steward (2 wheeled) Gait Pattern/deviations: Step-to pattern         Stairs             Wheelchair Mobility    Modified Rankin (Stroke Patients Only)       Balance Overall balance assessment: Needs assistance Sitting-balance support: No upper extremity supported;Feet supported Sitting balance-[name]: Good     Standing balance support: No upper extremity supported Standing balance-[name]: Fair                              Cognition Arousal/Alertness: Awake/alert Behavior During Therapy: WFL for tasks assessed/performed Overall Cognitive Status: Within Functional Limits for tasks assessed                                        Exercises      General Comments        Pertinent Vitals/Pain Pain Assessment: Faces Faces Pain Scale: Hurts a little bit Pain Location: L lower leg/ankle Pain Descriptors / Indicators: Grimacing;Guarding;Operative site guarding Pain Intervention(s): Monitored during session    Home Living                      Prior Function            PT Goals (current goals can now be found in the care plan section) Acute Rehab PT Goals Patient Stated Goal: to go home PT Goal Formulation: With patient Time For Goal Achievement: 04/22/20 Potential to  Achieve Goals: Good Progress towards PT goals: Progressing toward goals    Frequency    Min 5X/week      PT Plan Current plan remains appropriate    Co-evaluation              AM-PAC PT "6 Clicks" Mobility   Outcome Measure  Help needed turning from your back to your side while in a flat bed without using bedrails?: None Help needed moving from lying on your back to sitting on the side of a flat bed without using bedrails?: None Help needed moving to and from a bed  to a chair (including a wheelchair)?: None Help needed standing up from a chair using your arms (e.g., wheelchair or bedside chair)?: None Help needed to walk in hospital room?: None Help needed climbing 3-5 steps with a railing? : A Lot 6 Click Score: 22    End of Session   Activity Tolerance: Patient tolerated treatment well Patient left: in bed;with call bell/phone within reach Nurse Communication: Mobility status PT Visit Diagnosis: Unsteadiness on feet (R26.81);Other abnormalities of gait and mobility (R26.89);Muscle weakness (generalized) (M62.81)     Time: 0802-2336 PT Time Calculation (min) (ACUTE ONLY): 29 min  Charges:  $Therapeutic Activity: 23-37 mins                     [name], PT, DPT Acute Rehabilitation Services Pager [phone] Office [phone]    Chesley Noon K Allred 04/12/2020, 2:00 PM

## 2020-04-12 NOTE — TOC Initial Note (Signed)
Transition of Care [facility]) - Initial/Assessment Note    Patient Details  Name: Kendra Allison MRN: [id number] Date of Birth: 1956-02-27  Transition of Care [facility]) CM/SW Contact:    Marilu Favre, RN Phone Number: 04/12/2020, 11:45 AM  Clinical Narrative:                  Confirmed face sheet information with patient at bedside.   Patient is from home alone, however she has family who can help at discharge.   Ordered wheel chair , walker and 3 in1 through [employer] , will deliver to hospital room today .  Expected Discharge Plan: Home/Self Care Barriers to Discharge: Continued Medical Work up   Patient Goals and CMS Choice Patient states their goals for this hospitalization and ongoing recovery are:: to return to home CMS Medicare.gov Compare Post Acute Care list provided to:: Patient Choice offered to / list presented to : Patient  Expected Discharge Plan and Services Expected Discharge Plan: Home/Self Care     Post Acute Care Choice: Durable Medical Equipment Living arrangements for the past 2 months: Single Family Home                 DME Arranged: 3-N-1, Walker rolling, Wheelchair manual DME Agency: [employer] Date DME Agency Contacted: 04/12/20 Time DME Agency Contacted: [phone]              Prior Living Arrangements/Services Living arrangements for the past 2 months: Single Family Home Lives with:: Self Patient language and need for interpreter reviewed:: Yes Do you feel safe going back to the place where you live?: Yes      Need for Family Participation in Patient Care: Yes (Comment) Care giver support system in place?: Yes (comment)   Criminal Activity/Legal Involvement Pertinent to Current Situation/Hospitalization: No - Comment as needed  Activities of Daily Living Home Assistive Devices/Equipment: Wound Vac ADL Screening (condition at time of admission) Patient's cognitive ability adequate to safely complete daily activities?: Yes Is the  patient deaf or have difficulty hearing?: No Does the patient have difficulty seeing, even when wearing glasses/contacts?: No Does the patient have difficulty concentrating, remembering, or making decisions?: No Patient able to express need for assistance with ADLs?: No Does the patient have difficulty dressing or bathing?: No Independently performs ADLs?: Yes (appropriate for developmental age) Does the patient have difficulty walking or climbing stairs?: Yes Weakness of Legs: Left Weakness of Arms/Hands: None  Permission Sought/Granted   Permission granted to share information with : No              Emotional Assessment Appearance:: Appears stated age Attitude/Demeanor/Rapport: Engaged Affect (typically observed): Pleasant Orientation: : Oriented to Self, Oriented to Place, Oriented to  Time, Oriented to Situation Alcohol / Substance Use: Not Applicable Psych Involvement: No (comment)  Admission diagnosis:  Trauma [T14.90XA] Accidental fall, initial encounter [M38.XXXA] Open displaced pilon fracture of left tibia [S82.872B] Type I or II open displaced comminuted fracture of shaft of left tibia, initial encounter [S82.252B] Patient Active Problem List   Diagnosis Date Noted  . Fracture of tibia and fibula, shaft, left, open type III, initial encounter 04/10/2020  . Displaced comminuted fracture of shaft of left fibula, initial encounter for closed fracture 04/10/2020  . Fall from height of greater than 3 feet 04/10/2020  . Open displaced pilon fracture of left tibia 04/07/2020   PCP:  Olin Hauser, DO Pharmacy:   [facility] [place], [place]  AT [facility] OF [address] [place] [address] Phone: [phone] Fax: [phone]     Social Determinants of Health (SDOH) Interventions    Readmission Risk Interventions No flowsheet data found.

## 2020-04-12 NOTE — Progress Notes (Signed)
Occupational Therapy Treatment Patient Details Name: Kendra Allison MRN: [id number] DOB: [birth year] Today's Date: 04/12/2020    History of present illness Patient is a 64 y/o female s/p fall through ceiling and sustained a L open pilon fx. Patient underwent L tibia external fixation and I&D on 11/14. Patient's PMH includes asthma, GERD, hypercholesteremia, and HTN.   OT comments  Pt progressing towards established OT goals. Providing pt with education on compensatory techniques for LB dressing; pt donning/doffing underwear with Supervision. Pt performing toileting and functional mobility with Supervision-Min Guard A. Providing pt with education on safe shower transfer and use of 3N1 as shower seat; pt verbalized understanding and demonstrated hoping backwards with RW for simulated in/out of shower. Continue to recommend dc to home with HHOT and will continue to follow acutely as admitted.    Follow Up Recommendations  Home health OT;Follow surgeons recommendation for DC plan and follow-up therapies;Supervision - Intermittent    Equipment Recommendations  3 in 1 bedside commode    Recommendations for Other Services      Precautions / Restrictions Precautions Precautions: Fall Required Braces or Orthoses: Splint/Cast Splint/Cast: Ace wrap on L lower leg Restrictions Weight Bearing Restrictions: Yes LLE Weight Bearing: Non weight bearing       Mobility Bed Mobility Overal bed mobility: Needs Assistance Bed Mobility: Supine to Sit     Supine to sit: Supervision;HOB elevated (use of trapeze)     General bed mobility comments: supervision for safety  Transfers Overall transfer level: Needs assistance Equipment used: Rolling walker (2 wheeled) Transfers: Sit to/from Stand Sit to Stand: Supervision         General transfer comment: Supervision for safety    Balance Overall balance assessment: Needs assistance Sitting-balance support: No upper extremity supported;Feet  supported Sitting balance-[name]: Good     Standing balance support: Bilateral upper extremity supported;During functional activity Standing balance-[name]: Fair                             ADL either performed or assessed with clinical judgement   ADL Overall ADL's : Needs assistance/impaired                     Lower Body Dressing: Cueing for sequencing;Sit to/from stand;Supervision/safety;Set up Lower Body Dressing Details (indicate cue type and reason): Educating pt on compensatory techniques for LB dressing. Pt donning and doffing underwear with close supervision Toilet Transfer: RW;Grab bars;Supervision/safety;Ambulation;Regular Glass blower/designer Details (indicate cue type and reason): Supervision for safety       Tub/Shower Transfer Details (indicate cue type and reason): Educating pt on safe shower transfer. Discussing use of 3n1 as shower seat to provide increased support. Functional mobility during ADLs: Min guard;Cueing for sequencing;Rolling walker General ADL Comments: Pt performing LB dressing, toileting, and functional mobiltiy in hallway. Discusse safety in shower     Vision   Vision Assessment?: No apparent visual deficits   Perception     Praxis      Cognition Arousal/Alertness: Awake/alert Behavior During Therapy: WFL for tasks assessed/performed Overall Cognitive Status: Within Functional Limits for tasks assessed                                          Exercises     Shoulder Instructions       General Comments  Pertinent Vitals/ Pain       Pain Assessment: Faces Faces Pain Scale: Hurts little more Pain Location: L lower leg/ankle Pain Descriptors / Indicators: Grimacing;Guarding;Operative site guarding Pain Intervention(s): Monitored during session;Limited activity within patient's tolerance;Repositioned  Home Living                                          Prior  Functioning/Environment              Frequency  Min 2X/week        Progress Toward Goals  OT Goals(current goals can now be found in the care plan section)  Progress towards OT goals: Progressing toward goals  Acute Rehab OT Goals Patient Stated Goal: to go home OT Goal Formulation: With patient Time For Goal Achievement: 04/22/20 Potential to Achieve Goals: Good ADL Goals Pt Will Perform Lower Body Dressing: with modified independence;with adaptive equipment;sitting/lateral leans;sit to/from stand Pt Will Transfer to Toilet: with supervision;ambulating;bedside commode Pt Will Perform Toileting - Clothing Manipulation and hygiene: with modified independence;sitting/lateral leans;sit to/from stand Pt Will Perform Tub/Shower Transfer: with supervision;rolling walker;ambulating  Plan Discharge plan remains appropriate    Co-evaluation                 AM-PAC OT "6 Clicks" Daily Activity     Outcome Measure   Help from another person eating meals?: None Help from another person taking care of personal grooming?: A Little Help from another person toileting, which includes using toliet, bedpan, or urinal?: A Little Help from another person bathing (including washing, rinsing, drying)?: A Little Help from another person to put on and taking off regular upper body clothing?: A Little Help from another person to put on and taking off regular lower body clothing?: A Little 6 Click Score: 19    End of Session Equipment Utilized During Treatment: Gait belt;Rolling walker  OT Visit Diagnosis: Muscle weakness (generalized) (M62.81);Pain;Unsteadiness on feet (R26.81) Pain - Right/Left: Left   Activity Tolerance Patient tolerated treatment well   Patient Left with call bell/phone within reach (at toilet; notified RN)   Nurse Communication Mobility status;Weight bearing status (pt on toilet)        Time: 9629-5284 OT Time Calculation (min): 28 min  Charges: OT  General Charges $OT Visit: 1 Visit OT Treatments $Self Care/Home Management : 23-37 mins  [place], OTR/L Acute Rehab Pager: [phone] Office: [place] 04/12/2020, 1:00 PM

## 2020-04-13 ENCOUNTER — Encounter (HOSPITAL_COMMUNITY): Payer: Self-pay | Admitting: Student

## 2020-04-13 DIAGNOSIS — I10 Essential (primary) hypertension: Secondary | ICD-10-CM | POA: Diagnosis present

## 2020-04-13 DIAGNOSIS — J45909 Unspecified asthma, uncomplicated: Secondary | ICD-10-CM | POA: Diagnosis present

## 2020-04-13 DIAGNOSIS — K219 Gastro-esophageal reflux disease without esophagitis: Secondary | ICD-10-CM | POA: Diagnosis present

## 2020-04-13 DIAGNOSIS — S82452A Displaced comminuted fracture of shaft of left fibula, initial encounter for closed fracture: Secondary | ICD-10-CM | POA: Diagnosis not present

## 2020-04-13 DIAGNOSIS — S82202C Unspecified fracture of shaft of left tibia, initial encounter for open fracture type IIIA, IIIB, or IIIC: Secondary | ICD-10-CM | POA: Diagnosis not present

## 2020-04-13 DIAGNOSIS — S82402C Unspecified fracture of shaft of left fibula, initial encounter for open fracture type IIIA, IIIB, or IIIC: Secondary | ICD-10-CM | POA: Diagnosis not present

## 2020-04-13 DIAGNOSIS — S82872B Displaced pilon fracture of left tibia, initial encounter for open fracture type I or II: Secondary | ICD-10-CM | POA: Diagnosis not present

## 2020-04-13 LAB — CBC
HCT: 25.1 % — ABNORMAL LOW (ref 36.0–46.0)
Hemoglobin: 8 g/dL — ABNORMAL LOW (ref 12.0–15.0)
MCH: 30.3 pg (ref 26.0–34.0)
MCHC: 31.9 g/dL (ref 30.0–36.0)
MCV: 95.1 fL (ref 80.0–100.0)
Platelets: 316 10*3/uL (ref 150–400)
RBC: 2.64 MIL/uL — ABNORMAL LOW (ref 3.87–5.11)
RDW: 12.7 % (ref 11.5–15.5)
WBC: 8.3 10*3/uL (ref 4.0–10.5)
nRBC: 0 % (ref 0.0–0.2)

## 2020-04-13 MED ORDER — SIMETHICONE 80 MG PO CHEW
80.0000 mg | CHEWABLE_TABLET | Freq: Once | ORAL | Status: AC
  Administered 2020-04-13: 80 mg via ORAL
  Filled 2020-04-13: qty 1

## 2020-04-13 MED ORDER — FERROUS SULFATE 325 (65 FE) MG PO TBEC: 325.0000 mg | DELAYED_RELEASE_TABLET | Freq: Three times a day (TID) | ORAL | 2 refills | Status: DC

## 2020-04-13 NOTE — Discharge Summary (Signed)
Orthopaedic Trauma Service Progress Note  Patient ID: Kendra Allison MRN: [id number] DOB/AGE: [birth year] 63 y.o.  Subjective:  Doing very well Ready to go home   Some gas pains earlier this am Finally had a BM and feels much better  No other issues of note Pain improving    ROS As above  Objective:   VITALS:   Vitals:   04/12/20 1023 04/12/20 1405 04/12/20 2024 04/13/20 0411  BP: (!) 133/91 137/82 (!) 155/91 (!) 143/86  Pulse: 77 85 89 82  Resp: 20 18  18   Temp: 99.1 F (37.3 C) 99.2 F (37.3 C) 99.8 F (37.7 C) 99.2 F (37.3 C)  TempSrc: Oral Oral Oral Oral  SpO2: 99% 97% 98%   Weight:      Height:        Estimated body mass index is 28.89 kg/m as calculated from the following:   Height as of this encounter: 5\' 6"  (1.676 m).   Weight as of this encounter: 81.2 kg.   Intake/Output      11/19 0701 - 11/20 0700 11/20 0701 - 11/21 0700   P.O. 240 240   I.V. (mL/kg) 1145.8 (14.1)    IV Piggyback 0    Total Intake(mL/kg) 1385.8 (17.1) 240 (3)   Urine (mL/kg/hr)     Total Output     Net +1385.8 +240        Urine Occurrence 6 x    Stool Occurrence  1 x     LABS  Results for orders placed or performed during the hospital encounter of 04/07/20 (from the past 24 hour(s))  CBC     Status: Abnormal   Collection Time: 04/13/20  2:47 AM  Result Value Ref Range   WBC 8.3 4.0 - 10.5 K/uL   RBC 2.64 (L) 3.87 - 5.11 MIL/uL   Hemoglobin 8.0 (L) 12.0 - 15.0 g/dL   HCT 25.1 (L) 36 - 46 %   MCV 95.1 80.0 - 100.0 fL   MCH 30.3 26.0 - 34.0 pg   MCHC 31.9 30.0 - 36.0 g/dL   RDW 12.7 11.5 - 15.5 %   Platelets 316 150 - 400 K/uL   nRBC 0.0 0.0 - 0.2 %     PHYSICAL EXAM:   Gen: sitting up in bed, NAD, appears well, very pleasant  Lungs: Clear anterior fields, unlabored Cardiac: RRR Abd: + BS, NT Ext:       Left Lower Extremity   SLS c/d/i  Ext warm   Dressing anterior knee changed  by myself. Incision looks excellent. No signs of Infection   Ext warm   + DP pulse  DPN, SPN, TN sensation intact  EHL, FHL, lesser toe motor intact  No pain out of proportion with passive stretch   Swelling controlled and appropriate   Assessment/Plan: 3 Days Post-Op   Active Problems:   Open displaced pilon fracture of left tibia   Fracture of tibia and fibula, shaft, left, open type III, initial encounter   Displaced comminuted fracture of shaft of left fibula, initial encounter for closed fracture   Fall from height of greater than 3 feet   Asthma   GERD (gastroesophageal reflux disease)   Hypertension   Anti-infectives (From admission, onward)   Start     Dose/Rate Route Frequency Ordered Stop  04/10/20 1830  ceFAZolin (ANCEF) IVPB 2g/100 mL premix        2 g 200 mL/hr over 30 Minutes Intravenous Every 8 hours 04/10/20 1437 04/11/20 1200   04/10/20 1055  vancomycin (VANCOCIN) powder  Status:  Discontinued          As needed 04/10/20 1055 04/10/20 1256   04/10/20 0600  ceFAZolin (ANCEF) IVPB 2g/100 mL premix        2 g 200 mL/hr over 30 Minutes Intravenous To [facility]Stay [facility] 04/08/20 1148 04/10/20 1028   04/08/20 0800  cefTRIAXone (ROCEPHIN) 2 g in sodium chloride 0.9 % 100 mL IVPB  Status:  Discontinued        2 g 200 mL/hr over 30 Minutes Intravenous Every 24 hours 04/08/20 0702 04/08/20 0703   04/08/20 0745  cefTRIAXone (ROCEPHIN) 2 g in sodium chloride 0.9 % 100 mL IVPB  Status:  Discontinued        2 g 200 mL/hr over 30 Minutes Intravenous Every 24 hours 04/08/20 0648 04/08/20 0702   04/08/20 0702  cefTRIAXone (ROCEPHIN) 2 g in sodium chloride 0.9 % 100 mL IVPB        2 g 200 mL/hr over 30 Minutes Intravenous Every 24 hours 04/08/20 0703 04/09/20 0900   04/08/20 0600  ceFAZolin (ANCEF) IVPB 2g/100 mL premix  Status:  Discontinued        2 g 200 mL/hr over 30 Minutes Intravenous On call to O.R. 04/07/20 2236 04/08/20 0533   04/07/20 2044  tobramycin (NEBCIN)  powder  Status:  Discontinued          As needed 04/07/20 2045 04/07/20 2046   04/07/20 2015  vancomycin (VANCOCIN) powder  Status:  Discontinued          As needed 04/07/20 2046 04/07/20 2046   04/07/20 1829  ceFAZolin (ANCEF) 2-4 GM/100ML-% IVPB       Note to Pharmacy: Clearnce Sorrel   : cabinet override      04/07/20 1829 04/08/20 0644   04/07/20 1545  ceFAZolin (ANCEF) IVPB 2g/100 mL premix        2 g 200 mL/hr over 30 Minutes Intravenous STAT 04/07/20 1542 04/07/20 1650    .  POD/HD#: 47  64 y/o female s/p fall through ceiling with open L distal tibia and fibula fracture   -Open L distal tibia and fibula fracture s/p IMN tibia and fibula  NWB L leg  Splint x 2 weeks then begin ankle ROM   Unrestricted ROM L knee   Ice and elevate  Do not remove splint until office follow up   Can change dressing L knee as needed    Mobilize as much as possible while maintaining WB restrictions   - Pain management:  Multimodal  Continue with tylenol, robaxin, neurontin, oxy IR   - ABL anemia/Hemodynamics  Stable  Asymptomatic   Dc home with iron supplementation   - Medical issues   Home meds   Recommend stopping fosamax until further notice    - DVT/PE prophylaxis:  Lovenox x 28 days  - ID:   Completed abx for open fracture treatment   - Metabolic Bone Disease:  Fosamax PTA---> osteoporosis   Recommend holding fosamax for now  New DEXA in 4-8 weeks if she has not had one in last 2 years  Review duration of fosamax use. If she has been on it greater than 2 years recommend drug holiday   Meets criteria for fracture liaison consult---> consult placed   -  Activity:  NWB L leg   - FEN/GI prophylaxis/Foley/Lines:  Reg diet   -Ex-fix/Splint care:  Do not remove splint   Keep splint clean and dry   - Impediments to fracture healing:  Osteoporosis   Open fracture   Bisphosphonate use   - Dispo:  Dc home today   Follow up with OTS in 10-14 days     Jari Pigg,  PA-C [phone] (C) 04/13/2020, 11:35 AM  [facility] [place] [id number] [phone] Jenetta [phone] (F)    After 5pm and on the weekends please log on to Amion, go to orthopaedics and the look under the [facility] Call for the provider(s) on call. You can also call our office at [phone] and then follow the prompts to be connected to the call team.

## 2020-04-13 NOTE — Discharge Planning (Signed)
Patient discharged home in stable condition. Verbalizes understanding of all discharge instructions, including home medications and follow up appointments. 

## 2020-04-13 NOTE — Progress Notes (Signed)
Occupational Therapy Treatment Patient Details Name: Kendra Allison MRN: [id number] DOB: 06/24/1955 Today's Date: 04/13/2020    History of present illness Patient is a 64 y/o female s/p fall through ceiling and sustained a L open pilon fx. Patient underwent L tibia external fixation and I&D on 11/14. Patient's PMH includes asthma, GERD, hypercholesteremia, and HTN.   OT comments  Pt. Seen for skilled OT session.  Focus of session safe use and set up of DME for home use.  toileting task with S.  Pt. With good recall and return demo for safety during adl completion.    Follow Up Recommendations  Home health OT;Follow surgeon's recommendation for DC plan and follow-up therapies;Supervision - Intermittent    Equipment Recommendations  3 in 1 bedside commode    Recommendations for Other Services      Precautions / Restrictions Precautions Precautions: Fall Required Braces or Orthoses: Splint/Cast Splint/Cast: Ace wrap on L lower leg Restrictions Weight Bearing Restrictions: Yes LLE Weight Bearing: Non weight bearing       Mobility Bed Mobility Overal bed mobility: Needs Assistance Bed Mobility: Supine to Sit     Supine to sit: Supervision     General bed mobility comments: supervision for safety, cues for hand placement. has tendancy to want to pull on rw to stand and also sit without reaching back  Transfers Overall transfer level: Needs assistance Equipment used: Rolling walker (2 wheeled) Transfers: Sit to/from [facility] Sit to Stand: Supervision Stand pivot transfers: Supervision       General transfer comment: Supervision for safety,cues for hand placement    Balance                                           ADL either performed or assessed with clinical judgement   ADL Overall ADL's : Needs assistance/impaired     Grooming: Supervision/safety;Standing Grooming Details (indicate cue type and reason): simulated during  b.room tasks. pt. had set up where she had previously completed seated using 3n1 at the b.room counter. reviewed option to stand or sit for grooming tasks                 Toilet Transfer: RW;Grab bars;Supervision/safety;Ambulation;BSC;Regular Glass blower/designer Details (indicate cue type and reason): Supervision for safety-3n1 over the commode, educated and provided demo on how to place over the commode and adj. the height as needed       Tub/Shower Transfer Details (indicate cue type and reason): reviewed used of 3n1 in tub for seat, but pt. reports she may sponge bathe initially Functional mobility during ADLs: Min guard;Cueing for sequencing;Rolling walker General ADL Comments: reviewed how to [place] and adj. height settings of rw, and 3n1 per pt. request.  states she lives alone and wants to ensure she understands every step.  performed toilet transfer. cues for reaching back before sitting down.     Vision       Perception     Praxis      Cognition Arousal/Alertness: Awake/alert Behavior During Therapy: WFL for tasks assessed/performed Overall Cognitive Status: Within Functional Limits for tasks assessed                                          Exercises     Shoulder Instructions  General Comments      Pertinent Vitals/ Pain       Pain Assessment: No/denies pain  Home Living                                          Prior Functioning/Environment              Frequency  Min 2X/week        Progress Toward Goals  OT Goals(current goals can now be found in the care plan section)  Progress towards OT goals: Progressing toward goals     Plan Discharge plan remains appropriate    Co-evaluation                 AM-PAC OT "6 Clicks" Daily Activity     Outcome Measure   Help from another person eating meals?: None Help from another person taking care of personal grooming?: A Little Help from  another person toileting, which includes using toliet, bedpan, or urinal?: A Little Help from another person bathing (including washing, rinsing, drying)?: A Little Help from another person to put on and taking off regular upper body clothing?: A Little Help from another person to put on and taking off regular lower body clothing?: A Little 6 Click Score: 19    End of Session Equipment Utilized During Treatment: Gait belt;Rolling walker  OT Visit Diagnosis: Muscle weakness (generalized) (M62.81);Pain;Unsteadiness on feet (R26.81) Pain - Right/Left: Left   Activity Tolerance Patient tolerated treatment well   Patient Left Other (comment) (left using the b.room with inst. to pull string for assistance once finished)   Nurse Communication Other (comment) (alerted rn pt. was in b.room and would pull string for assistance when finished.)        Time: 9163-8466 OT Time Calculation (min): 15 min  Charges: OT General Charges $OT Visit: 1 Visit OT Treatments $Self Care/Home Management : 8-22 mins  Sonia Baller, COTA/L Acute Rehabilitation [phone]   Janice Coffin 04/13/2020, 12:57 PM

## 2020-04-15 ENCOUNTER — Encounter: Payer: Self-pay | Admitting: Family Medicine

## 2020-04-17 ENCOUNTER — Encounter (HOSPITAL_COMMUNITY): Payer: Self-pay | Admitting: Student

## 2020-04-17 ENCOUNTER — Other Ambulatory Visit: Payer: Self-pay | Admitting: Family Medicine

## 2020-04-17 DIAGNOSIS — I517 Cardiomegaly: Secondary | ICD-10-CM

## 2020-04-17 DIAGNOSIS — I10 Essential (primary) hypertension: Secondary | ICD-10-CM

## 2020-04-17 NOTE — Telephone Encounter (Signed)
Pharmacy has questions about duplicate therapy medication- request PCP review- per office note May 2021- patient was started on amlodipine and the verapamil was continued- see no changes in most recent notes- sent for PCP review

## 2020-04-17 NOTE — Telephone Encounter (Signed)
Attempted to call patient and left her a voicemail. Tried calling her emergency contact sister, did not reach her.  Tried calling [facility] cannot reach them either.  I reviewed chart and ideally the patient would have been able to have better BP control on Amlodipine 10mg  daily instead of the Verapamil.  Would prefer to review medications with her to see if she is still taking both currently, and what her BP readings are since we added the Amlodipine.  If she is doing better, we may do trial off verapamil because of the duplication of therapy.  For now I will DC this refill request.  She may contact our office or follow-up sooner for BP management.  Nobie Putnam, DO [facility] 04/17/2020, 6:11 PM

## 2020-04-19 NOTE — Telephone Encounter (Signed)
Pt states she has been taking amlodipine and verapamil / Pt states she will continue with the amlodipine and would like a call from Dr. Parks Ranger pt has broken her leg and can not do an in office appt / please advise

## 2020-04-22 NOTE — Telephone Encounter (Signed)
Please contact patient to follow-up with her request with recent leg fracture.  She already had surgery earlier in November 2021, admitted on 11/14 and had surgery on 11/17.  It sounds like she is calling for a Hospital Follow-up Appointment, that she would like to do virtual. That is fine.  Her top priority should be follow-up with Orthopedic surgery.  We can get her scheduled whenever convenient within next 1 week for hospital follow-up  Nobie Putnam, [facility] 04/22/2020, 4:48 PM

## 2020-04-23 ENCOUNTER — Telehealth: Payer: Self-pay | Admitting: Family Medicine

## 2020-04-23 DIAGNOSIS — I10 Essential (primary) hypertension: Secondary | ICD-10-CM

## 2020-04-23 DIAGNOSIS — S82202F Unspecified fracture of shaft of left tibia, subsequent encounter for open fracture type IIIA, IIIB, or IIIC with routine healing: Secondary | ICD-10-CM | POA: Diagnosis not present

## 2020-04-23 DIAGNOSIS — S82452D Displaced comminuted fracture of shaft of left fibula, subsequent encounter for closed fracture with routine healing: Secondary | ICD-10-CM | POA: Diagnosis not present

## 2020-04-23 DIAGNOSIS — S82872F Displaced pilon fracture of left tibia, subsequent encounter for open fracture type IIIA, IIIB, or IIIC with routine healing: Secondary | ICD-10-CM | POA: Diagnosis not present

## 2020-04-23 MED ORDER — LOSARTAN POTASSIUM 50 MG PO TABS: 50.0000 mg | ORAL_TABLET | Freq: Every day | ORAL | 1 refills | Status: DC

## 2020-04-23 MED ORDER — AMLODIPINE BESYLATE 10 MG PO TABS: 10.0000 mg | ORAL_TABLET | Freq: Every day | ORAL | 1 refills | Status: DC

## 2020-04-23 NOTE — Telephone Encounter (Signed)
Patient called back due to recent pharmacy / decline issue with BP medication.  I spoke with patient on phone today to review and get updated.  She initiated care with [place] 02/2019, she was on Verapamil prior to starting care here.  I eventually added Losartan.  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  She had taken both Amlodipine + Verapamil for while and did fine.  We discontinued Verapamil and continued Amlodipine 10mg  daily. She was notified by [facility] team recently but still had confusion.  I spoke with her today and we confirmed that the plan going forward for BP is:  - Discontinue Verapamil - CONTINUE Amlodipine 10mg  daily - RESTART Losartan 50mg  daily  New rx of both meds sent locally to [facility] at her request , 90 day  No hospital F/U required for her fracture, she is seeing orthopedic, she cannot come in.  I advised her to schedule BP follow-up when ready.  Nobie Putnam, DO [facility] 04/23/2020, 5:17 PM

## 2020-04-24 NOTE — Telephone Encounter (Signed)
Patient advised as per Dr Raliegh Ip.

## 2020-04-25 ENCOUNTER — Ambulatory Visit: Payer: Federal, State, Local not specified - PPO | Admitting: Dermatology

## 2020-05-07 DIAGNOSIS — S82202F Unspecified fracture of shaft of left tibia, subsequent encounter for open fracture type IIIA, IIIB, or IIIC with routine healing: Secondary | ICD-10-CM | POA: Diagnosis not present

## 2020-05-07 DIAGNOSIS — S82872F Displaced pilon fracture of left tibia, subsequent encounter for open fracture type IIIA, IIIB, or IIIC with routine healing: Secondary | ICD-10-CM | POA: Diagnosis not present

## 2020-05-07 DIAGNOSIS — S82452D Displaced comminuted fracture of shaft of left fibula, subsequent encounter for closed fracture with routine healing: Secondary | ICD-10-CM | POA: Diagnosis not present

## 2020-05-12 DIAGNOSIS — S82872B Displaced pilon fracture of left tibia, initial encounter for open fracture type I or II: Secondary | ICD-10-CM | POA: Diagnosis not present

## 2020-05-12 DIAGNOSIS — S82452A Displaced comminuted fracture of shaft of left fibula, initial encounter for closed fracture: Secondary | ICD-10-CM | POA: Diagnosis not present

## 2020-05-12 DIAGNOSIS — S82402C Unspecified fracture of shaft of left fibula, initial encounter for open fracture type IIIA, IIIB, or IIIC: Secondary | ICD-10-CM | POA: Diagnosis not present

## 2020-05-12 DIAGNOSIS — S82202C Unspecified fracture of shaft of left tibia, initial encounter for open fracture type IIIA, IIIB, or IIIC: Secondary | ICD-10-CM | POA: Diagnosis not present

## 2020-05-20 ENCOUNTER — Other Ambulatory Visit: Payer: Self-pay | Admitting: Obstetrics and Gynecology

## 2020-05-20 DIAGNOSIS — Z8739 Personal history of other diseases of the musculoskeletal system and connective tissue: Secondary | ICD-10-CM

## 2020-05-28 DIAGNOSIS — S82202F Unspecified fracture of shaft of left tibia, subsequent encounter for open fracture type IIIA, IIIB, or IIIC with routine healing: Secondary | ICD-10-CM | POA: Diagnosis not present

## 2020-05-28 DIAGNOSIS — S82452D Displaced comminuted fracture of shaft of left fibula, subsequent encounter for closed fracture with routine healing: Secondary | ICD-10-CM | POA: Diagnosis not present

## 2020-05-28 DIAGNOSIS — S82872F Displaced pilon fracture of left tibia, subsequent encounter for open fracture type IIIA, IIIB, or IIIC with routine healing: Secondary | ICD-10-CM | POA: Diagnosis not present

## 2020-06-11 ENCOUNTER — Ambulatory Visit: Payer: Self-pay | Admitting: Student

## 2020-06-11 ENCOUNTER — Other Ambulatory Visit: Payer: Self-pay

## 2020-06-11 ENCOUNTER — Inpatient Hospital Stay (HOSPITAL_COMMUNITY)
Admission: EM | Admit: 2020-06-11 | Discharge: 2020-06-15 | DRG: 857 | Disposition: A | Discharge status: Home Health | Payer: Federal, State, Local not specified - PPO | Attending: Student | Admitting: Student

## 2020-06-11 ENCOUNTER — Encounter (HOSPITAL_COMMUNITY): Payer: Self-pay | Admitting: *Deleted

## 2020-06-11 DIAGNOSIS — E7800 Pure hypercholesterolemia, unspecified: Secondary | ICD-10-CM | POA: Diagnosis not present

## 2020-06-11 DIAGNOSIS — I10 Essential (primary) hypertension: Secondary | ICD-10-CM | POA: Diagnosis not present

## 2020-06-11 DIAGNOSIS — T8130XA Disruption of wound, unspecified, initial encounter: Secondary | ICD-10-CM | POA: Diagnosis not present

## 2020-06-11 DIAGNOSIS — B9689 Other specified bacterial agents as the cause of diseases classified elsewhere: Secondary | ICD-10-CM | POA: Diagnosis not present

## 2020-06-11 DIAGNOSIS — D62 Acute posthemorrhagic anemia: Secondary | ICD-10-CM | POA: Diagnosis not present

## 2020-06-11 DIAGNOSIS — T8149XA Infection following a procedure, other surgical site, initial encounter: Secondary | ICD-10-CM

## 2020-06-11 DIAGNOSIS — Z20822 Contact with and (suspected) exposure to covid-19: Secondary | ICD-10-CM | POA: Diagnosis present

## 2020-06-11 DIAGNOSIS — J45909 Unspecified asthma, uncomplicated: Secondary | ICD-10-CM | POA: Diagnosis present

## 2020-06-11 DIAGNOSIS — T8141XA Infection following a procedure, superficial incisional surgical site, initial encounter: Secondary | ICD-10-CM | POA: Diagnosis not present

## 2020-06-11 DIAGNOSIS — S8292XA Unspecified fracture of left lower leg, initial encounter for closed fracture: Secondary | ICD-10-CM | POA: Diagnosis present

## 2020-06-11 DIAGNOSIS — Z79899 Other long term (current) drug therapy: Secondary | ICD-10-CM

## 2020-06-11 DIAGNOSIS — K219 Gastro-esophageal reflux disease without esophagitis: Secondary | ICD-10-CM | POA: Diagnosis present

## 2020-06-11 DIAGNOSIS — E785 Hyperlipidemia, unspecified: Secondary | ICD-10-CM | POA: Diagnosis present

## 2020-06-11 DIAGNOSIS — B965 Pseudomonas (aeruginosa) (mallei) (pseudomallei) as the cause of diseases classified elsewhere: Secondary | ICD-10-CM | POA: Diagnosis not present

## 2020-06-11 DIAGNOSIS — Z7983 Long term (current) use of bisphosphonates: Secondary | ICD-10-CM | POA: Diagnosis not present

## 2020-06-11 DIAGNOSIS — W138XXD Fall from, out of or through other building or structure, subsequent encounter: Secondary | ICD-10-CM | POA: Diagnosis not present

## 2020-06-11 DIAGNOSIS — T148XXA Other injury of unspecified body region, initial encounter: Secondary | ICD-10-CM

## 2020-06-11 DIAGNOSIS — S82875D Nondisplaced pilon fracture of left tibia, subsequent encounter for closed fracture with routine healing: Secondary | ICD-10-CM | POA: Diagnosis not present

## 2020-06-11 LAB — COMPREHENSIVE METABOLIC PANEL
ALT: 16 U/L (ref 0–44)
AST: 17 U/L (ref 15–41)
Albumin: 4.8 g/dL (ref 3.5–5.0)
Alkaline Phosphatase: 64 U/L (ref 38–126)
Anion gap: 10 (ref 5–15)
BUN: 16 mg/dL (ref 8–23)
CO2: 25 mmol/L (ref 22–32)
Calcium: 10.8 mg/dL — ABNORMAL HIGH (ref 8.9–10.3)
Chloride: 103 mmol/L (ref 98–111)
Creatinine, Ser: 1.06 mg/dL — ABNORMAL HIGH (ref 0.44–1.00)
GFR, Estimated: 59 mL/min — ABNORMAL LOW (ref >60)
Glucose, Bld: 110 mg/dL — ABNORMAL HIGH (ref 70–99)
Potassium: 3.8 mmol/L (ref 3.5–5.1)
Sodium: 138 mmol/L (ref 135–145)
Total Bilirubin: 0.7 mg/dL (ref 0.3–1.2)
Total Protein: 7.7 g/dL (ref 6.5–8.1)

## 2020-06-11 LAB — LACTIC ACID, PLASMA
Lactic Acid, Venous: 1 mmol/L (ref 0.5–1.9)
Lactic Acid, Venous: 0.9 mmol/L (ref 0.5–1.9)

## 2020-06-11 LAB — CBC WITH DIFFERENTIAL/PLATELET
Abs Immature Granulocytes: 0.03 10*3/uL (ref 0.00–0.07)
Basophils Absolute: 0.1 10*3/uL (ref 0.0–0.1)
Basophils Relative: 1 %
Eosinophils Absolute: 0.1 10*3/uL (ref 0.0–0.5)
Eosinophils Relative: 1 %
HCT: 40 % (ref 36.0–46.0)
Hemoglobin: 12.6 g/dL (ref 12.0–15.0)
Immature Granulocytes: 1 %
Lymphocytes Relative: 24 %
Lymphs Abs: 1.6 10*3/uL (ref 0.7–4.0)
MCH: 29.5 pg (ref 26.0–34.0)
MCHC: 31.5 g/dL (ref 30.0–36.0)
MCV: 93.7 fL (ref 80.0–100.0)
Monocytes Absolute: 0.4 10*3/uL (ref 0.1–1.0)
Monocytes Relative: 6 %
Neutro Abs: 4.4 10*3/uL (ref 1.7–7.7)
Neutrophils Relative %: 67 %
Platelets: 336 10*3/uL (ref 150–400)
RBC: 4.27 MIL/uL (ref 3.87–5.11)
RDW: 12.6 % (ref 11.5–15.5)
WBC: 6.5 10*3/uL (ref 4.0–10.5)
nRBC: 0 % (ref 0.0–0.2)

## 2020-06-11 LAB — SEDIMENTATION RATE: Sed Rate: 19 mm/hr (ref 0–22)

## 2020-06-11 LAB — C-REACTIVE PROTEIN: CRP: <0.5 mg/dL (ref <1.0)

## 2020-06-11 MED ORDER — LORATADINE 10 MG PO TABS
10.0000 mg | ORAL_TABLET | Freq: Every day | ORAL | Status: DC
  Administered 2020-06-11: 10 mg via ORAL
  Filled 2020-06-11 (×4): qty 1

## 2020-06-11 MED ORDER — ONDANSETRON HCL 4 MG PO TABS: 4.0000 mg | ORAL_TABLET | Freq: Four times a day (QID) | ORAL | Status: DC | PRN

## 2020-06-11 MED ORDER — METHOCARBAMOL 500 MG PO TABS
500.0000 mg | ORAL_TABLET | Freq: Four times a day (QID) | ORAL | Status: DC | PRN
  Administered 2020-06-12 – 2020-06-14 (×3): 500 mg via ORAL
  Filled 2020-06-11 (×2): qty 1

## 2020-06-11 MED ORDER — KCL IN DEXTROSE-NACL 20-5-0.45 MEQ/L-%-% IV SOLN
INTRAVENOUS | Status: DC
  Filled 2020-06-11 (×3): qty 1000

## 2020-06-11 MED ORDER — OXYCODONE HCL 5 MG PO TABS
5.0000 mg | ORAL_TABLET | ORAL | Status: DC | PRN
  Administered 2020-06-11: 10 mg via ORAL
  Filled 2020-06-11: qty 2

## 2020-06-11 MED ORDER — MONTELUKAST SODIUM 10 MG PO TABS
10.0000 mg | ORAL_TABLET | Freq: Every day | ORAL | Status: DC | PRN
Start: 2020-06-11 — End: 2020-06-15

## 2020-06-11 MED ORDER — VANCOMYCIN HCL 1500 MG/300ML IV SOLN
1500.0000 mg | Freq: Once | INTRAVENOUS | Status: AC
  Administered 2020-06-11: 1500 mg via INTRAVENOUS
  Filled 2020-06-11: qty 300

## 2020-06-11 MED ORDER — ONDANSETRON HCL 4 MG/2ML IJ SOLN
4.0000 mg | Freq: Four times a day (QID) | INTRAMUSCULAR | Status: DC | PRN
  Administered 2020-06-12 (×2): 4 mg via INTRAVENOUS
  Filled 2020-06-11 (×2): qty 2

## 2020-06-11 MED ORDER — ALBUTEROL SULFATE HFA 108 (90 BASE) MCG/ACT IN AERS
2.0000 | INHALATION_SPRAY | RESPIRATORY_TRACT | Status: DC | PRN
  Filled 2020-06-11: qty 6.7

## 2020-06-11 MED ORDER — METHOCARBAMOL 1000 MG/10ML IJ SOLN
500.0000 mg | Freq: Four times a day (QID) | INTRAVENOUS | Status: DC | PRN
  Filled 2020-06-11: qty 5

## 2020-06-11 MED ORDER — LOSARTAN POTASSIUM 50 MG PO TABS
50.0000 mg | ORAL_TABLET | Freq: Every day | ORAL | Status: DC
  Administered 2020-06-12 – 2020-06-15 (×4): 50 mg via ORAL
  Filled 2020-06-11 (×5): qty 1

## 2020-06-11 MED ORDER — SODIUM CHLORIDE 0.9 % IV SOLN
2.0000 g | INTRAVENOUS | Status: DC
  Administered 2020-06-11 – 2020-06-12 (×2): 2 g via INTRAVENOUS
  Filled 2020-06-11 (×2): qty 20

## 2020-06-11 MED ORDER — FLUTICASONE PROPIONATE 50 MCG/ACT NA SUSP
2.0000 | Freq: Every day | NASAL | Status: DC | PRN
  Filled 2020-06-11: qty 16

## 2020-06-11 MED ORDER — ENOXAPARIN SODIUM 40 MG/0.4ML ~~LOC~~ SOLN
40.0000 mg | SUBCUTANEOUS | Status: DC
  Administered 2020-06-11: 40 mg via SUBCUTANEOUS
  Filled 2020-06-11: qty 0.4

## 2020-06-11 MED ORDER — ROSUVASTATIN CALCIUM 5 MG PO TABS
10.0000 mg | ORAL_TABLET | Freq: Every day | ORAL | Status: DC
  Administered 2020-06-12 – 2020-06-15 (×4): 10 mg via ORAL
  Filled 2020-06-11 (×5): qty 2

## 2020-06-11 MED ORDER — AZELASTINE HCL 0.1 % NA SOLN
1.0000 | Freq: Two times a day (BID) | NASAL | Status: DC | PRN
  Filled 2020-06-11: qty 30

## 2020-06-11 MED ORDER — VANCOMYCIN HCL 1250 MG/250ML IV SOLN
1250.0000 mg | INTRAVENOUS | Status: DC
  Administered 2020-06-12: 1250 mg via INTRAVENOUS
  Filled 2020-06-11 (×2): qty 250

## 2020-06-11 MED ORDER — AMLODIPINE BESYLATE 10 MG PO TABS
10.0000 mg | ORAL_TABLET | Freq: Every day | ORAL | Status: DC
  Administered 2020-06-12 – 2020-06-15 (×4): 10 mg via ORAL
  Filled 2020-06-11 (×5): qty 1

## 2020-06-11 NOTE — Progress Notes (Signed)
Pharmacy Antibiotic Note  Kendra Allison is a 65 y.o. female admitted on 06/11/2020 with leg pain/swelling/drainage from prior surgical site.  Pharmacy has been consulted for vancomycin dosing.  Ceftriaxone per MD.    Plan: Vancomycin 1500 mg IV x 1, then 1250 mg IV every 24 hours (Est AUC 461, Goal AUC 400-550, SCr 1.06) Monitor renal function, clinical progression and ortho plans Vancomycin levels as needed  Height: 5\' 6"  (167.6 cm) Weight: 80.7 kg (178 lb) IBW/kg (Calculated) : 59.3  Temp (24hrs), Avg:99.6 F (37.6 C), Min:99.6 F (37.6 C), Max:99.6 F (37.6 C)  Recent Labs  Lab 06/11/20 1534  WBC 6.5  CREATININE 1.06*  LATICACIDVEN 1.0    Estimated Creatinine Clearance: 58.2 mL/min (A) (by C-G formula based on SCr of 1.06 mg/dL (H)).    No Known Allergies  Bertis Ruddy, PharmD Clinical Pharmacist ED Pharmacist Phone # [phone] 06/11/2020 4:53 PM

## 2020-06-11 NOTE — ED Triage Notes (Signed)
Pt reports breaking her leg in nov and had surgery at that time. Sent here by ortho today due to having leg pain, swelling and drainage from site. She was told that plan of care was surgery in am. No acute distress is noted at triage.

## 2020-06-11 NOTE — Anesthesia Preprocedure Evaluation (Addendum)
Anesthesia Evaluation  Patient identified by MRN, date of birth, ID band Patient awake    Reviewed: Allergy & Precautions, NPO status , Patient's Chart, lab work & pertinent test results  Airway Mallampati: II  TM Distance: >3 FB Neck ROM: Full    Dental  (+) Teeth Intact, Dental Advisory Given   Pulmonary asthma ,    Pulmonary exam normal breath sounds clear to auscultation       Cardiovascular hypertension, Pt. on medications (-) angina(-) Past MI Normal cardiovascular exam Rhythm:Regular Rate:Normal     Neuro/Psych negative neurological ROS  negative psych ROS   GI/Hepatic Neg liver ROS, GERD  ,  Endo/Other  negative endocrine ROS  Renal/GU negative Renal ROS     Musculoskeletal negative musculoskeletal ROS (+)   Abdominal   Peds  Hematology negative hematology ROS (+)   Anesthesia Other Findings Day of surgery medications reviewed with the patient.  Reproductive/Obstetrics                            Anesthesia Physical  Anesthesia Plan  ASA: III  Anesthesia Plan: General   Post-op Pain Management:    Induction: Intravenous  PONV Risk Score and Plan: 4 or greater and Ondansetron, Midazolam, Dexamethasone and Scopolamine patch - Pre-op  Airway Management Planned: LMA  Additional Equipment: None  Intra-op Plan:   Post-operative Plan: Extubation in OR  Informed Consent: I have reviewed the patients History and Physical, chart, labs and discussed the procedure including the risks, benefits and alternatives for the proposed anesthesia with the patient or authorized representative who has indicated his/her understanding and acceptance.     Dental advisory given  Plan Discussed with: CRNA  Anesthesia Plan Comments:        Anesthesia Quick Evaluation

## 2020-06-12 ENCOUNTER — Inpatient Hospital Stay (HOSPITAL_COMMUNITY): Payer: Federal, State, Local not specified - PPO | Admitting: Anesthesiology

## 2020-06-12 ENCOUNTER — Inpatient Hospital Stay (HOSPITAL_COMMUNITY): Payer: Federal, State, Local not specified - PPO

## 2020-06-12 ENCOUNTER — Encounter (HOSPITAL_COMMUNITY)
Admission: EM | Disposition: A | Discharge status: Home Health | Payer: Self-pay | Source: Home / Self Care | Attending: Student

## 2020-06-12 ENCOUNTER — Inpatient Hospital Stay (HOSPITAL_COMMUNITY)
Admission: RE | Admit: 2020-06-12 | Payer: Federal, State, Local not specified - PPO | Source: Home / Self Care | Admitting: Student

## 2020-06-12 ENCOUNTER — Encounter (HOSPITAL_COMMUNITY): Payer: Self-pay | Admitting: Student

## 2020-06-12 DIAGNOSIS — S82302A Unspecified fracture of lower end of left tibia, initial encounter for closed fracture: Secondary | ICD-10-CM | POA: Diagnosis not present

## 2020-06-12 DIAGNOSIS — M7989 Other specified soft tissue disorders: Secondary | ICD-10-CM | POA: Diagnosis not present

## 2020-06-12 DIAGNOSIS — S82872B Displaced pilon fracture of left tibia, initial encounter for open fracture type I or II: Secondary | ICD-10-CM | POA: Diagnosis not present

## 2020-06-12 DIAGNOSIS — Z9889 Other specified postprocedural states: Secondary | ICD-10-CM | POA: Diagnosis not present

## 2020-06-12 DIAGNOSIS — T8149XA Infection following a procedure, other surgical site, initial encounter: Secondary | ICD-10-CM

## 2020-06-12 DIAGNOSIS — J45909 Unspecified asthma, uncomplicated: Secondary | ICD-10-CM | POA: Diagnosis not present

## 2020-06-12 DIAGNOSIS — E7800 Pure hypercholesterolemia, unspecified: Secondary | ICD-10-CM | POA: Diagnosis not present

## 2020-06-12 DIAGNOSIS — S82402A Unspecified fracture of shaft of left fibula, initial encounter for closed fracture: Secondary | ICD-10-CM | POA: Diagnosis not present

## 2020-06-12 DIAGNOSIS — S82202C Unspecified fracture of shaft of left tibia, initial encounter for open fracture type IIIA, IIIB, or IIIC: Secondary | ICD-10-CM | POA: Diagnosis not present

## 2020-06-12 DIAGNOSIS — S82452A Displaced comminuted fracture of shaft of left fibula, initial encounter for closed fracture: Secondary | ICD-10-CM | POA: Diagnosis not present

## 2020-06-12 DIAGNOSIS — I10 Essential (primary) hypertension: Secondary | ICD-10-CM | POA: Diagnosis not present

## 2020-06-12 DIAGNOSIS — S82872C Displaced pilon fracture of left tibia, initial encounter for open fracture type IIIA, IIIB, or IIIC: Secondary | ICD-10-CM | POA: Diagnosis not present

## 2020-06-12 DIAGNOSIS — S82402C Unspecified fracture of shaft of left fibula, initial encounter for open fracture type IIIA, IIIB, or IIIC: Secondary | ICD-10-CM | POA: Diagnosis not present

## 2020-06-12 HISTORY — PX: I & D EXTREMITY: SHX5045

## 2020-06-12 LAB — RESP PANEL BY RT-PCR (FLU A&B, COVID) ARPGX2
Influenza A by PCR: NEGATIVE
Influenza B by PCR: NEGATIVE
SARS Coronavirus 2 by RT PCR: NEGATIVE

## 2020-06-12 LAB — HIV ANTIBODY (ROUTINE TESTING W REFLEX): HIV Screen 4th Generation wRfx: NONREACTIVE

## 2020-06-12 LAB — MRSA PCR SCREENING: MRSA by PCR: NEGATIVE

## 2020-06-12 SURGERY — IRRIGATION AND DEBRIDEMENT EXTREMITY
Anesthesia: General | Site: Leg Lower | Laterality: Left

## 2020-06-12 MED ORDER — ASPIRIN 325 MG PO TABS
325.0000 mg | ORAL_TABLET | Freq: Every day | ORAL | Status: DC
  Administered 2020-06-13 – 2020-06-15 (×3): 325 mg via ORAL
  Filled 2020-06-12 (×3): qty 1

## 2020-06-12 MED ORDER — PROMETHAZINE HCL 25 MG/ML IJ SOLN: 6.2500 mg | INTRAMUSCULAR | Status: DC | PRN

## 2020-06-12 MED ORDER — LIDOCAINE 2% (20 MG/ML) 5 ML SYRINGE
INTRAMUSCULAR | Status: DC | PRN
  Administered 2020-06-12: 100 mg via INTRAVENOUS

## 2020-06-12 MED ORDER — VANCOMYCIN HCL 1000 MG IV SOLR
INTRAVENOUS | Status: DC | PRN
  Administered 2020-06-12: 1000 mg via TOPICAL

## 2020-06-12 MED ORDER — ACETAMINOPHEN 325 MG PO TABS
650.0000 mg | ORAL_TABLET | Freq: Four times a day (QID) | ORAL | Status: DC
  Administered 2020-06-12 – 2020-06-13 (×3): 650 mg via ORAL
  Filled 2020-06-12 (×3): qty 2

## 2020-06-12 MED ORDER — FENTANYL CITRATE (PF) 100 MCG/2ML IJ SOLN
INTRAMUSCULAR | Status: DC | PRN
  Administered 2020-06-12: 50 ug via INTRAVENOUS

## 2020-06-12 MED ORDER — ACETAMINOPHEN 500 MG PO TABS
1000.0000 mg | ORAL_TABLET | Freq: Once | ORAL | Status: AC
  Administered 2020-06-12: 1000 mg via ORAL

## 2020-06-12 MED ORDER — MIDAZOLAM HCL 5 MG/5ML IJ SOLN
INTRAMUSCULAR | Status: DC | PRN
  Administered 2020-06-12: 2 mg via INTRAVENOUS

## 2020-06-12 MED ORDER — FENTANYL CITRATE (PF) 100 MCG/2ML IJ SOLN
INTRAMUSCULAR | Status: AC
  Filled 2020-06-12: qty 2

## 2020-06-12 MED ORDER — OXYCODONE HCL 5 MG PO TABS
5.0000 mg | ORAL_TABLET | ORAL | Status: DC | PRN
  Administered 2020-06-12: 10 mg via ORAL
  Administered 2020-06-12: 5 mg via ORAL
  Filled 2020-06-12 (×2): qty 2

## 2020-06-12 MED ORDER — PROPOFOL 10 MG/ML IV BOLUS
INTRAVENOUS | Status: DC | PRN
  Administered 2020-06-12: 150 mg via INTRAVENOUS

## 2020-06-12 MED ORDER — HYDROMORPHONE HCL 1 MG/ML IJ SOLN
0.5000 mg | Freq: Once | INTRAMUSCULAR | Status: AC
  Administered 2020-06-12: 0.5 mg via INTRAVENOUS

## 2020-06-12 MED ORDER — DEXAMETHASONE SODIUM PHOSPHATE 10 MG/ML IJ SOLN
INTRAMUSCULAR | Status: DC | PRN
  Administered 2020-06-12: 5 mg via INTRAVENOUS

## 2020-06-12 MED ORDER — SODIUM CHLORIDE 0.9 % IR SOLN
Status: DC | PRN
  Administered 2020-06-12: 3000 mL

## 2020-06-12 MED ORDER — 0.9 % SODIUM CHLORIDE (POUR BTL) OPTIME
TOPICAL | Status: DC | PRN
  Administered 2020-06-12: 1000 mL

## 2020-06-12 MED ORDER — LACTATED RINGERS IV SOLN: INTRAVENOUS | Status: DC | PRN

## 2020-06-12 MED ORDER — HYDROMORPHONE HCL 1 MG/ML IJ SOLN
INTRAMUSCULAR | Status: AC
  Filled 2020-06-12: qty 1

## 2020-06-12 MED ORDER — ACETAMINOPHEN 500 MG PO TABS: 1000.0000 mg | ORAL_TABLET | Freq: Once | ORAL | Status: DC

## 2020-06-12 MED ORDER — SCOPOLAMINE 1 MG/3DAYS TD PT72
1.0000 | MEDICATED_PATCH | Freq: Once | TRANSDERMAL | Status: DC
  Administered 2020-06-12: 1.5 mg via TRANSDERMAL

## 2020-06-12 MED ORDER — FENTANYL CITRATE (PF) 100 MCG/2ML IJ SOLN
25.0000 ug | INTRAMUSCULAR | Status: DC | PRN
  Administered 2020-06-12 (×3): 50 ug via INTRAVENOUS

## 2020-06-12 MED ORDER — SCOPOLAMINE 1 MG/3DAYS TD PT72: 1.0000 | MEDICATED_PATCH | Freq: Once | TRANSDERMAL | Status: DC

## 2020-06-12 MED ORDER — HYDRALAZINE HCL 10 MG PO TABS: 10.0000 mg | ORAL_TABLET | Freq: Four times a day (QID) | ORAL | Status: DC | PRN

## 2020-06-12 SURGICAL SUPPLY — 38 items
BNDG ELASTIC 4X5.8 VLCR STR LF (GAUZE/BANDAGES/DRESSINGS) ×3 IMPLANT
CANISTER WOUNDNEG PRESSURE 500 (CANNISTER) ×3 IMPLANT
CHLORAPREP W/TINT 26 (MISCELLANEOUS) ×3 IMPLANT
COVER SURGICAL LIGHT HANDLE (MISCELLANEOUS) ×3 IMPLANT
DRAPE ORTHO SPLIT 77X108 STRL (DRAPES) ×2
DRAPE SURG 17X23 STRL (DRAPES) ×3 IMPLANT
DRAPE SURG ORHT 6 SPLT 77X108 (DRAPES) ×1 IMPLANT
DRAPE U-SHAPE 47X51 STRL (DRAPES) ×3 IMPLANT
DRESSING PEEL AND PLC PRVNA 13 (GAUZE/BANDAGES/DRESSINGS) ×1 IMPLANT
DRSG PEEL AND PLACE PREVENA 13 (GAUZE/BANDAGES/DRESSINGS) ×3
ELECT REM PT RETURN 9FT ADLT (ELECTROSURGICAL)
ELECTRODE REM PT RTRN 9FT ADLT (ELECTROSURGICAL) IMPLANT
GLOVE BIO SURGEON STRL SZ 6.5 (GLOVE) ×6 IMPLANT
GLOVE BIO SURGEON STRL SZ7.5 (GLOVE) ×12 IMPLANT
GLOVE BIO SURGEONS STRL SZ 6.5 (GLOVE) ×3
GLOVE BIOGEL PI IND STRL 7.5 (GLOVE) ×1 IMPLANT
GLOVE BIOGEL PI INDICATOR 7.5 (GLOVE) ×2
GLOVE SURG UNDER POLY LF SZ6.5 (GLOVE) ×3 IMPLANT
GOWN STRL REUS W/ TWL LRG LVL3 (GOWN DISPOSABLE) ×2 IMPLANT
GOWN STRL REUS W/TWL LRG LVL3 (GOWN DISPOSABLE) ×4
HANDPIECE INTERPULSE COAX TIP (DISPOSABLE) ×2
KIT BASIN OR (CUSTOM PROCEDURE TRAY) ×3 IMPLANT
KIT TURNOVER KIT B (KITS) ×3 IMPLANT
MANIFOLD NEPTUNE II (INSTRUMENTS) ×3 IMPLANT
NS IRRIG 1000ML POUR BTL (IV SOLUTION) ×3 IMPLANT
PACK ORTHO EXTREMITY (CUSTOM PROCEDURE TRAY) ×3 IMPLANT
PAD ARMBOARD 7.5X6 YLW CONV (MISCELLANEOUS) ×6 IMPLANT
PAD CAST 4YDX4 CTTN HI CHSV (CAST SUPPLIES) ×1 IMPLANT
PADDING CAST COTTON 4X4 STRL (CAST SUPPLIES) ×2
SET HNDPC FAN SPRY TIP SCT (DISPOSABLE) ×1 IMPLANT
SUT ETHILON 2 0 FS 18 (SUTURE) ×6 IMPLANT
TOWEL GREEN STERILE (TOWEL DISPOSABLE) ×6 IMPLANT
TOWEL GREEN STERILE FF (TOWEL DISPOSABLE) ×3 IMPLANT
TUBE CONNECTING 12'X1/4 (SUCTIONS) ×1
TUBE CONNECTING 12X1/4 (SUCTIONS) ×2 IMPLANT
UNDERPAD 30X36 HEAVY ABSORB (UNDERPADS AND DIAPERS) ×3 IMPLANT
WATER STERILE IRR 1000ML POUR (IV SOLUTION) IMPLANT
YANKAUER SUCT BULB TIP NO VENT (SUCTIONS) ×3 IMPLANT

## 2020-06-12 NOTE — Transfer of Care (Signed)
Immediate Anesthesia Transfer of Care Note  Patient: Kendra Allison  Procedure(s) Performed: IRRIGATION AND DEBRIDEMENT EXTREMITY (Left Leg Lower)  Patient Location: PACU  Anesthesia Type:General  Level of Consciousness: drowsy and patient cooperative  Airway & Oxygen Therapy: Patient Spontanous Breathing and Patient connected to nasal cannula oxygen  Post-op Assessment: Report given to RN, Post -op Vital signs reviewed and stable and Patient moving all extremities  Post vital signs: Reviewed and stable  Last Vitals:  Vitals Value Taken Time  BP 159/96 06/12/20 0921  Temp    Pulse 84 06/12/20 0921  Resp 11 06/12/20 0921  SpO2 100 % 06/12/20 0921  Vitals shown include unvalidated device data.  Last Pain:  Vitals:   06/12/20 0251  TempSrc: Oral  PainSc:       Patients Stated Pain Goal: 4 (63/14/97 0263)  Complications: No complications documented.

## 2020-06-12 NOTE — Op Note (Signed)
Orthopaedic Surgery Operative Note (CSN: [id number] ) Date of Surgery: 06/12/2020  Admit Date: 06/11/2020   Diagnoses: Pre-Op Diagnoses: Type IIIA open tibial shaft/pilon fracture Left leg postoperative infection  Post-Op Diagnosis: Same  Procedures: 1. CPT 10180-Irrigation and drainage of left leg postoperative infection  Surgeons : Primary: Shona Needles, MD  Assistant: [name], PA-C  Location: OR 7   Anesthesia:General  Antibiotics: Schedule Vancomycin and Ceftriaxone upon admission with topical vancomycin powder   Tourniquet time:None  Estimated Blood BOFB:51 mL  Complications:None   Specimens: ID Type Source Tests Collected by Time Destination  A : Left Leg Infection Wound Leg, Left AEROBIC/ANAEROBIC CULTURE (SURGICAL/DEEP WOUND) Givanni Staron, Thomasene Lot, MD 06/12/2020 [phone]      Implants: * No implants in log *   Indications for Surgery: 65 year old female who fell through one of her ceilings in November 2021.  She sustained a left open tibia/pilon fracture.  She underwent initial I&D and external fixation and subsequent definitive fixation of her left tibia.  She was doing well and her sutures were removed uneventfully.  However after this she had some dehiscence of the wound as well as some drainage.  She was started on oral doxycycline however it persisted and worsened with opening up of the wound.  Due to the continued drainage and wound around her fracture site I recommended proceeding to the operating room for irrigation and debridement and revision closure with cultures that would be taken.  I would plan for broad-spectrum antibiotics until cultures returned.  Risks and benefits were discussed with the patient.  Risks included persistent infection, continued dehiscence, need for soft tissue coverage, nonunion of the fracture, even the possibility of anesthetic complications.  The patient agreed to proceed with surgery and consent was obtained.  Operative  Findings: Irrigation and debridement of left lower extremity with excision of skin edges of dehisced wound with debridement of the soft tissue.  The wound did probe to the fracture site however there was no signs of any osteomyelitis or gross purulence at the fracture site  Procedure: The patient was identified in the preoperative holding area. Consent was confirmed with the patient and their family and all questions were answered. The operative extremity was marked after confirmation with the patient. she was then brought back to the operating room by our anesthesia colleagues.  She was placed under general anesthetic and carefully transferred over to a radiolucent flat top table.  The left lower extremity was then prepped and draped in usual sterile fashion.  A timeout was performed to verify the patient, the procedure, and the extremity.  Preoperative antibiotics had already been given on the floor.  I started by excisionally removing the wound edges with a 10 blade.  I excised the skin until it is back to healthy tissue.  I then used a Cobb elevator to debride excisionally the material that was at the base of the wound.  The unhealthy soft tissue was excised and sent to the lab as micro specimen for culture.  There was soft tissue at the base with no bone that was exposed however I did explore the wound and there was a area that probes down to the fracture site.  There was no gross purulence at the fracture site and the bone appeared without any signs of osteomyelitis.  Once the wound bed was cleaned I then used low pressure pulsatile lavage to thoroughly irrigate the wound with 3 L.  I then changed my gloves placed a gram of vancomycin  powder and proceeded to close the wound with soft tissue rearrangement using 2-0 nylon.  An incisional wound VAC was placed.  A sterile dressing was applied.  The patient was then awoken from anesthesia and taken to the PACU in stable condition.   Debridement type:  Excisional Debridement  Side: left  Body Location: Medial lower leg  Tools used for debridement: scalpel, curette and rongeur  Pre-debridement Wound size (cm):   Length: 3 cm        width: 2.5 cm     depth: 1 cm  Post-debridement Wound size (cm):   Length: 4 cm        width: 3 cm     depth: 1 cm  Debridement depth beyond dead/damaged tissue down to healthy viable tissue: yes  Tissue layer involved: skin, subcutaneous tissue, muscle / fascia  Nature of tissue removed: Slough, Nutritional therapist and Non-viable tissue  Irrigation volume: 3 L     Irrigation fluid type: Normal Saline   Post Op Plan/Instructions: The patient will be nonweightbearing to the left lower extremity.  Will continue broad-spectrum antibiotics and await cultures to return.  She will likely need a PICC line and IV antibiotics for at least 4 to 6 weeks.  We will likely consult infectious disease after 24 hours of culture.  Aspirin for DVT prophylaxis while inpatient.  I was present and performed the entire surgery.  [name], PA-C did assist me throughout the case. An assistant was necessary given the difficulty in approach, maintenance of reduction and ability to instrument the fracture.   Katha Hamming, MD [facility]

## 2020-06-12 NOTE — Anesthesia Procedure Notes (Signed)
Procedure Name: LMA Insertion Date/Time: 06/12/2020 8:36 AM Performed by: Moshe Salisbury, CRNA Pre-anesthesia Checklist: Patient identified, Emergency Drugs available, Suction available and Patient being monitored Patient Re-evaluated:Patient Re-evaluated prior to induction Oxygen Delivery Method: Circle System Utilized Preoxygenation: Pre-oxygenation with 100% oxygen Induction Type: IV induction Ventilation: Mask ventilation without difficulty LMA: LMA inserted LMA Size: 4.0 Number of attempts: 1 Placement Confirmation: positive ETCO2 Tube secured with: Tape Dental Injury: Teeth and Oropharynx as per pre-operative assessment

## 2020-06-12 NOTE — Progress Notes (Signed)
Contacted on call for an order for a covid test. Pt will be going to surgery in AM. Collected at this time and taken to lab by charge RN. Still waiting on order.

## 2020-06-12 NOTE — Anesthesia Postprocedure Evaluation (Signed)
Anesthesia Post Note  Patient: Kendra Allison  Procedure(s) Performed: IRRIGATION AND DEBRIDEMENT EXTREMITY (Left Leg Lower)     Patient location during evaluation: PACU Anesthesia Type: General Level of consciousness: awake and alert Pain management: pain level controlled Vital Signs Assessment: post-procedure vital signs reviewed and stable Respiratory status: spontaneous breathing, nonlabored ventilation, respiratory function stable and patient connected to nasal cannula oxygen Cardiovascular status: blood pressure returned to baseline and stable Postop Assessment: no apparent nausea or vomiting Anesthetic complications: no   No complications documented.  Last Vitals:  Vitals:   06/12/20 1300 06/12/20 1612  BP: 120/77 123/79  Pulse: 78 69  Resp: 16 16  Temp: 36.9 C 36.9 C  SpO2: 98% 97%    Last Pain:  Vitals:   06/12/20 1612  TempSrc: Oral  PainSc:                  Catalina Gravel

## 2020-06-12 NOTE — Progress Notes (Signed)
CHG bath given at this time. Blood consent signed. Pt remains NPO. Order for covid test just came in. Contacted lab to let them know about covid test order. They stated results should be back within a hour.

## 2020-06-12 NOTE — H&P (Signed)
[facility]edic [facility]uma [facility] (OTS) Consult   Patient ID: Kendra Allison MRN: [id number] DOB/AGE: August 11, 1955 65 y.o.  Reason for Surgery: Left leg infection  HPI: Kendra Allison is an 65 y.o. female patient present with left leg infection. Patient had open left open tibia shaft/pilon fracture from fall through her ceiling. She underwent initial I&D and external fixation with myself upon arrival to ED. She subsequent underwent ORIF/IMN of left tibia. She received appropriate open fracture antibiotics. She was subsequently discharged home. Her wound was healing fine and sutures were removed. She then developed some discharge and some wound dehiscence. I saw her in the office 2 weeks ago and started her on PO doxycycline. Since that time the drainage has worsened and the wound has opened up even further. She was to be seen yesterday but our office was closed due to the weather. I called her and she was running low grade fevers and chills with worsened drainage so I told her to come to ER for possible I&D.  Past Medical History:  Diagnosis Date  . Asthma   . GERD (gastroesophageal reflux disease)   . Hypercholesteremia   . Hyperlipidemia   . Hypertension   . Osteoporosis     Past Surgical History:  Procedure Laterality Date  . ABDOMINAL HYSTERECTOMY    . BREAST BIOPSY    . COLONOSCOPY WITH PROPOFOL N/A 08/10/2019   Procedure: COLONOSCOPY WITH PROPOFOL;  Surgeon: Jonathon Bellows, MD;  Location: [facility] ENDOSCOPY;  Service: Gastroenterology;  Laterality: N/A;  . ESOPHAGOGASTRODUODENOSCOPY (EGD) WITH PROPOFOL N/A 08/10/2019   Procedure: ESOPHAGOGASTRODUODENOSCOPY (EGD) WITH PROPOFOL;  Surgeon: Jonathon Bellows, MD;  Location: [facility] ENDOSCOPY;  Service: Gastroenterology;  Laterality: N/A;  . EXTERNAL FIXATION LEG Left 04/07/2020    EXTERNAL FIXATION LEG (Left Leg Lower)  . EXTERNAL FIXATION LEG Left 04/07/2020   Procedure: EXTERNAL FIXATION LEG;  Surgeon: Shona Needles, MD;  Location: [place];  Service:  Orthopedics;  Laterality: Left;  . EXTERNAL FIXATION REMOVAL Left 04/10/2020   Procedure: REMOVAL EXTERNAL FIXATION LEG;  Surgeon: Shona Needles, MD;  Location: [place];  Service: Orthopedics;  Laterality: Left;  . I & D EXTREMITY Left 04/07/2020   Procedure: IRRIGATION AND DEBRIDEMENT EXTREMITY;  Surgeon: Shona Needles, MD;  Location: [place];  Service: Orthopedics;  Laterality: Left;  . [place]  2014  . TIBIA IM NAIL INSERTION Left 04/10/2020   Procedure: INTRAMEDULLARY (IM) NAIL TIBIAL;  Surgeon: Shona Needles, MD;  Location: [place];  Service: Orthopedics;  Laterality: Left;    History reviewed. No pertinent family history.  Social History:  reports that she has never smoked. She has never used smokeless tobacco. She reports current alcohol use. She reports that she does not use drugs.  Allergies: No Known Allergies  Medications:  No current facility-administered medications on file prior to encounter.   Current Outpatient Medications on File Prior to Encounter  Medication Sig Dispense Refill  . albuterol (VENTOLIN HFA) 108 (90 Base) MCG/ACT inhaler Inhale 2 puffs into the lungs every 4 (four) hours as needed for wheezing or shortness of breath (cough). 1 each 2  . alendronate (FOSAMAX) 70 MG tablet Take 1 tablet (70 mg total) by mouth once a week. Take with a full glass of water on an empty stomach. (Patient not taking: No sig reported) 12 tablet 3  . amLODipine (NORVASC) 10 MG tablet Take 1 tablet (10 mg total) by mouth daily. 90 tablet 1  . azelastine (ASTELIN) 0.1 % nasal spray Place 1-2 sprays into  both nostrils 2 (two) times daily as needed for allergies.     . Azelastine-Fluticasone 137-50 MCG/ACT SUSP Place 2 sprays into the nose daily. (Patient not taking: No sig reported) 23 g 3  . cetirizine (ZYRTEC) 10 MG tablet Take 10 mg by mouth daily as needed for allergies.    Marland Kitchen docusate sodium (COLACE) 100 MG capsule Take 1 capsule (100 mg total) by mouth 2 (two) times daily. (Patient  not taking: No sig reported) 14 capsule 0  . doxycycline (MONODOX) 100 MG capsule Take 100 mg by mouth 2 (two) times daily.    Marland Kitchen enoxaparin (LOVENOX) 40 MG/0.4ML injection Inject 0.4 mLs (40 mg total) into the skin daily for 28 days. (Patient not taking: Reported on 06/11/2020) 11.2 mL 0  . EPINEPHrine 0.3 mg/0.3 mL IJ SOAJ injection Inject 0.3 mg into the muscle as needed for anaphylaxis (for allergy shots).    . ferrous sulfate 325 (65 FE) MG EC tablet Take 1 tablet (325 mg total) by mouth 3 (three) times daily with meals. 90 tablet 2  . fluticasone (FLONASE) 50 MCG/ACT nasal spray Place 2 sprays into both nostrils daily as needed for allergies.    Marland Kitchen gabapentin (NEURONTIN) 100 MG capsule Take 1 capsule (100 mg total) by mouth 3 (three) times daily. (Patient not taking: Reported on 06/11/2020) 21 capsule 0  . hydrOXYzine (ATARAX/VISTARIL) 25 MG tablet Take 1 tablet (25 mg total) by mouth at bedtime as needed (insomnia). (Patient not taking: Reported on 06/11/2020) 30 tablet 2  . ketoconazole (NIZORAL) 2 % cream Apply 1 application topically 2 (two) times daily. For up to 4 weeks or until resolved (Patient not taking: Reported on 06/11/2020) 30 g 1  . losartan (COZAAR) 50 MG tablet Take 1 tablet (50 mg total) by mouth daily. 90 tablet 1  . methocarbamol (ROBAXIN) 500 MG tablet Take 1 tablet (500 mg total) by mouth every 6 (six) hours as needed for muscle spasms. (Patient not taking: Reported on 06/11/2020) 28 tablet 0  . montelukast (SINGULAIR) 10 MG tablet Take 1 tablet (10 mg total) by mouth at bedtime. (Patient taking differently: Take 10 mg by mouth daily as needed (respiratory/allergies.).) 90 tablet 3  . Multiple Vitamin (MULTIVITAMIN WITH MINERALS) TABS tablet Take 1 tablet by mouth daily. One-A-Day Multivitamin    . oxyCODONE (OXY IR/ROXICODONE) 5 MG immediate release tablet Take 1 tablet (5 mg total) by mouth every 4 (four) hours as needed for severe pain. (Patient not taking: Reported on 06/11/2020)  42 tablet 0  . rosuvastatin (CRESTOR) 10 MG tablet TAKE 1 TABLET(10 MG) BY MOUTH DAILY (Patient taking differently: Take 10 mg by mouth daily.) 90 tablet 2  . triamcinolone cream (KENALOG) 0.5 % Apply 1 application topically 2 (two) times daily. For up to 2 weeks as needed can repeat. (Patient not taking: Reported on 06/11/2020) 30 g 1    ROS: Constitutional: +Chills Vision: No changes in vision ENT: No difficulty swallowing CV: No chest pain Pulm: No SOB or wheezing GI: No nausea or vomiting GU: No urgency or inability to hold urine Skin: No poor wound healing Neurologic: No numbness or tingling Psychiatric: No depression or anxiety Heme: No bruising Allergic: No reaction to medications or food   Exam: Blood pressure 133/90, pulse 74, temperature 98.6 F (37 C), temperature source Oral, resp. rate 17, height 5\' 6"  (1.676 m), weight 80.7 kg, SpO2 99 %. General:NAD Orientation:AAOx3 Mood and Affect: Cooperative and pleasant Gait: Unable to assess due to fracture Coordination and  balance: WNL  Left lower extremity: Previous open fracture wound with gapping and serosang drainage. Moderate swelling to leg. Neuro intact.  RLE: Skin without lesions. No tenderness to palpation. Full painless ROM, full strength in each muscle groups without evidence of instability.   Medical Decision Making: Data: Imaging: No new imaging  Labs:  Results for orders placed or performed during the hospital encounter of 06/11/20 (from the past 24 hour(s))  Comprehensive metabolic panel     Status: Abnormal   Collection Time: 06/11/20  3:34 PM  Result Value Ref Range   Sodium 138 135 - 145 mmol/L   Potassium 3.8 3.5 - 5.1 mmol/L   Chloride 103 98 - 111 mmol/L   CO2 25 22 - 32 mmol/L   Glucose, Bld 110 (H) 70 - 99 mg/dL   BUN 16 8 - 23 mg/dL   Creatinine, Ser 1.06 (H) 0.44 - 1.00 mg/dL   Calcium 10.8 (H) 8.9 - 10.3 mg/dL   Total Protein 7.7 6.5 - 8.1 g/dL   Albumin 4.8 3.5 - 5.0 g/dL   AST 17 15  - 41 U/L   ALT 16 0 - 44 U/L   Alkaline Phosphatase 64 38 - 126 U/L   Total Bilirubin 0.7 0.3 - 1.2 mg/dL   GFR, Estimated 59 (L) >60 mL/min   Anion gap 10 5 - 15  CBC with Differential     Status: None   Collection Time: 06/11/20  3:34 PM  Result Value Ref Range   WBC 6.5 4.0 - 10.5 K/uL   RBC 4.27 3.87 - 5.11 MIL/uL   Hemoglobin 12.6 12.0 - 15.0 g/dL   HCT 40.0 36.0 - 46.0 %   MCV 93.7 80.0 - 100.0 fL   MCH 29.5 26.0 - 34.0 pg   MCHC 31.5 30.0 - 36.0 g/dL   RDW 12.6 11.5 - 15.5 %   Platelets 336 150 - 400 K/uL   nRBC 0.0 0.0 - 0.2 %   Neutrophils Relative % 67 %   Neutro Abs 4.4 1.7 - 7.7 K/uL   Lymphocytes Relative 24 %   Lymphs Abs 1.6 0.7 - 4.0 K/uL   Monocytes Relative 6 %   Monocytes Absolute 0.4 0.1 - 1.0 K/uL   Eosinophils Relative 1 %   Eosinophils Absolute 0.1 0.0 - 0.5 K/uL   Basophils Relative 1 %   Basophils Absolute 0.1 0.0 - 0.1 K/uL   Immature Granulocytes 1 %   Abs Immature Granulocytes 0.03 0.00 - 0.07 K/uL  Lactic acid, plasma     Status: None   Collection Time: 06/11/20  3:34 PM  Result Value Ref Range   Lactic Acid, Venous 1.0 0.5 - 1.9 mmol/L  Sedimentation rate     Status: None   Collection Time: 06/11/20  6:30 PM  Result Value Ref Range   Sed Rate 19 0 - 22 mm/hr  C-reactive protein     Status: None   Collection Time: 06/11/20  6:30 PM  Result Value Ref Range   CRP <0.5 <1.0 mg/dL  HIV Antibody (routine testing w rflx)     Status: None   Collection Time: 06/11/20  6:30 PM  Result Value Ref Range   HIV Screen 4th Generation wRfx Non Reactive Non Reactive  Lactic acid, plasma     Status: None   Collection Time: 06/11/20  6:32 PM  Result Value Ref Range   Lactic Acid, Venous 0.9 0.5 - 1.9 mmol/L  MRSA PCR Screening     Status: None  Collection Time: 06/12/20 12:54 AM   Specimen: Nasal Mucosa; Nasopharyngeal  Result Value Ref Range   MRSA by PCR NEGATIVE NEGATIVE  Resp Panel by RT-PCR (Flu A&B, Covid) Nasopharyngeal Swab     Status: None    Collection Time: 06/12/20  5:34 AM   Specimen: Nasopharyngeal Swab; Nasopharyngeal(NP) swabs in vial transport medium  Result Value Ref Range   SARS Coronavirus 2 by RT PCR NEGATIVE NEGATIVE   Influenza A by PCR NEGATIVE NEGATIVE   Influenza B by PCR NEGATIVE NEGATIVE    Imaging or Labs ordered: None  Medical history and chart was reviewed and case discussed with medical provider.  Assessment/Plan: 65 yo female with open tibia fracture now with new onset infection  The patient has a postoperative infection with active draining wound with dehiscence.  The infection likely goes down to the bone.  She has failed nonoperative outpatient treatment.  She has developed chills and possible low-grade fevers.  I recommend proceeding to the operating room for irrigation debridement with possible closure versus possible wound VAC placement.  This is a complex problem has the infection is likely into the fracture site.  I will have more information after surgery.  She will likely need a PICC line with long-term IV antibiotics.  Risks and benefits were discussed with the patient including possible nonunion and prolonged IV antibiotics.  We will plan to keep her until cultures return.  Shona Needles, MD [facility] [phone] (office) [email or web address]

## 2020-06-13 ENCOUNTER — Encounter (HOSPITAL_COMMUNITY): Payer: Self-pay | Admitting: Student

## 2020-06-13 DIAGNOSIS — T8149XA Infection following a procedure, other surgical site, initial encounter: Secondary | ICD-10-CM

## 2020-06-13 MED ORDER — DOCUSATE SODIUM 100 MG PO CAPS
100.0000 mg | ORAL_CAPSULE | Freq: Two times a day (BID) | ORAL | Status: DC
  Administered 2020-06-13 (×2): 100 mg via ORAL
  Filled 2020-06-13 (×4): qty 1

## 2020-06-13 MED ORDER — SODIUM CHLORIDE 0.9 % IV SOLN
2.0000 g | Freq: Three times a day (TID) | INTRAVENOUS | Status: DC
  Administered 2020-06-13 – 2020-06-14 (×3): 2 g via INTRAVENOUS
  Filled 2020-06-13 (×3): qty 2

## 2020-06-13 MED ORDER — POLYETHYLENE GLYCOL 3350 17 G PO PACK
17.0000 g | PACK | Freq: Every day | ORAL | Status: DC
  Administered 2020-06-13: 17 g via ORAL
  Filled 2020-06-13 (×3): qty 1

## 2020-06-13 MED ORDER — COVID-19 MRNA VAC-TRIS(PFIZER) 30 MCG/0.3ML IM SUSP
0.3000 mL | Freq: Once | INTRAMUSCULAR | Status: AC
  Administered 2020-06-13: 0.3 mL via INTRAMUSCULAR
  Filled 2020-06-13: qty 0.3

## 2020-06-13 MED ORDER — ACETAMINOPHEN 325 MG PO TABS: 325.0000 mg | ORAL_TABLET | Freq: Four times a day (QID) | ORAL | Status: DC | PRN

## 2020-06-13 MED ORDER — KETOROLAC TROMETHAMINE 15 MG/ML IJ SOLN
15.0000 mg | Freq: Four times a day (QID) | INTRAMUSCULAR | Status: AC
  Administered 2020-06-13 – 2020-06-14 (×5): 15 mg via INTRAVENOUS
  Filled 2020-06-13 (×5): qty 1

## 2020-06-13 MED ORDER — HYDROCODONE-ACETAMINOPHEN 5-325 MG PO TABS: 1.0000 | ORAL_TABLET | ORAL | Status: DC | PRN

## 2020-06-13 MED ORDER — SENNOSIDES-DOCUSATE SODIUM 8.6-50 MG PO TABS: 1.0000 | ORAL_TABLET | Freq: Every evening | ORAL | Status: DC | PRN

## 2020-06-13 MED ORDER — POTASSIUM CHLORIDE IN NACL 20-0.9 MEQ/L-% IV SOLN
INTRAVENOUS | Status: DC
  Filled 2020-06-13: qty 1000

## 2020-06-13 NOTE — Progress Notes (Signed)
Secure message sent to [name], PA in regards to critical value obtained from wound culture results

## 2020-06-13 NOTE — Consult Note (Addendum)
[facility]  Total days of antibiotics 2       Reason for Consult: wound dehiscence/post op wound infection    Referring Physician: haddix  Principal Problem:   Postoperative wound infection Active Problems:   Surgical site infection    HPI: Kendra Allison is a 65 y.o. female with hx of HTN, GERD, asthma who sustained an open left tib/pilon fracture in mid november when she fell through the attic into the garage and landed on her left leg.  she underwent initial I x D with external fixation then transitioned to ORIF with IM nail of left tibia.she had been healing appropriate up until roughly 2-3 wk when she developed wound dehiscence. She was started on oral doxy by dr haddix roughly 2 weeks ago. Due to new onset fevers/chills, dr haddix admitted patient for IXD. OR note suggests that wound probed to the fracture site but there did not appear to have signs of osteomyelitis.nonviable tissue was excised, with tissue sent for culture. She was started empirically on vancomycin and ceftriaxone. Gram stain showed GNR. Which subsequently identified pseudomonas  Past Medical History:  Diagnosis Date  . Asthma   . GERD (gastroesophageal reflux disease)   . Hypercholesteremia   . Hyperlipidemia   . Hypertension   . Osteoporosis     Allergies: No Known Allergies   MEDICATIONS: . amLODipine  10 mg Oral Daily  . aspirin  325 mg Oral Daily  . docusate sodium  100 mg Oral BID  . ketorolac  15 mg Intravenous Q6H  . loratadine  10 mg Oral Daily  . losartan  50 mg Oral Daily  . polyethylene glycol  17 g Oral Daily  . rosuvastatin  10 mg Oral Daily    Social History   Tobacco Use  . Smoking status: Never Smoker  . Smokeless tobacco: Never Used  . Tobacco comment: Quit 40years ago.   Vaping Use  . Vaping Use: Never used  Substance Use Topics  . Alcohol use: Yes    Comment: "rarely"   . Drug use: Never    Family history = HTN, and high cholesterol but no  CAD  Review of Systems -  Constitutional: positive for fever, chills, diaphoresis, activity change, appetite change, fatigue and unexpected weight change.  HENT: Negative for congestion, sore throat, rhinorrhea, sneezing, trouble swallowing and sinus pressure.  Eyes: Negative for photophobia and visual disturbance.  Respiratory: Negative for cough, chest tightness, shortness of breath, wheezing and stridor.  Cardiovascular: Negative for chest pain, palpitations and leg swelling.  Gastrointestinal: Negative for nausea, vomiting, abdominal pain, diarrhea, constipation, blood in stool, abdominal distention and anal bleeding.  Genitourinary: Negative for dysuria, hematuria, flank pain and difficulty urinating.  Musculoskeletal: Negative for myalgias, back pain, joint swelling, arthralgias and gait problem.  Skin: positive for left leg wound.Negative for color change, pallor, rash and wound.  Neurological: Negative for dizziness, tremors, weakness and light-headedness.  Hematological: Negative for adenopathy. Does not bruise/bleed easily.  Psychiatric/Behavioral: Negative for behavioral problems, confusion, sleep disturbance, dysphoric mood, decreased concentration and agitation.      OBJECTIVE: Temp:  [98.4 F (36.9 C)-98.7 F (37.1 C)] 98.6 F (37 C) (01/20 0726) Pulse Rate:  [63-75] 70 (01/20 0726) Resp:  [15-19] 17 (01/20 0726) BP: (123-157)/(79-91) 157/88 (01/20 0837) SpO2:  [97 %-100 %] 100 % (01/20 0726) Physical Exam  Constitutional: He is oriented to person, place, and time. He appears well-developed and well-nourished. No distress.  HENT:  Mouth/Throat: Oropharynx  is clear and moist. No oropharyngeal exudate.  Cardiovascular: Normal rate, regular rhythm and normal heart sounds. Exam reveals no gallop and no friction rub.  No murmur heard.  Pulmonary/Chest: Effort normal and breath sounds normal. No respiratory distress. He has no wheezes.  Abdominal: Soft. Bowel sounds are  normal. He exhibits no distension. There is no tenderness.  Lymphadenopathy:  He has no cervical adenopathy.  Neurological: He is alert and oriented to person, place, and time.  Ext: left leg wrapped with wound vac Skin: Skin is warm and dry. No rash noted. No erythema.  Psychiatric: He has a normal mood and affect. His behavior is normal.     LABS: Results for orders placed or performed during the hospital encounter of 06/11/20 (from the past 48 hour(s))  Comprehensive metabolic panel     Status: Abnormal   Collection Time: 06/11/20  3:34 PM  Result Value Ref Range   Sodium 138 135 - 145 mmol/L   Potassium 3.8 3.5 - 5.1 mmol/L   Chloride 103 98 - 111 mmol/L   CO2 25 22 - 32 mmol/L   Glucose, Bld 110 (H) 70 - 99 mg/dL    Comment: Glucose reference range applies only to samples taken after fasting for at least 8 hours.   BUN 16 8 - 23 mg/dL   Creatinine, Ser 1.06 (H) 0.44 - 1.00 mg/dL   Calcium 10.8 (H) 8.9 - 10.3 mg/dL   Total Protein 7.7 6.5 - 8.1 g/dL   Albumin 4.8 3.5 - 5.0 g/dL   AST 17 15 - 41 U/L   ALT 16 0 - 44 U/L   Alkaline Phosphatase 64 38 - 126 U/L   Total Bilirubin 0.7 0.3 - 1.2 mg/dL   GFR, Estimated 59 (L) >60 mL/min    Comment: (NOTE) Calculated using the CKD-EPI Creatinine Equation (2021)    Anion gap 10 5 - 15    Comment: Performed at [facility] [address], [place], [place] [address]  CBC with Differential     Status: None   Collection Time: 06/11/20  3:34 PM  Result Value Ref Range   WBC 6.5 4.0 - 10.5 K/uL   RBC 4.27 3.87 - 5.11 MIL/uL   Hemoglobin 12.6 12.0 - 15.0 g/dL   HCT 40.0 36.0 - 46.0 %   MCV 93.7 80.0 - 100.0 fL   MCH 29.5 26.0 - 34.0 pg   MCHC 31.5 30.0 - 36.0 g/dL   RDW 12.6 11.5 - 15.5 %   Platelets 336 150 - 400 K/uL   nRBC 0.0 0.0 - 0.2 %   Neutrophils Relative % 67 %   Neutro Abs 4.4 1.7 - 7.7 K/uL   Lymphocytes Relative 24 %   Lymphs Abs 1.6 0.7 - 4.0 K/uL   Monocytes Relative 6 %   Monocytes Absolute 0.4 0.1 - 1.0  K/uL   Eosinophils Relative 1 %   Eosinophils Absolute 0.1 0.0 - 0.5 K/uL   Basophils Relative 1 %   Basophils Absolute 0.1 0.0 - 0.1 K/uL   Immature Granulocytes 1 %   Abs Immature Granulocytes 0.03 0.00 - 0.07 K/uL    Comment: Performed at [facility], [address], [place], [place] [address]  Lactic acid, plasma     Status: None   Collection Time: 06/11/20  3:34 PM  Result Value Ref Range   Lactic Acid, Venous 1.0 0.5 - 1.9 mmol/L    Comment: Performed at [facility]  [address], [place], [place] [address]  Sedimentation rate     Status: None   Collection Time: 06/11/20  6:30 PM  Result Value Ref Range   Sed Rate 19 0 - 22 mm/hr    Comment: Performed at [facility] [address], [place], [place] [address]  C-reactive protein     Status: None   Collection Time: 06/11/20  6:30 PM  Result Value Ref Range   CRP <0.5 <1.0 mg/dL    Comment: Performed at [facility] [address] [place], [place] [address]  HIV Antibody (routine testing w rflx)     Status: None   Collection Time: 06/11/20  6:30 PM  Result Value Ref Range   HIV Screen 4th Generation wRfx Non Reactive Non Reactive    Comment: Performed at [facility], [place] [address], [place], [place] [address]  Lactic acid, plasma     Status: None   Collection Time: 06/11/20  6:32 PM  Result Value Ref Range   Lactic Acid, Venous 0.9 0.5 - 1.9 mmol/L    Comment: Performed at [facility] [address], [place], [place] [address]  MRSA PCR Screening     Status: None   Collection Time: 06/12/20 12:54 AM   Specimen: Nasal Mucosa; Nasopharyngeal  Result Value Ref Range   MRSA by PCR NEGATIVE NEGATIVE    Comment:        The GeneXpert MRSA Assay (FDA approved for NASAL specimens only), is one component of a comprehensive MRSA colonization surveillance program. It is not intended to diagnose MRSA infection nor to guide or monitor treatment for MRSA infections. Performed at [facility], [place] [address], [place], [place] [address]   Resp Panel by RT-PCR (Flu A&B, Covid) Nasopharyngeal Swab     Status: None   Collection Time: 06/12/20  5:34 AM   Specimen: Nasopharyngeal Swab; Nasopharyngeal(NP) swabs in vial transport medium  Result Value Ref Range   SARS Coronavirus 2 by RT PCR NEGATIVE NEGATIVE    Comment: (NOTE) SARS-CoV-2 target nucleic acids are NOT DETECTED.  The SARS-CoV-2 RNA is generally detectable in upper respiratory specimens during the acute phase of infection. The lowest concentration of SARS-CoV-2 viral copies this assay can detect is 138 copies/mL. A negative result does not preclude SARS-Cov-2 infection and should not be used as the sole basis for treatment or other patient management decisions. A negative result may occur with  improper specimen collection/handling, submission of specimen other than nasopharyngeal swab, presence of viral mutation(s) within the areas targeted by this assay, and inadequate number of viral copies(<138 copies/mL). A negative result must be combined with clinical observations, patient history, and epidemiological information. The expected result is Negative.  Fact Sheet for Patients:  EntrepreneurPulse.com.au  Fact Sheet for Healthcare Providers:  IncredibleEmployment.be  This test is no t yet approved or cleared by the [place] FDA and  has been authorized for detection and/or diagnosis of SARS-CoV-2 by FDA under an Emergency Use Authorization (EUA). This EUA will remain  in effect (meaning this test can be used) for the duration of the COVID-19 declaration under Section 564(b)(1) of the Act, 21 U.S.C.section 360bbb-3(b)(1), unless the authorization is terminated  or revoked sooner.       Influenza A by PCR NEGATIVE NEGATIVE   Influenza B by PCR NEGATIVE NEGATIVE    Comment: (NOTE) The Xpert Xpress SARS-CoV-2/FLU/RSV plus assay is intended as an aid in the  diagnosis of influenza  from Nasopharyngeal swab specimens and should not be used as a sole basis for treatment. Nasal washings and aspirates are unacceptable for Xpert Xpress SARS-CoV-2/FLU/RSV testing.  Fact Sheet for Patients: EntrepreneurPulse.com.au  Fact Sheet for Healthcare Providers: IncredibleEmployment.be  This test is not yet approved or cleared by the [place] FDA and has been authorized for detection and/or diagnosis of SARS-CoV-2 by FDA under an Emergency Use Authorization (EUA). This EUA will remain in effect (meaning this test can be used) for the duration of the COVID-19 declaration under Section 564(b)(1) of the Act, 21 U.S.C. section 360bbb-3(b)(1), unless the authorization is terminated or revoked.  Performed at [facility], [place] [address] [place], [place] [address]   Aerobic/Anaerobic Culture (surgical/deep wound)     Status: None (Preliminary result)   Collection Time: 06/12/20  8:49 AM   Specimen: Leg, Left; Wound  Result Value Ref Range   Specimen Description TISSUE    Special Requests LEFT LEG SPEC A    Gram Stain      FEW WBC PRESENT,BOTH PMN AND MONONUCLEAR NO ORGANISMS SEEN    Culture      RARE PSEUDOMONAS AERUGINOSA SUSCEPTIBILITIES TO FOLLOW CRITICAL RESULT CALLED TO, READ BACK BY AND VERIFIED WITH: Dakhari Zuver LATHAM RN @1143  06/13/20 EB Performed at [facility], [address], [place], [place] [address]    Report Status PENDING     MICRO: pseudomonas IMAGING: DG Tibia/Fibula Left Port  Result Date: 06/12/2020 CLINICAL DATA:  Postoperative wound infection. EXAM: PORTABLE LEFT TIBIA AND FIBULA - 2 VIEW COMPARISON:  April 10, 2020. FINDINGS: Similar appearance of tibial intramedullary nail and screws and distal fibular intramedullary rod. No evidence of hardware fracture or loosening. Similar alignment of the distal tibial and fibular fractures without evidence of new acute fracture. No  erosive change to suggest osteomyelitis. Soft tissue swelling. IMPRESSION: Similar distal tibial and fibular fractures and postsurgical change. No change in alignment. No erosive changes to suggest osteomyelitis. Soft tissue swelling. Electronically Signed   By: Margaretha Sheffield MD   On: 06/12/2020 11:02   Assessment/Plan:  65yo F with previous open leg fracture s/p ext fixator converted to ORIF with IMN with most recent wound dehiscence over the last 2 weeks not responding to doxycycline. Found to have GNR deep tissue infection, sans osteomyelitis - recommend to change to cefepime for the time being - await sensitivities to see if can give oral agent and spare the need for iv abtx - plan for 2-3 wk depending on how she is healing - will get sed rate and crp.  Health maintenance = will arrange for her to get pfizer booster tonite.tolerated primary series without difficulty  [facility]uv[facility] [phone]

## 2020-06-13 NOTE — Plan of Care (Signed)

## 2020-06-13 NOTE — Progress Notes (Addendum)
Orthopaedic Trauma Progress Note  SUBJECTIVE: Reports moderate pain about operative site. No chest pain. No SOB. No nausea/vomiting. Notes that oxycodone constipates her. Has not had BM since admission, asking for stool softener/laxative. Will switch to hydrocodone and add Toradol for pain. Intra-op wound cultures growing gram negative rods, susceptibilities pending.  OBJECTIVE:  Vitals:   06/13/20 0726 06/13/20 0837  BP: (!) 157/88 (!) 157/88  Pulse: 70   Resp: 17   Temp: 98.6 F (37 C)   SpO2: 100%     General: Sitting up in bedside chair, NAD Respiratory: No increased work of breathing.  Left lower extremity: Dressing CDI. Incisional vac with good seal/function, no output in canister currently. Tolerates gentle ankle motion. Motor and sensory function intact distally. +DP pulse  IMAGING: Stable post op imaging.   LABS: No results found for this or any previous visit (from the past 24 hour(s)).  ASSESSMENT: Kendra Allison is a 65 y.o. female, 1 Day Post-Op s/p IRRIGATION AND DEBRIDEMENT LEFT LOWER EXTREMITY  CV/Blood loss: Hgb 12.6.  Hemodynamically stable  PLAN: Weightbearing: NWB LLE Incisional and dressing care: Plan to change dressing and remove vac tomorrow  Showering: hold off for now Orthopedic device(s): Wound Vac:LLE  Pain management:  1. Tylenol 325-650 mg q 6 hours PRN 2. Robaxin 500 mg q 6 hours PRN 3. Norco 5-325 mg q 4 hours PRN 4. Toradol 15 mg q 6 hours x 5 doses VTE prophylaxis: Aspirin, SCDs ID: Vancomycin + Ceftriaxone Foley/Lines:  No foley, KVO IVFs Dispo: PT/OT. Have consulted ID for d/c abx. Appreciate their assistance. Plan for d/c in next 24-48 hours   Follow - up plan: 2 weeks  Contact information:  Katha Hamming MD, [name] PA-C. After hours and holidays please check [email or web address] for group call information for [facility]   Christyn Gutkowski A. Ricci Barker, PA-C [phone] (office) [email or web address]

## 2020-06-13 NOTE — TOC Initial Note (Signed)
Transition of Care [facility]) - Initial/Assessment Note    Patient Details  Name: Kendra Allison MRN: [id number] Date of Birth: 05/26/1955  [facility]) CM/SW Contact:    Sharin Mons, RN Phone Number: 06/13/2020, 12:05 PM  Clinical Narrative:    Presents with open tibial shaft/pilon fracture Left leg postoperative infection.     - s/p Irrigation and drainage of left leg postoperative infection,      1/19 NCM spoke with pt @ bedside in regard to discharge planning. Pt is from home alone. PTA states independent with ADL's. DME: rolling walker, BSC, W/C. Pt without family support. Sister lives in [place] and works. States neighbor will sometime assist with taking her to MD apppointments  Pt states can afford Rx meds. States will probably need transportation to home if neighbor not available.  PT/OTevaluation pending .Marland Kitchen...  TOC team will continue to monitor assist with TOC needs....        Expected Discharge Plan: [place] Barriers to Discharge: Continued Medical Work up   Patient Goals and CMS Choice Patient states their goals for this hospitalization and ongoing recovery are:: to get bettet and go home   Choice offered to / list presented to : Patient  Expected Discharge Plan and Services Expected Discharge Plan: [place]   Discharge Planning Services: CM Consult   Living arrangements for the past 2 months: Single Family Home                                      Prior Living Arrangements/Services Living arrangements for the past 2 months: Single Family Home Lives with:: Self Patient language and need for interpreter reviewed:: Yes Do you feel safe going back to the place where you live?: Yes      Need for Family Participation in Patient Care: Yes (Comment) Care giver support system in place?: No (comment) Current home services: DME (rolling walker, BSC, W/C) Criminal Activity/Legal Involvement  Pertinent to Current Situation/Hospitalization: No - Comment as needed  Activities of Daily Living Home Assistive Devices/Equipment: None ADL Screening (condition at time of admission) Patient's cognitive ability adequate to safely complete daily activities?: Yes Is the patient deaf or have difficulty hearing?: No Does the patient have difficulty seeing, even when wearing glasses/contacts?: No Does the patient have difficulty concentrating, remembering, or making decisions?: No Patient able to express need for assistance with ADLs?: Yes Does the patient have difficulty dressing or bathing?: No Independently performs ADLs?: Yes (appropriate for developmental age) Does the patient have difficulty walking or climbing stairs?: Yes Weakness of Legs: Left Weakness of Arms/Hands: None  Permission Sought/Granted   Permission granted to share information with : Yes, Verbal Permission Granted  Share Information with NAME: Donald Siva (Sister)  [phone], [place], [place]           Emotional Assessment Appearance:: Appears stated age Attitude/Demeanor/Rapport: Gracious Affect (typically observed): Accepting Orientation: : Oriented to Self,Oriented to Place,Oriented to  Time,Oriented to Situation Alcohol / Substance Use: Not Applicable Psych Involvement: No (comment)  Admission diagnosis:  Surgical site infection [T81.49XA] Patient Active Problem List   Diagnosis Date Noted  . Postoperative wound infection 06/11/2020  . Surgical site infection 06/11/2020  . Asthma   . GERD (gastroesophageal reflux disease)   . Hypertension   . Fracture of tibia and fibula, shaft, left, open type III, initial encounter 04/10/2020  .  Displaced comminuted fracture of shaft of left fibula, initial encounter for closed fracture 04/10/2020  . Fall from height of greater than 3 feet 04/10/2020  . Open displaced pilon fracture of left tibia 04/07/2020  . Hyperlipidemia   . 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 Essential hypertension 03/01/2019   PCP:  Olin Hauser, DO Pharmacy:   [facility], [place] AT [place] [address] Phone: [phone] Fax: [phone]  [facility] [address] [place] [address] Phone: [phone] Fax: [place], [place] [address] [place] [address] Phone: [phone] Fax: [phone]     Social Determinants of Health (SDOH) Interventions    Readmission Risk Interventions No flowsheet data found.

## 2020-06-13 NOTE — Progress Notes (Signed)
Pharmacy Antibiotic Note  Kendra Allison is a 65 y.o. female admitted on 06/11/2020 with leg pain/swelling/drainage from prior surgical site.  Pharmacy has been consulted for cefepime dosing. Patient initially started on ceftriaxone and vancomycin but wound culture growing pseudomonas. Formal ID consult to follow.  Per patient's renal function generally CrCl ~60 ml/min, will initiate cefepime 2g q8hr and follow-up for any changes.  Plan: Discontinue vancomycin and ceftriaxone Initiate cefepime 2g q8hr Follow-up renal function, cultures, and ID recommendations  Height: 5\' 6"  (167.6 cm) Weight: 80.7 kg (178 lb) IBW/kg (Calculated) : 59.3  Temp (24hrs), Avg:98.6 F (37 C), Min:98.4 F (36.9 C), Max:98.7 F (37.1 C)  Recent Labs  Lab 06/11/20 1534 06/11/20 1832  WBC 6.5  --   CREATININE 1.06*  --   LATICACIDVEN 1.0 0.9    Estimated Creatinine Clearance: 57.5 mL/min (A) (by C-G formula based on SCr of 1.06 mg/dL (H)).    No Known Allergies   Thank you for this consult,  Alfonse Spruce, PharmD PGY2 ID Pharmacy Resident [phone]  06/13/2020 2:10 PM

## 2020-06-14 ENCOUNTER — Telehealth: Payer: Self-pay

## 2020-06-14 DIAGNOSIS — B9689 Other specified bacterial agents as the cause of diseases classified elsewhere: Secondary | ICD-10-CM

## 2020-06-14 DIAGNOSIS — T8149XA Infection following a procedure, other surgical site, initial encounter: Secondary | ICD-10-CM | POA: Diagnosis not present

## 2020-06-14 LAB — CBC
HCT: 34.2 % — ABNORMAL LOW (ref 36.0–46.0)
Hemoglobin: 10.9 g/dL — ABNORMAL LOW (ref 12.0–15.0)
MCH: 29.5 pg (ref 26.0–34.0)
MCHC: 31.9 g/dL (ref 30.0–36.0)
MCV: 92.7 fL (ref 80.0–100.0)
Platelets: 290 10*3/uL (ref 150–400)
RBC: 3.69 MIL/uL — ABNORMAL LOW (ref 3.87–5.11)
RDW: 12.7 % (ref 11.5–15.5)
WBC: 6.1 10*3/uL (ref 4.0–10.5)
nRBC: 0 % (ref 0.0–0.2)

## 2020-06-14 LAB — C-REACTIVE PROTEIN: CRP: 0.6 mg/dL (ref <1.0)

## 2020-06-14 LAB — SEDIMENTATION RATE: Sed Rate: 11 mm/hr (ref 0–22)

## 2020-06-14 MED ORDER — KETOROLAC TROMETHAMINE 10 MG PO TABS: 10.0000 mg | ORAL_TABLET | Freq: Four times a day (QID) | ORAL | 0 refills | Status: DC | PRN

## 2020-06-14 MED ORDER — ASPIRIN 325 MG PO TABS: 325.0000 mg | ORAL_TABLET | Freq: Every day | ORAL | 0 refills | Status: DC

## 2020-06-14 MED ORDER — LEVOFLOXACIN 500 MG PO TABS
750.0000 mg | ORAL_TABLET | Freq: Every day | ORAL | Status: DC
  Administered 2020-06-14 – 2020-06-15 (×2): 750 mg via ORAL
  Filled 2020-06-14 (×2): qty 2

## 2020-06-14 MED ORDER — LEVOFLOXACIN 750 MG PO TABS: 750.0000 mg | ORAL_TABLET | Freq: Every day | ORAL | 0 refills | Status: AC

## 2020-06-14 NOTE — Progress Notes (Signed)
Orthopedic Tech Progress Note Patient Details:  Kendra Allison 1955/07/16 [id number] Called in order to HANGER or a NIGHT SPLINT for patient  Patient ID: [name], female   DOB: Nov 27, 1955, 65 y.o.   MRN: [id number]   Janit Pagan 06/14/2020, 5:33 PM

## 2020-06-14 NOTE — Evaluation (Signed)
Occupational Therapy Evaluation and Discharge Patient Details Name: Kendra Allison MRN: [id number] DOB: 01/22/1956 Today's Date: 06/14/2020    History of Present Illness Pt is a 65 y/o female admitted secondary to post op wound infection. Pt is s/p I and D. Pt with recently s/p external fixation and transitioned to IM nail. PMH includes HTN and asthma.   Clinical Impression   This 65 yo female admitted and underwent above presents to acute OT at a Mod I level, no further OT needs--we will sign off.    Follow Up Recommendations  No OT follow up;Supervision - Intermittent    Equipment Recommendations  None recommended by OT       Precautions / Restrictions Precautions Precautions: Fall Restrictions Weight Bearing Restrictions: Yes LLE Weight Bearing: Non weight bearing      Mobility Bed Mobility Overal bed mobility: Modified Independent             General bed mobility comments: Up ambulating wtih PT when I entered room    Transfers Overall transfer level: Modified independent                    Balance Overall balance assessment: No apparent balance deficits (not formally assessed)                                         ADL either performed or assessed with clinical judgement   ADL Overall ADL's : Modified independent                                             Vision Patient Visual Report: No change from baseline              Pertinent Vitals/Pain Pain Assessment: Faces Faces Pain Scale: Hurts even more Pain Location: L ankle Pain Descriptors / Indicators: Aching Pain Intervention(s): Limited activity within patient's tolerance;Monitored during session;Repositioned;Ice applied     Hand Dominance  right   Extremity/Trunk Assessment Upper Extremity Assessment Upper Extremity Assessment: Overall WFL for tasks assessed     Communication Communication Communication: No difficulties   Cognition  Arousal/Alertness: Awake/alert Behavior During Therapy: WFL for tasks assessed/performed Overall Cognitive Status: Within Functional Limits for tasks assessed                                                Home Living Family/patient expects to be discharged to:: Private residence Living Arrangements: Alone Available Help at Discharge: Family;Available PRN/intermittently Type of Home: House Home Access: Level entry     Home Layout: One level     Bathroom Shower/Tub: Occupational psychologist: Standard     Home Equipment: Shower seat - built in;Walker - 2 wheels          Prior Functioning/Environment Level of Independence: Independent with assistive device(s)        Comments: Has been using RW since surgery. mod I with ADLs.        OT Problem List: Pain         OT Goals(Current goals can be found in the care plan section) Acute Rehab OT Goals Patient Stated Goal: to go  home                AM-PAC OT "6 Clicks" Daily Activity     Outcome Measure Help from another person eating meals?: None Help from another person taking care of personal grooming?: None Help from another person toileting, which includes using toliet, bedpan, or urinal?: None Help from another person bathing (including washing, rinsing, drying)?: None Help from another person to put on and taking off regular upper body clothing?: None Help from another person to put on and taking off regular lower body clothing?: None 6 Click Score: 24   End of Session Equipment Utilized During Treatment: Gait belt;Rolling walker  Activity Tolerance: Patient tolerated treatment well Patient left: in chair;with call bell/phone within reach  OT Visit Diagnosis: Pain Pain - Right/Left: Left Pain - part of body: Ankle and joints of foot;Leg                Time: 8101-7510 OT Time Calculation (min): 12 min Charges:  OT General Charges $OT Visit: 1 Visit OT Evaluation $OT Eval Low  Complexity: 1 Low  [address], OTR/L Acute [employer] Pager [phone] Office [phone]     Almon Register 06/14/2020, 4:04 PM

## 2020-06-14 NOTE — Progress Notes (Signed)
OT Cancellation Note  Patient Details Name: Kendra Allison MRN: [id number] DOB: 1955-05-29   Cancelled Treatment:    Reason Eval/Treat Not Completed: Other (comment) Pt in process of getting wound looked at and wound vac removed. Will check back a little later.   Almon Register 06/14/2020, 12:03 PM

## 2020-06-14 NOTE — Progress Notes (Signed)
Orthopaedic Trauma Progress Note  SUBJECTIVE: Pain improving, managed currently with toradol and tylenol. No chest pain. No SOB. No nausea/vomiting. Intra-op wound cultures growing pseudomonas.   OBJECTIVE:  Vitals:   06/14/20 0811 06/14/20 1556  BP: (!) 129/91 (!) 144/93  Pulse: 66 71  Resp: 17 17  Temp: 98.3 F (36.8 C) 97.8 F (36.6 C)  SpO2: 100% 98%    General: Sitting up in bed, NAD Respiratory: No increased work of breathing.  Left lower extremity: Dressing changed, incisional vac removed with no output in canister. incision CDI. Tolerates gentle ankle motion. Motor and sensory function intact distally. +DP pulse  IMAGING: Stable post op imaging.   LABS:  Results for orders placed or performed during the hospital encounter of 06/11/20 (from the past 24 hour(s))  CBC     Status: Abnormal   Collection Time: 06/14/20  2:05 AM  Result Value Ref Range   WBC 6.1 4.0 - 10.5 K/uL   RBC 3.69 (L) 3.87 - 5.11 MIL/uL   Hemoglobin 10.9 (L) 12.0 - 15.0 g/dL   HCT 34.2 (L) 36.0 - 46.0 %   MCV 92.7 80.0 - 100.0 fL   MCH 29.5 26.0 - 34.0 pg   MCHC 31.9 30.0 - 36.0 g/dL   RDW 12.7 11.5 - 15.5 %   Platelets 290 150 - 400 K/uL   nRBC 0.0 0.0 - 0.2 %  Sedimentation rate     Status: None   Collection Time: 06/14/20  2:05 AM  Result Value Ref Range   Sed Rate 11 0 - 22 mm/hr  C-reactive protein     Status: None   Collection Time: 06/14/20  2:05 AM  Result Value Ref Range   CRP 0.6 <1.0 mg/dL    ASSESSMENT: Kendra Allison is a 65 y.o. female, 2 Days Post-Op s/p IRRIGATION AND DEBRIDEMENT LEFT LOWER EXTREMITY  CV/Blood loss: ABLA, 10.9 this morning.  Hemodynamically stable  PLAN: Weightbearing: NWB LLE Incisional and dressing care: changed today, continue to change PRN Showering: ok to shower, keep wound covered Orthopedic device(s): None  Pain management:  1. Tylenol 325-650 mg q 6 hours PRN 2. Robaxin 500 mg q 6 hours PRN 4. Toradol 15 mg q 6 hours x 5 doses VTE  prophylaxis: Aspirin, SCDs ID: Cefepime Foley/Lines:  No foley, KVO IVFs Dispo: Therapies as tolerated. Plan for d/c tomorrow, patient's sister will pick her up.  Appreciate assistance from ID team, plan to treat with levofloxacin 750mg  po daily x 3 weeks  Follow - up plan: 2 weeks  With Dr. Doreatha Martin, 3 weeks with Dr. Lequita Asal information:  Katha Hamming MD, [name] PA-C. After hours and holidays please check [email or web address] for group call information for [facility]   Hartwell Vandiver A. Ricci Barker, PA-C [phone] (office) [email or web address]

## 2020-06-14 NOTE — Discharge Instructions (Signed)
Orthopaedic Trauma Service Discharge Instructions   General Discharge Instructions  WEIGHT BEARING STATUS: Non-weightbearing left leg  RANGE OF MOTION/ACTIVITY: Ok for gentle ankle motion as tolerated  Wound Care: You may remove surgical dressing on Sunday 06/16/20. Incisions can be left open to air if there is no drainage. If incision continues to have drainage, follow wound care instructions below. Okay to shower if no drainage from incisions.  DVT/PE prophylaxis: Aspirin  Diet: as you were eating previously.  Can use over the counter stool softeners and bowel preparations, such as Miralax, to help with bowel movements.  Narcotics can be constipating.  Be sure to drink plenty of fluids  PAIN MEDICATION USE AND EXPECTATIONS  You have likely been given narcotic medications to help control your pain.  After a traumatic event that results in an fracture (broken bone) with or without surgery, it is ok to use narcotic pain medications to help control one's pain.  We understand that everyone responds to pain differently and each individual patient will be evaluated on a regular basis for the continued need for narcotic medications. Ideally, narcotic medication use should last no more than 6-8 weeks (coinciding with fracture healing).   As a patient it is your responsibility as well to monitor narcotic medication use and report the amount and frequency you use these medications when you come to your office visit.   We would also advise that if you are using narcotic medications, you should take a dose prior to therapy to maximize you participation.  IF YOU ARE ON NARCOTIC MEDICATIONS IT IS NOT PERMISSIBLE TO OPERATE A MOTOR VEHICLE (MOTORCYCLE/CAR/TRUCK/MOPED) OR HEAVY MACHINERY DO NOT MIX NARCOTICS WITH OTHER CNS (CENTRAL NERVOUS SYSTEM) DEPRESSANTS SUCH AS ALCOHOL   STOP SMOKING OR USING NICOTINE PRODUCTS!!!!  As discussed nicotine severely impairs your body's ability to heal surgical and  traumatic wounds but also impairs bone healing.  Wounds and bone heal by forming microscopic blood vessels (angiogenesis) and nicotine is a vasoconstrictor (essentially, shrinks blood vessels).  Therefore, if vasoconstriction occurs to these microscopic blood vessels they essentially disappear and are unable to deliver necessary nutrients to the healing tissue.  This is one modifiable factor that you can do to dramatically increase your chances of healing your injury.    (This means no smoking, no nicotine gum, patches, etc)  DO NOT USE NONSTEROIDAL ANTI-INFLAMMATORY DRUGS (NSAID'S)  Using products such as Advil (ibuprofen), Aleve (naproxen), Motrin (ibuprofen) for additional pain control during fracture healing can delay and/or prevent the healing response.  If you would like to take over the counter (OTC) medication, Tylenol (acetaminophen) is ok.  However, some narcotic medications that are given for pain control contain acetaminophen as well. Therefore, you should not exceed more than 4000 mg of tylenol in a day if you do not have liver disease.  Also note that there are may OTC medicines, such as cold medicines and allergy medicines that my contain tylenol as well.  If you have any questions about medications and/or interactions please ask your doctor/PA or your pharmacist.      ICE AND ELEVATE INJURED/OPERATIVE EXTREMITY  Using ice and elevating the injured extremity above your heart can help with swelling and pain control.  Icing in a pulsatile fashion, such as 20 minutes on and 20 minutes off, can be followed.    Do not place ice directly on skin. Make sure there is a barrier between to skin and the ice pack.    Using frozen items such  as frozen peas works well as the conform nicely to the are that needs to be iced.  USE AN ACE WRAP OR TED HOSE FOR SWELLING CONTROL  In addition to icing and elevation, Ace wraps or TED hose are used to help limit and resolve swelling.  It is recommended to use  Ace wraps or TED hose until you are informed to stop.    When using Ace Wraps start the wrapping distally (farthest away from the body) and wrap proximally (closer to the body)   Example: If you had surgery on your leg or thing and you do not have a splint on, start the ace wrap at the toes and work your way up to the thigh        If you had surgery on your upper extremity and do not have a splint on, start the ace wrap at your fingers and work your way up to the upper arm    [place]: [id number]   VISIT OUR WEBSITE FOR ADDITIONAL INFORMATION: [email or web address]    Discharge Wound Care Instructions  Do NOT apply any ointments, solutions or lotions to pin sites or surgical wounds.  These prevent needed drainage and even though solutions like hydrogen peroxide kill bacteria, they also damage cells lining the pin sites that help fight infection.  Applying lotions or ointments can keep the wounds moist and can cause them to breakdown and open up as well. This can increase the risk for infection. When in doubt call the office.  Surgical incisions should be dressed daily.  If any drainage is noted, use one layer of adaptic, then gauze, Kerlix, and an ace wrap.  Once the incision is completely dry and without drainage, it may be left open to air out.  Showering may begin 36-48 hours later.  Cleaning gently with soap and water.  Traumatic wounds should be dressed daily as well.    One layer of adaptic, gauze, Kerlix, then ace wrap.  The adaptic can be discontinued once the draining has ceased    If you have a wet to dry dressing: wet the gauze with saline the squeeze as much saline out so the gauze is moist (not soaking wet), place moistened gauze over wound, then place a dry gauze over the moist one, followed by Kerlix wrap, then ace wrap.

## 2020-06-14 NOTE — Evaluation (Addendum)
Physical Therapy Evaluation and Discharge Patient Details Name: Kendra Allison MRN: [id number] DOB: 14-Jan-1956 Today's Date: 06/14/2020   History of Present Illness  Pt is a 65 y/o female admitted secondary to post op wound infection. Pt is s/p I and D. Pt with recently s/p external fixation and transitioned to IM nail. PMH includes HTN and asthma.  Clinical Impression  Patient evaluated by Physical Therapy with no further acute PT needs identified. All education has been completed and the patient has no further questions. Pt overall steady with mobility using RW. Able to maintain NWB precautions on LLE and required no physical assist. Pt reports she is used to precautions as she was having to maintain previous to admission. Feel she would benefit from outpatient PT once cleared by MD. Otherwise, no further skilled PT intervention needed acutely. See below for any follow-up Physical Therapy or equipment needs. PT is signing off. Thank you for this referral. If needs change, please re-consult.      Follow Up Recommendations Other (comment) (outpatient once cleared by MD)    Equipment Recommendations  None recommended by PT    Recommendations for Other Services       Precautions / Restrictions Precautions Precautions: Fall Restrictions Weight Bearing Restrictions: Yes LLE Weight Bearing: Non weight bearing      Mobility  Bed Mobility Overal bed mobility: Modified Independent                  Transfers Overall transfer level: Modified independent                  Ambulation/Gait Ambulation/Gait assistance: Supervision Gait Distance (Feet): 100 Feet Assistive device: Rolling walker (2 wheeled) Gait Pattern/deviations: Step-to pattern Gait velocity: Decreased   General Gait Details: Hop to gait pattern. Able to perform horizontal head turns without LOB. Maintained NWB on LLE. Supervision for safety.  Stairs            Wheelchair Mobility    Modified  Rankin (Stroke Patients Only)       Balance Overall balance assessment: No apparent balance deficits (not formally assessed)                                           Pertinent Vitals/Pain Pain Assessment: Faces Faces Pain Scale: Hurts even more Pain Location: L ankle Pain Descriptors / Indicators: Aching Pain Intervention(s): Limited activity within patient's tolerance;Monitored during session;Repositioned    Home Living Family/patient expects to be discharged to:: Private residence Living Arrangements: Alone Available Help at Discharge: Family;Available PRN/intermittently Type of Home: House Home Access: Level entry     Home Layout: One level Home Equipment: Shower seat - built in;Walker - 2 wheels      Prior Function Level of Independence: Independent with assistive device(s)         Comments: Has been using RW since surgery. mod I with ADLs.     Hand Dominance        Extremity/Trunk Assessment   Upper Extremity Assessment Upper Extremity Assessment: Defer to OT evaluation    Lower Extremity Assessment Lower Extremity Assessment: LLE deficits/detail LLE Deficits / Details: Deficits consistent with post op pain and weakness.    Cervical / Trunk Assessment Cervical / Trunk Assessment: Normal  Communication   Communication: No difficulties  Cognition Arousal/Alertness: Awake/alert Behavior During Therapy: WFL for tasks assessed/performed Overall Cognitive Status: Within Functional Limits for  tasks assessed                                        General Comments      Exercises     Assessment/Plan    PT Assessment Patent does not need any further PT services  PT Problem List         PT Treatment Interventions      PT Goals (Current goals can be found in the Care Plan section)  Acute Rehab PT Goals Patient Stated Goal: to go home PT Goal Formulation: With patient Time For Goal Achievement:  06/14/20 Potential to Achieve Goals: Good    Frequency     Barriers to discharge        Co-evaluation               AM-PAC PT "6 Clicks" Mobility  Outcome Measure Help needed turning from your back to your side while in a flat bed without using bedrails?: None Help needed moving from lying on your back to sitting on the side of a flat bed without using bedrails?: None Help needed moving to and from a bed to a chair (including a wheelchair)?: None Help needed standing up from a chair using your arms (e.g., wheelchair or bedside chair)?: None Help needed to walk in hospital room?: None Help needed climbing 3-5 steps with a railing? : A Little 6 Click Score: 23    End of Session Equipment Utilized During Treatment: Gait belt Activity Tolerance: Patient tolerated treatment well Patient left: in chair;with call bell/phone within reach;Other (comment) (with OT present) Nurse Communication: Mobility status PT Visit Diagnosis: Other abnormalities of gait and mobility (R26.89)    Time: 4492-0100 PT Time Calculation (min) (ACUTE ONLY): 16 min   Charges:   PT Evaluation $PT Eval Low Complexity: 1 Low          Lou Miner, DPT  Acute Rehabilitation Services  Pager: [phone] Office: [phone]   Rudean Hitt 06/14/2020, 1:19 PM

## 2020-06-14 NOTE — Plan of Care (Signed)

## 2020-06-14 NOTE — Telephone Encounter (Signed)
Patient scheduled for hospital follow up on 07/02/20 with Dr. Baxter Flattery.   Beryle Flock, RN

## 2020-06-14 NOTE — Progress Notes (Signed)
[name] for Infectious Disease    Date of Admission:  06/11/2020   Total days of antibiotics 3/day 1 cefepime           ID: Kendra Allison is a 65 y.o. female with  Principal Problem:   Postoperative wound infection Active Problems:   Surgical site infection    Subjective: She reports that her left forearm near piv is painful. Swelling is noted in that area as well.  Otherwise, tolerating her iv abtx, mild foot discomfort  ROS: other than left arm pain, 12 point ros is negative  Medications:   amLODipine  10 mg Oral Daily   aspirin  325 mg Oral Daily   docusate sodium  100 mg Oral BID   ketorolac  15 mg Intravenous Q6H   loratadine  10 mg Oral Daily   losartan  50 mg Oral Daily   polyethylene glycol  17 g Oral Daily   rosuvastatin  10 mg Oral Daily    Objective: Vital signs in last 24 hours: Temp:  [98.3 F (36.8 C)-98.6 F (37 C)] 98.3 F (36.8 C) (01/21 0811) Pulse Rate:  [62-72] 66 (01/21 0811) Resp:  [15-17] 17 (01/21 0811) BP: (129-149)/(83-91) 129/91 (01/21 0811) SpO2:  [100 %] 100 % (01/21 4627) Physical Exam  Constitutional: He is oriented to person, place, and time. He appears well-developed and well-nourished. No distress.  HENT:  Mouth/Throat: Oropharynx is clear and moist. No oropharyngeal exudate.  Cardiovascular: Normal rate, regular rhythm and normal heart sounds. Exam reveals no gallop and no friction rub.  No murmur heard.  Pulmonary/Chest: Effort normal and breath sounds normal. No respiratory distress. He has no wheezes.  Abdominal: Soft. Bowel sounds are normal. He exhibits no distension. There is no tenderness.  Ext: lef tfoot with wound vac. Left fore arm, some induration proximal to piv (suspect it is extravagation of ivf)  Neurological: He is alert and oriented to person, place, and time.  Skin: Skin is warm and dry. No rash noted. No erythema.  Psychiatric: He has a normal mood and affect. His behavior is normal.      Lab Results Recent Labs    06/11/20 1534 06/14/20 0205  WBC 6.5 6.1  HGB 12.6 10.9*  HCT 40.0 34.2*  NA 138  --   K 3.8  --   CL 103  --   CO2 25  --   BUN 16  --   CREATININE 1.06*  --    Liver Panel Recent Labs    06/11/20 1534  PROT 7.7  ALBUMIN 4.8  AST 17  ALT 16  ALKPHOS 64  BILITOT 0.7   Sedimentation Rate Recent Labs    06/14/20 0205  ESRSEDRATE 11   C-Reactive Protein Recent Labs    06/11/20 1830 06/14/20 0205  CRP <0.5 0.6    Microbiology: PSA (FQ S) Studies/Results: No results found.   Assessment/Plan: 65yo F with previous open leg fracture s/p ext fixator converted to ORIF with IMN with most recent wound dehiscence over the last 2 weeks not responding to doxycycline. Found to have GNR deep tissue infection, sans osteomyelitis  - plan to treat with levofloxacin 750mg  po daily x 3 weeks including yesterday's dose of IV cefepime. We will follow up with her in 3 wk in the clinic to see if any further extension of antibiotics depending on wound healing  PIV infiltration = this has been removed. Recommend warm compresses to help decrease localized swelling to left fore arm  Will sign off.  [facility] for Infectious Diseases Cell: [phone] Pager: [phone]  06/14/2020, 12:19 PM

## 2020-06-14 NOTE — Telephone Encounter (Signed)
-----   Message from Carlyle Basques, MD sent at 06/14/2020  4:57 PM EST ----- Can I see back in 2-3 wk

## 2020-06-15 NOTE — Progress Notes (Signed)
OrthopaedicProgress Note  SUBJECTIVE: Pain improving, managed currently with toradol and tylenol. No chest pain. No SOB. No nausea/vomiting. Intra-op wound cultures growing pseudomonas.   OBJECTIVE:  Vitals:   06/15/20 0345 06/15/20 0819  BP: (!) 141/89 134/86  Pulse: 68 72  Resp: 16 16  Temp: 98.5 F (36.9 C) 98.8 F (37.1 C)  SpO2: 99% 98%    General: Sitting up in bed, NAD Respiratory: No increased work of breathing.  Left lower extremity: Dressing changed, incisional vac removed with no output in canister. incision CDI. Tolerates gentle ankle motion. Motor and sensory function intact distally. +DP pulse  IMAGING: Stable post op imaging.   LABS:  No results found for this or any previous visit (from the past 24 hour(s)).  ASSESSMENT: Kendra Allison is a 65 y.o. female, 3 Days Post-Op s/p IRRIGATION AND DEBRIDEMENT LEFT LOWER EXTREMITY  CV/Blood loss: ABLA, 10.9 this morning.  Hemodynamically stable  PLAN: Weightbearing: NWB LLE Incisional and dressing care: changed today, continue to change PRN Showering: ok to shower, keep wound covered Orthopedic device(s): None  Pain management:  1. Tylenol 325-650 mg q 6 hours PRN 2. Robaxin 500 mg q 6 hours PRN 4. Toradol 15 mg q 6 hours x 5 doses VTE prophylaxis: Aspirin, SCDs ID: Cefepime Foley/Lines:  No foley, KVO IVFs Dispo: Therapies as tolerated. Plan for d/c today, patient's sister will pick her up.  Appreciate assistance from ID team, plan to treat with levofloxacin 750mg  po daily x 3 weeks  Follow - up plan: 2 weeks  With Dr. Doreatha Allison, 3 weeks with Dr. Lequita Allison information:  Kendra Hamming MD, Kendra Pace PA-C. After hours and holidays please check [email or web address] for group call information for [facility]   Kendra A. Ricci Barker, PA-C [phone] (office) [email or web address]

## 2020-06-15 NOTE — TOC Transition Note (Signed)
Transition of Care [facility]) - CM/SW Discharge Note   Patient Details  Name: Kendra Allison MRN: [id number] Date of Birth: 04/06/1956  [facility] of [facility] CM/SW Contact:  Konrad Penta, RN Phone Number: [phone] 06/15/2020, 1:24 PM   Clinical Narrative:   Spoke with [name] who reports her sister will pick her up today. She does not have any transport needs to get home. Home health therapy was not recommended.  No further needs assessed.       Barriers to Discharge: Continued Medical Work up   Patient Goals and CMS Choice Patient states their goals for this hospitalization and ongoing recovery are:: to get bettet and go home   Choice offered to / list presented to : Patient  Discharge Placement                       Discharge Plan and Services   Discharge Planning Services: CM Consult                                 Social Determinants of Health (SDOH) Interventions     Readmission Risk Interventions No flowsheet data found.    Marthenia Rolling, MSN, RN,BSN [facility] Case Manager [phone]

## 2020-06-16 NOTE — Discharge Summary (Signed)
Orthopaedic Trauma Service (OTS) Discharge Summary   Patient ID: Kendra Allison MRN: [id number] DOB/AGE: [birth year] 64 y.o.  Admit date: 06/11/2020 Discharge date: 06/15/2020  Admission Diagnoses: Postoperative wound infection left leg  Discharge Diagnoses:  Principal Problem:   Postoperative wound infection Active Problems:   Surgical site infection   Past Medical History:  Diagnosis Date  . Asthma   . GERD (gastroesophageal reflux disease)   . Hypercholesteremia   . Hyperlipidemia   . Hypertension   . Osteoporosis      Procedures Performed: 1. CPT 10180-Irrigation and drainage of left leg postoperative infection  Discharged Condition: good  Hospital Course: Patient was directly admitted to [facility] on 06/11/2020 with postoperative wound infection of left leg.  She was taken to the operating room by Dr. Doreatha Martin on 06/12/2020 for the above procedure.  She tolerated this well without complications.  Intraoperative wound cultures were obtained which showed to be growing Pseudomonas aeruginosa on postoperative day #2.  Infectious disease team was consulted for assistance with determining appropriate discharge antibiotics.  Patient began working with physical and occupational therapy starting on postoperative day #1.  She was started on aspirin for DVT prophylaxis starting on postoperative day #1.  Incisional wound VAC was removed on postoperative day #2 and incision was clean, dry, intact with no drainage and no other signs of continued infection.  The remainder of the patient's hospitalization was dedicated to pain control and allowing patient to receive appropriate amount of IV antibiotics prior to discharge. On 06/15/2020, the patient was tolerating diet, working well with therapies, pain well controlled, vital signs stable, dressings clean, dry, intact and felt stable for discharge to  home. Patient will follow up as below and knows to call with questions or  concerns.     Consults: ID  Significant Diagnostic Studies:  Results for orders placed or performed during the hospital encounter of 06/11/20 (from the past 168 hour(s))  Comprehensive metabolic panel   Collection Time: 06/11/20  3:34 PM  Result Value Ref Range   Sodium 138 135 - 145 mmol/L   Potassium 3.8 3.5 - 5.1 mmol/L   Chloride 103 98 - 111 mmol/L   CO2 25 22 - 32 mmol/L   Glucose, Bld 110 (H) 70 - 99 mg/dL   BUN 16 8 - 23 mg/dL   Creatinine, Ser 1.06 (H) 0.44 - 1.00 mg/dL   Calcium 10.8 (H) 8.9 - 10.3 mg/dL   Total Protein 7.7 6.5 - 8.1 g/dL   Albumin 4.8 3.5 - 5.0 g/dL   AST 17 15 - 41 U/L   ALT 16 0 - 44 U/L   Alkaline Phosphatase 64 38 - 126 U/L   Total Bilirubin 0.7 0.3 - 1.2 mg/dL   GFR, Estimated 59 (L) >60 mL/min   Anion gap 10 5 - 15  CBC with Differential   Collection Time: 06/11/20  3:34 PM  Result Value Ref Range   WBC 6.5 4.0 - 10.5 K/uL   RBC 4.27 3.87 - 5.11 MIL/uL   Hemoglobin 12.6 12.0 - 15.0 g/dL   HCT 40.0 36.0 - 46.0 %   MCV 93.7 80.0 - 100.0 fL   MCH 29.5 26.0 - 34.0 pg   MCHC 31.5 30.0 - 36.0 g/dL   RDW 12.6 11.5 - 15.5 %   Platelets 336 150 - 400 K/uL   nRBC 0.0 0.0 - 0.2 %   Neutrophils Relative % 67 %   Neutro Abs 4.4 1.7 - 7.7 K/uL  Lymphocytes Relative 24 %   Lymphs Abs 1.6 0.7 - 4.0 K/uL   Monocytes Relative 6 %   Monocytes Absolute 0.4 0.1 - 1.0 K/uL   Eosinophils Relative 1 %   Eosinophils Absolute 0.1 0.0 - 0.5 K/uL   Basophils Relative 1 %   Basophils Absolute 0.1 0.0 - 0.1 K/uL   Immature Granulocytes 1 %   Abs Immature Granulocytes 0.03 0.00 - 0.07 K/uL  Lactic acid, plasma   Collection Time: 06/11/20  3:34 PM  Result Value Ref Range   Lactic Acid, Venous 1.0 0.5 - 1.9 mmol/L  Sedimentation rate   Collection Time: 06/11/20  6:30 PM  Result Value Ref Range   Sed Rate 19 0 - 22 mm/hr  C-reactive protein   Collection Time: 06/11/20  6:30 PM  Result Value Ref Range   CRP <0.5 <1.0 mg/dL  HIV Antibody (routine testing  w rflx)   Collection Time: 06/11/20  6:30 PM  Result Value Ref Range   HIV Screen 4th Generation wRfx Non Reactive Non Reactive  Lactic acid, plasma   Collection Time: 06/11/20  6:32 PM  Result Value Ref Range   Lactic Acid, Venous 0.9 0.5 - 1.9 mmol/L  MRSA PCR Screening   Collection Time: 06/12/20 12:54 AM   Specimen: Nasal Mucosa; Nasopharyngeal  Result Value Ref Range   MRSA by PCR NEGATIVE NEGATIVE  Resp Panel by RT-PCR (Flu A&B, Covid) Nasopharyngeal Swab   Collection Time: 06/12/20  5:34 AM   Specimen: Nasopharyngeal Swab; Nasopharyngeal(NP) swabs in vial transport medium  Result Value Ref Range   SARS Coronavirus 2 by RT PCR NEGATIVE NEGATIVE   Influenza A by PCR NEGATIVE NEGATIVE   Influenza B by PCR NEGATIVE NEGATIVE  Aerobic/Anaerobic Culture (surgical/deep wound)   Collection Time: 06/12/20  8:49 AM   Specimen: Leg, Left; Wound  Result Value Ref Range   Specimen Description TISSUE    Special Requests LEFT LEG SPEC A    Gram Stain      FEW WBC PRESENT,BOTH PMN AND MONONUCLEAR NO ORGANISMS SEEN Performed at [facility], [address], [place], [place] [address]    Culture      RARE PSEUDOMONAS AERUGINOSA CRITICAL RESULT CALLED TO, READ BACK BY AND VERIFIED WITH: CYNTHIA LATHAM RN @1143  06/13/20 EB NO ANAEROBES ISOLATED; CULTURE IN PROGRESS FOR 5 DAYS    Report Status PENDING    Organism ID, Bacteria PSEUDOMONAS AERUGINOSA       Susceptibility   Pseudomonas aeruginosa - MIC*    CEFTAZIDIME 4 SENSITIVE Sensitive     CIPROFLOXACIN <=0.25 SENSITIVE Sensitive     GENTAMICIN <=1 SENSITIVE Sensitive     IMIPENEM 1 SENSITIVE Sensitive     PIP/TAZO <=4 SENSITIVE Sensitive     CEFEPIME 2 SENSITIVE Sensitive     * RARE PSEUDOMONAS AERUGINOSA  CBC   Collection Time: 06/14/20  2:05 AM  Result Value Ref Range   WBC 6.1 4.0 - 10.5 K/uL   RBC 3.69 (L) 3.87 - 5.11 MIL/uL   Hemoglobin 10.9 (L) 12.0 - 15.0 g/dL   HCT 34.2 (L) 36.0 - 46.0 %   MCV 92.7 80.0 - 100.0  fL   MCH 29.5 26.0 - 34.0 pg   MCHC 31.9 30.0 - 36.0 g/dL   RDW 12.7 11.5 - 15.5 %   Platelets 290 150 - 400 K/uL   nRBC 0.0 0.0 - 0.2 %  Sedimentation rate   Collection Time: 06/14/20  2:05 AM  Result Value Ref Range  Sed Rate 11 0 - 22 mm/hr  C-reactive protein   Collection Time: 06/14/20  2:05 AM  Result Value Ref Range   CRP 0.6 <1.0 mg/dL     Treatments: IV hydration, antibiotics: vancomycin, ceftriaxone and cefepime, analgesia: acetaminophen and Toradol, anticoagulation: ASA, therapies: PT and OT and surgery: As above  Discharge Exam: General: Sitting up in bed, NAD Respiratory: No increased work of breathing.  Left lower extremity: Dressing changed, incisional vac removed with no output in canister. incision CDI. Tolerates gentle ankle motion. Motor and sensory function intact distally. +DP pulse  Disposition: Discharge disposition: 01-Home or Self Care        Allergies as of 06/15/2020   No Known Allergies     Medication List    STOP taking these medications   doxycycline 100 MG capsule Commonly known as: MONODOX   enoxaparin 40 MG/0.4ML injection Commonly known as: LOVENOX   gabapentin 100 MG capsule Commonly known as: NEURONTIN   ketoconazole 2 % cream Commonly known as: NIZORAL   methocarbamol 500 MG tablet Commonly known as: ROBAXIN   oxyCODONE 5 MG immediate release tablet Commonly known as: Oxy IR/ROXICODONE   triamcinolone cream 0.5 % Commonly known as: KENALOG     TAKE these medications   acetaminophen 500 MG tablet Commonly known as: TYLENOL Take 1,000 mg by mouth every 6 (six) hours as needed for mild pain.   albuterol 108 (90 Base) MCG/ACT inhaler Commonly known as: VENTOLIN HFA Inhale 2 puffs into the lungs every 4 (four) hours as needed for wheezing or shortness of breath (cough).   alendronate 70 MG tablet Commonly known as: FOSAMAX Take 1 tablet (70 mg total) by mouth once a week. Take with a full glass of water on an  empty stomach.   amLODipine 10 MG tablet Commonly known as: NORVASC Take 1 tablet (10 mg total) by mouth daily.   aspirin 325 MG tablet Take 1 tablet (325 mg total) by mouth daily.   azelastine 0.1 % nasal spray Commonly known as: ASTELIN Place 1-2 sprays into both nostrils 2 (two) times daily as needed for allergies.   Azelastine-Fluticasone 137-50 MCG/ACT Susp Place 2 sprays into the nose daily.   cetirizine 10 MG tablet Commonly known as: ZYRTEC Take 10 mg by mouth daily as needed for allergies.   docusate sodium 100 MG capsule Commonly known as: COLACE Take 1 capsule (100 mg total) by mouth 2 (two) times daily.   EPINEPHrine 0.3 mg/0.3 mL Soaj injection Commonly known as: EPI-PEN Inject 0.3 mg into the muscle as needed for anaphylaxis (for allergy shots).   ferrous sulfate 325 (65 FE) MG EC tablet Take 1 tablet (325 mg total) by mouth 3 (three) times daily with meals. What changed: when to take this   fluticasone 50 MCG/ACT nasal spray Commonly known as: FLONASE Place 2 sprays into both nostrils daily as needed for allergies.   hydrOXYzine 25 MG tablet Commonly known as: ATARAX/VISTARIL Take 1 tablet (25 mg total) by mouth at bedtime as needed (insomnia).   ketorolac 10 MG tablet Commonly known as: TORADOL Take 1 tablet (10 mg total) by mouth every 6 (six) hours as needed.   levofloxacin 750 MG tablet Commonly known as: Levaquin Take 1 tablet (750 mg total) by mouth daily for 20 days.   losartan 50 MG tablet Commonly known as: COZAAR Take 1 tablet (50 mg total) by mouth daily.   montelukast 10 MG tablet Commonly known as: SINGULAIR Take 1 tablet (10 mg total)  by mouth at bedtime. What changed:   when to take this  reasons to take this   multivitamin with minerals Tabs tablet Take 1 tablet by mouth daily. One-A-Day Multivitamin   rosuvastatin 10 MG tablet Commonly known as: CRESTOR TAKE 1 TABLET(10 MG) BY MOUTH DAILY What changed: See the new  instructions.       Follow-up Information    Haddix, Thomasene Lot, MD. Schedule an appointment as soon as possible for a visit in 2 week(s).   Specialty: Orthopedic Surgery Why: for repeat x-rays and wound check  Contact information: [place] [address] [phone]        Carlyle Basques, MD. Schedule an appointment as soon as possible for a visit in 3 week(s).   Specialty: Infectious Diseases Contact information: [place] [id number] [phone]               Discharge Instructions and Plan: Patient will be discharged to home. Will be discharged on Aspirin for DVT prophylaxis. Patient has all the necessary DME for discharge. Patient will follow up with Dr. Doreatha Martin in 2 weeks for repeat x-rays of the left tibia and suture removal.  Is scheduled to follow-up with Dr. Graylon Good with infectious disease on 07/02/2020   Signed:  Leary Roca. Carmie Kanner ?[phone]? (phone) 06/16/2020, 2:27 PM  [facility] [place] [phone] (F)

## 2020-06-17 LAB — AEROBIC/ANAEROBIC CULTURE W GRAM STAIN (SURGICAL/DEEP WOUND)

## 2020-06-23 ENCOUNTER — Other Ambulatory Visit: Payer: Self-pay | Admitting: Obstetrics and Gynecology

## 2020-06-23 DIAGNOSIS — Z8739 Personal history of other diseases of the musculoskeletal system and connective tissue: Secondary | ICD-10-CM

## 2020-06-24 NOTE — Telephone Encounter (Signed)
Please advise on refill.

## 2020-07-02 ENCOUNTER — Ambulatory Visit: Payer: Federal, State, Local not specified - PPO | Admitting: Infectious Diseases

## 2020-07-02 ENCOUNTER — Encounter: Payer: Self-pay | Admitting: Infectious Diseases

## 2020-07-02 ENCOUNTER — Other Ambulatory Visit: Payer: Self-pay

## 2020-07-02 VITALS — BP 134/84 | HR 103 | Temp 98.1°F | Wt 170.2 lb

## 2020-07-02 DIAGNOSIS — T8149XA Infection following a procedure, other surgical site, initial encounter: Secondary | ICD-10-CM | POA: Diagnosis not present

## 2020-07-02 DIAGNOSIS — S82452A Displaced comminuted fracture of shaft of left fibula, initial encounter for closed fracture: Secondary | ICD-10-CM

## 2020-07-02 DIAGNOSIS — S82202F Unspecified fracture of shaft of left tibia, subsequent encounter for open fracture type IIIA, IIIB, or IIIC with routine healing: Secondary | ICD-10-CM | POA: Diagnosis not present

## 2020-07-02 DIAGNOSIS — S82872F Displaced pilon fracture of left tibia, subsequent encounter for open fracture type IIIA, IIIB, or IIIC with routine healing: Secondary | ICD-10-CM | POA: Diagnosis not present

## 2020-07-02 NOTE — Assessment & Plan Note (Signed)
Pseudomonas deep tissue infection following ORIF to repair fracture after falling through a ceiling into her garage. She has improved significantly and reports no drainage or signs of local or systemic infection. She does have some intermittent swelling to the left lateral ankle mostly that is moderately relieved by the morning. She will finish out taking her antibiotics as planned to complete the initially prescribed 3 week course.  Her initial inflammatory markers and WBC were never elevated prior to or following surgery - we discussed deferring these today given that she has had a good clinical response to surgery & antiboitcs as planned.   She will follow up with Dr. Doreatha Martin as scheduled. Precautions discussed that would warrant resuming antibiotics - she will let us know if she has trouble.   Suggested compression stocking worn during the day to help with swelling around ankle. Will have her start with 10-86mmHg pressure - has warm food, 2+ DP. May need to increase to 20-13mmHg but will defer to PT team.

## 2020-07-02 NOTE — Patient Instructions (Addendum)
It appears you are healing very nicely after surgery and the antibiotics. Please finish out the last few pills you have now.   If you experience any redness, pain, or drainage to the incision after you stop please let our office know [phone].   Please continue to follow up with Dr. Doreatha Martin. You are welcome to come back to see [place] as needed.   For the swelling the Compression Stockings (10-20 mmHg). You may need a higher amount of pressure with 20-30 mmHg but I would ask you talk with the physical therapy team.

## 2020-07-02 NOTE — Assessment & Plan Note (Signed)
FU care per ortho trauma team.

## 2020-07-02 NOTE — Progress Notes (Signed)
Patient: Kendra Allison  DOB: 1955-09-07 MRN: [id number] PCP: Olin Hauser, DO    Subjective:   Chief Complaint  Patient presents with  . Follow-up    HPI:  Kendra Allison is a 65 y.o. female  with hx of HTN, GERD, asthma who sustained an open left tib/pilon fracture in mid-November 2021 when she fell through the attic into the garage and landed on her left leg. She underwent initial I&D with external fixation then transitioned to ORIF with IM nail of left tibia. Wound dehiscence noted 2-3 weeks post op. Started on PO doxycycline by Dr. Doreatha Martin in early January 2022 and taken to OR for I&D given chills and systemic symptoms. OR note suggested probing to fracture site but no signs of osteomyelitis, removed non-viable tissue and wound cultures grew out pseudomonas, sensitive to fluoroquinolones.   She was treated with IV cefepime during hospitalization and sent out on Levaquin 750 mg QD x 3 weeks total.  Baseline ESR 11, CRP 0.6 mg/dL, no leukocytosis.    Here today for 3 week follow up. Starting physical therapy soon.  Saw Dr. Doreatha Martin today and was cleared to increase weight bearing and start physical therapy soon. Xrays looked good based on her understanding. Did not want to remove stitches quite yet. Occasionally has noticed some nausea with her antibiotics but not consistent. She has no chills/fevers and feels much much better. She follows up with him on Feb 22.  She reports that the pain is significantly improved. She is having some intermittent swelling when she takes her brace off that improves overnight. Looking at an ankle brace/sleeve to help support the joint and help swelling.    Review of Systems  Constitutional: Negative for chills, fever, malaise/fatigue and weight loss.  Respiratory: Negative for cough and sputum production.   Cardiovascular: Positive for leg swelling (intermittent and improved overnight). Negative for chest pain.  Gastrointestinal:  Positive for nausea. Negative for abdominal pain, diarrhea and vomiting.  Musculoskeletal: Negative for joint pain.  Skin: Negative for rash.  Neurological: Negative for dizziness, tingling and headaches.  Psychiatric/Behavioral: Negative for depression and substance abuse. The patient is not nervous/anxious and does not have insomnia.   All other systems reviewed and are negative.   Past Medical History:  Diagnosis Date  . Asthma   . GERD (gastroesophageal reflux disease)   . Hypercholesteremia   . Hyperlipidemia   . Hypertension   . Osteoporosis     Outpatient Medications Prior to Visit  Medication Sig Dispense Refill  . acetaminophen (TYLENOL) 500 MG tablet Take 1,000 mg by mouth every 6 (six) hours as needed for mild pain.    Marland Kitchen albuterol (VENTOLIN HFA) 108 (90 Base) MCG/ACT inhaler Inhale 2 puffs into the lungs every 4 (four) hours as needed for wheezing or shortness of breath (cough). 1 each 2  . alendronate (FOSAMAX) 70 MG tablet TAKE 1 TABLET(70 MG) BY MOUTH 1 TIME A WEEK WITH A FULL GLASS OF WATER AND ON AN EMPTY STOMACH 12 tablet 3  . amLODipine (NORVASC) 10 MG tablet Take 1 tablet (10 mg total) by mouth daily. 90 tablet 1  . aspirin 325 MG tablet Take 1 tablet (325 mg total) by mouth daily. 30 tablet 0  . azelastine (ASTELIN) 0.1 % nasal spray Place 1-2 sprays into both nostrils 2 (two) times daily as needed for allergies.     . cetirizine (ZYRTEC) 10 MG tablet Take 10 mg by mouth daily as needed for allergies.    Marland Kitchen  EPINEPHrine 0.3 mg/0.3 mL IJ SOAJ injection Inject 0.3 mg into the muscle as needed for anaphylaxis (for allergy shots).    . fluticasone (FLONASE) 50 MCG/ACT nasal spray Place 2 sprays into both nostrils daily as needed for allergies.    Marland Kitchen ketorolac (TORADOL) 10 MG tablet Take 1 tablet (10 mg total) by mouth every 6 (six) hours as needed. 20 tablet 0  . levofloxacin (LEVAQUIN) 750 MG tablet Take 1 tablet (750 mg total) by mouth daily for 20 days. 20 tablet 0  .  losartan (COZAAR) 50 MG tablet Take 1 tablet (50 mg total) by mouth daily. 90 tablet 1  . montelukast (SINGULAIR) 10 MG tablet Take 1 tablet (10 mg total) by mouth at bedtime. (Patient taking differently: Take 10 mg by mouth daily as needed (respiratory/allergies.).) 90 tablet 3  . Multiple Vitamin (MULTIVITAMIN WITH MINERALS) TABS tablet Take 1 tablet by mouth daily. One-A-Day Multivitamin    . rosuvastatin (CRESTOR) 10 MG tablet TAKE 1 TABLET(10 MG) BY MOUTH DAILY (Patient taking differently: Take 10 mg by mouth daily.) 90 tablet 2  . Azelastine-Fluticasone 137-50 MCG/ACT SUSP Place 2 sprays into the nose daily. (Patient not taking: No sig reported) 23 g 3  . docusate sodium (COLACE) 100 MG capsule Take 1 capsule (100 mg total) by mouth 2 (two) times daily. (Patient not taking: No sig reported) 14 capsule 0  . ferrous sulfate 325 (65 FE) MG EC tablet Take 1 tablet (325 mg total) by mouth 3 (three) times daily with meals. (Patient not taking: Reported on 07/02/2020) 90 tablet 2  . hydrOXYzine (ATARAX/VISTARIL) 25 MG tablet Take 1 tablet (25 mg total) by mouth at bedtime as needed (insomnia). (Patient not taking: No sig reported) 30 tablet 2   No facility-administered medications prior to visit.     No Known Allergies  Social History   Tobacco Use  . Smoking status: Never Smoker  . Smokeless tobacco: Never Used  . Tobacco comment: Quit 40years ago.   Vaping Use  . Vaping Use: Never used  Substance Use Topics  . Alcohol use: Yes    Comment: "rarely"   . Drug use: Never    No family history on file.  Objective:   Vitals:   07/02/20 1425  BP: 134/84  Pulse: (!) 103  Temp: 98.1 F (36.7 C)  TempSrc: Oral  Weight: 170 lb 3.2 oz (77.2 kg)   Body mass index is 27.47 kg/m.  Physical Exam Vitals reviewed.  Constitutional:      Appearance: Normal appearance. She is not ill-appearing.  Cardiovascular:     Rate and Rhythm: Normal rate.     Pulses: Normal pulses.  Pulmonary:      Effort: Pulmonary effort is normal.  Musculoskeletal:        General: Swelling (swelling around lateral left ankle. ) present.     Comments: Medial lower leg surgical incision is well approximated with sutures remaining in place. No erythema or tenderness noted. No drainage.   Neurological:     Mental Status: She is oriented to person, place, and time.  Psychiatric:        Mood and Affect: Mood normal.        Thought Content: Thought content normal.     Lab Results: Lab Results  Component Value Date   WBC 6.1 06/14/2020   HGB 10.9 (L) 06/14/2020   HCT 34.2 (L) 06/14/2020   MCV 92.7 06/14/2020   PLT 290 06/14/2020    Lab Results  Component  Value Date   CREATININE 1.06 (H) 06/11/2020   BUN 16 06/11/2020   NA 138 06/11/2020   K 3.8 06/11/2020   CL 103 06/11/2020   CO2 25 06/11/2020    Lab Results  Component Value Date   ALT 16 06/11/2020   AST 17 06/11/2020   ALKPHOS 64 06/11/2020   BILITOT 0.7 06/11/2020     Assessment & Plan:   Problem List Items Addressed This Visit      Unprioritized   Postoperative wound infection - Primary    Pseudomonas deep tissue infection following ORIF to repair fracture after falling through a ceiling into her garage. She has improved significantly and reports no drainage or signs of local or systemic infection. She does have some intermittent swelling to the left lateral ankle mostly that is moderately relieved by the morning. She will finish out taking her antibiotics as planned to complete the initially prescribed 3 week course.  Her initial inflammatory markers and WBC were never elevated prior to or following surgery - we discussed deferring these today given that she has had a good clinical response to surgery & antiboitcs as planned.   She will follow up with Dr. Doreatha Martin as scheduled. Precautions discussed that would warrant resuming antibiotics - she will let us know if she has trouble.   Suggested compression stocking worn during the  day to help with swelling around ankle. Will have her start with 10-3mHg pressure - has warm food, 2+ DP. May need to increase to 20-354mg but will defer to PT team.       Displaced comminuted fracture of shaft of left fibula, initial encounter for closed fracture    FU care per ortho trauma team.          StJanene MadeiraMSN, NP-C [place]: [phone]: [phone]/08/22  3:00 PM

## 2020-07-09 ENCOUNTER — Other Ambulatory Visit: Payer: Self-pay

## 2020-07-09 ENCOUNTER — Ambulatory Visit: Payer: Federal, State, Local not specified - PPO | Attending: Student | Admitting: Physical Therapy

## 2020-07-09 ENCOUNTER — Encounter: Payer: Self-pay | Admitting: Physical Therapy

## 2020-07-09 DIAGNOSIS — M25572 Pain in left ankle and joints of left foot: Secondary | ICD-10-CM | POA: Diagnosis not present

## 2020-07-09 DIAGNOSIS — M25672 Stiffness of left ankle, not elsewhere classified: Secondary | ICD-10-CM | POA: Insufficient documentation

## 2020-07-09 NOTE — Therapy (Signed)
[facility] [address], [place], [address] Phone: [phone]   Fax:  [phone]  Physical Therapy Treatment  Patient Details  Name: Kendra Allison MRN: [id number] Date of Birth: 07-Dec-1955 No data recorded  Encounter Date: 07/09/2020   PT End of Session - 07/09/20 1012    Visit Number 1    Number of Visits 17    Date for PT Re-Evaluation 09/03/20    Authorization - Visit Number 1    Authorization - Number of Visits 17    PT Start Time 0900    PT Stop Time 0945    PT Time Calculation (min) 45 min    Equipment Utilized During Treatment Gait belt;Other (comment)   Rolling walker; Ankle brace   Activity Tolerance Patient tolerated treatment well    Behavior During Therapy [facility] for tasks assessed/performed           Past Medical History:  Diagnosis Date  . Asthma   . GERD (gastroesophageal reflux disease)   . Hypercholesteremia   . Hyperlipidemia   . Hypertension   . Osteoporosis     Past Surgical History:  Procedure Laterality Date  . ABDOMINAL HYSTERECTOMY    . BREAST BIOPSY    . COLONOSCOPY WITH PROPOFOL N/A 08/10/2019   Procedure: COLONOSCOPY WITH PROPOFOL;  Surgeon: Jonathon Bellows, MD;  Location: [facility] ENDOSCOPY;  Service: Gastroenterology;  Laterality: N/A;  . ESOPHAGOGASTRODUODENOSCOPY (EGD) WITH PROPOFOL N/A 08/10/2019   Procedure: ESOPHAGOGASTRODUODENOSCOPY (EGD) WITH PROPOFOL;  Surgeon: Jonathon Bellows, MD;  Location: [facility] ENDOSCOPY;  Service: Gastroenterology;  Laterality: N/A;  . EXTERNAL FIXATION LEG Left 04/07/2020    EXTERNAL FIXATION LEG (Left Leg Lower)  . EXTERNAL FIXATION LEG Left 04/07/2020   Procedure: EXTERNAL FIXATION LEG;  Surgeon: Shona Needles, MD;  Location: [place];  Service: Orthopedics;  Laterality: Left;  . EXTERNAL FIXATION REMOVAL Left 04/10/2020   Procedure: REMOVAL EXTERNAL FIXATION LEG;  Surgeon: Shona Needles, MD;  Location: [place];  Service: Orthopedics;   Laterality: Left;  . I & D EXTREMITY Left 04/07/2020   Procedure: IRRIGATION AND DEBRIDEMENT EXTREMITY;  Surgeon: Shona Needles, MD;  Location: [place];  Service: Orthopedics;  Laterality: Left;  . I & D EXTREMITY Left 06/12/2020   Procedure: IRRIGATION AND DEBRIDEMENT EXTREMITY;  Surgeon: Shona Needles, MD;  Location: [place];  Service: Orthopedics;  Laterality: Left;  . Nissem  2014  . TIBIA IM NAIL INSERTION Left 04/10/2020   Procedure: INTRAMEDULLARY (IM) NAIL TIBIAL;  Surgeon: Shona Needles, MD;  Location: [place];  Service: Orthopedics;  Laterality: Left;    There were no vitals filed for this visit.   Subjective Assessment - 07/09/20 0901    Pertinent History Pt is 65 y.o s/p  initial I&D with external fixation 04/07/20, then transitioned to ORIF with IM nail of left proximal tibia, and distal tibia/fibula 04/10/20. Subsequent irrigation and drainage of wound 06/11/20. Currently in post op shoe, reporting that surgeon advised her to seek PT opinion on less restrictive shoe option. Is currently unable to wear normal shoes due to swelling. Wearing boot throughout entire day, using RW for ambulation (previously ind without AD), is WBAT per MD notes- though patient reports she cannot tolerate hardly any pressure at this time, and per patient without any other surgical precautions. Is having stitches removed 07/16/20. Uses a bone stimulator multiple times per day (per Xrays delayed healing of distal tibia and fibula fracture). Pain is currently 2/10  worst 4/10. Pain is aggrevated mainly with pronlonged walking. Pain is eased with ice and rest and motrin prn. Pain in the morning is fine and only gets worst depending on amount of walking done around the house throughout the day. Activities impaired cooking, cleaning, grocery shopping, bathing, lifting. Pt is currently retired. Pt would like to get back to normal walking without any assisstive device. Denies any N/T, B/B changes, weight changes, night  sweats/pain.    Limitations Walking;House hold activities    How long can you sit comfortably? unlimited    How long can you stand comfortably? unlimited    How long can you walk comfortably? 1-2 minutes    Patient Stated Goals Get back to normal walking without an assisstive device    Currently in Pain? Yes    Pain Score 2     Pain Location Ankle    Pain Orientation Left    Pain Descriptors / Indicators Dull;Sore;Tightness    Pain Type Chronic pain;Surgical pain    Pain Onset More than a month ago    Pain Frequency Constant    Aggravating Factors  walking    Pain Relieving Factors ice, rest, motrin prn    Effect of Pain on Daily Activities unable to go grocery shopping, and limited cooking, cleaning, bathing, walking           Eval     Posture/Gait -  Posture with nearly 100% WB on R leg With RW and post op boot with decreased speed, step to pattern with decreased LLE stance time and RLE step length, minimal LLE WB with increased BUE support with RLE step to  Palpation/Sensation -  TTP around lateral/medial malleoli w/ some swelling. Stiches on the medial side of ankle with no concerns to skin integrity.  FUNCTIONAL TEST -  SLS 8 sec on R; 10 MWT 0.60m/s;  PF heel raise test on R 10   REPEATED MOTIONS - not tested; no concerns of centralization/peripherilization  AROM/OP  HIP FLEX - WNL EXT - WNL IR - WNL ER - WNL  KNEE FLEX  L/R  WNL EXT - WNL   Ankle DF-  R 15  L 9 PF - R 39 L 15 INV - limited EVER - limited   PROM/OP  HIP FLEX - WNL EXT - WNL IR - WNL ER - WNL   KNEE FLEX  L/R - WNL EXT L/R -   WNL   Ankle DF-  R 20  L 11 PF -  R 40 L 18 INV - limited EVER - limited   MUSCLE LENGTH TEST  Marcello Moores - WNL [place] - WNL  STRENGTH Hip flex  L/R 5 Hip ext L/R 4 Hip abd  L/R 4/5 Hip add  L/R  3/4 Hip ER  L/R 5  Hip IR  L/R  5  Knee flex L/R  5 ext   L/R 5   Ankle DF L/R 2+/5 PF SL heel raise L unable R 10    SPECIAL TESTS Not tested; no  concerns of other pathology       Therex     PT reviewed the following HEP with patient with patient able to demonstrate a set of the following with min cuing for correction needed. PT educated patient on parameters of therex; how/when to inc/decrease intensity, frequency, rep/set range, and purpose of therex with verbalized understanding from patient   SLS R leg to walker for balance 3x 15-10 sec 1x day 2-3 d/wk  Seated gastroc  stretch 3x 30 sec daily   Seated PF 3x 10 1x day 2-3 d/wk                       PT Education - 07/09/20 1011    Education Details PT educated pt on HEP, Diagnosis/prognosis    Person(s) Educated Patient    Methods Explanation;Demonstration;Tactile cues;Verbal cues    Comprehension Verbal cues required;Returned demonstration;Verbalized understanding;Tactile cues required            PT Short Term Goals - 07/09/20 1023      PT SHORT TERM GOAL #1   Title Pt will be independent with HEP in order to increase ankle mobility and strength and balance in order to perform household ADLs safely.    Baseline 07/09/20 HEP given    Time 4    Period Weeks    Status New             PT Long Term Goals - 07/09/20 1025      PT LONG TERM GOAL #1   Title Pt will increase gait speed to at least 0.40m/s to demonstrate safe, normal community speed    Baseline 07/09/20 0.31    Time 8    Period Weeks    Status New      PT LONG TERM GOAL #2   Title Pt will demonstrate at least 10 LLE heel raises to demonstrate baseline PF strength in order to particpate in safe community activities and household ADLs    Baseline 07/09/20 R -10 L - unable to test    Time 8    Period Weeks    Status New      PT LONG TERM GOAL #3   Title Pt will increase L SL stance to at least 8secs in order to demonstrate baseline static balance, needed to demonstrate decreased fall risk    Baseline 07/09/20 R 8 sec L - unable to test    Time 8    Period Weeks    Status New       PT LONG TERM GOAL #4   Title Pt will demonstrate ind ambulation without AD with normalized gait mechanics to demonstrate return to PLOF and safety with community ambulation.    Baseline 07/09/20 with RW: step to gait with minimal RLE stance, decreased LLE step length, minimal WB on RLE    Time 8    Period Weeks    Status New      PT LONG TERM GOAL #5   Title Pt will increase L ankle DF A/PROM to at least 20 degrees to demonstrate normalized ankle mobility need for functional squatting and normalized gait mechanics    Baseline 07/09/20 A- 9d P- 11d    Time 8    Period Weeks    Status New                 Plan - 07/09/20 1014    Clinical Impression Statement Pt is a 65 yo female s/p tibia/fibula ORIF with distal and proximal IM (04/10/20, debridement of wound 06/11/20) following a fall in Nov of last year when her upstairs floor gave way. Examination reveals deficits of pain, balance, LE strength, limited A/PROM of the ankle, and gait mechanics (step to with RW, minimal RLE WB) limiting activites/participation of walking ind wihtout AD, lifting, cooking, cleaning, bathing; inhibiting participation in household ADLs and community activities/ambulation. Pt would benefit from skilled PT to address these impairments to return to prior level  of function.    Personal Factors and Comorbidities Fitness;Past/Current Experience;Comorbidity 1    Examination-Activity Limitations Bathing;Lift;Locomotion Level;Stand;Stairs;Other   walking   Examination-Participation Restrictions Cleaning;Shop;Community Activity    Stability/Clinical Decision Making Evolving/Moderate complexity    Clinical Decision Making Moderate    Rehab Potential Good    PT Frequency 2x / week    PT Duration 8 weeks    PT Treatment/Interventions ADLs/Self Care Home Management;Biofeedback;Cryotherapy;Ultrasound;Moist Heat;Iontophoresis 4mg /ml Dexamethasone;Electrical Stimulation;Gait training;Stair training;Functional mobility  training;Neuromuscular re-education;Balance training;Therapeutic exercise;Therapeutic activities;Patient/family education;Orthotic Fit/Training;Manual techniques;Dry needling;Passive range of motion;Scar mobilization;Compression bandaging;Manual lymph drainage;Energy conservation;Taping    PT Next Visit Plan HEP review, initiate POC    PT Home Exercise Plan SL stance, dorsiflexion stetch, seated plantar flexion    Consulted and Agree with Plan of Care Patient           Patient will benefit from skilled therapeutic intervention in order to improve the following deficits and impairments:  Abnormal gait,Decreased activity tolerance,Decreased balance,Decreased endurance,Decreased coordination,Decreased mobility,Decreased range of motion,Difficulty walking,Decreased strength,Decreased scar mobility,Decreased skin integrity,Hypomobility,Increased edema,Impaired flexibility,Increased fascial restricitons,Impaired sensation,Impaired tone,Impaired UE functional use,Pain,Improper body mechanics,Postural dysfunction  Visit Diagnosis: Pain in left ankle and joints of left foot  Stiffness of left ankle, not elsewhere classified     Problem List Patient Active Problem List   Diagnosis Date Noted  . Postoperative wound infection 06/11/2020  . Surgical site infection 06/11/2020  . Asthma   . GERD (gastroesophageal reflux disease)   . Hypertension   . Fracture of tibia and fibula, shaft, left, open type III, initial encounter 04/10/2020  . Displaced comminuted fracture of shaft of left fibula, initial encounter for closed fracture 04/10/2020  . Fall from height of greater than 3 feet 04/10/2020  . Open displaced pilon fracture of left tibia 04/07/2020  . Hyperlipidemia   . Mild concentric left ventricular hypertrophy (LVH) 03/09/2019  . Essential hypertension 03/01/2019    Durwin Reges DPT Turner Daniels, SPT  Durwin Reges 07/09/2020, 4:00 PM  [facility] [address] [place], [address] Phone: [phone]   Fax:  [phone]  Name: Kendra Allison MRN: [id number] Date of Birth: 1956-03-15

## 2020-07-11 ENCOUNTER — Ambulatory Visit: Payer: Federal, State, Local not specified - PPO | Admitting: Physical Therapy

## 2020-07-11 ENCOUNTER — Encounter: Payer: Self-pay | Admitting: Physical Therapy

## 2020-07-11 ENCOUNTER — Other Ambulatory Visit: Payer: Self-pay

## 2020-07-11 DIAGNOSIS — M25572 Pain in left ankle and joints of left foot: Secondary | ICD-10-CM | POA: Diagnosis not present

## 2020-07-11 DIAGNOSIS — M25672 Stiffness of left ankle, not elsewhere classified: Secondary | ICD-10-CM | POA: Diagnosis not present

## 2020-07-11 NOTE — Therapy (Signed)
[facility] [address] [place], [address] Phone: [phone]   Fax:  [phone]  Physical Therapy Treatment  Patient Details  Name: Kendra Allison MRN: [id number] Date of Birth: [birth year] No data recorded  Encounter Date: 07/11/2020   PT End of Session - 07/11/20 0956    Visit Number 2    Number of Visits 17    Date for PT Re-Evaluation 09/03/20    Authorization - Visit Number 2    Authorization - Number of Visits 17    PT Start Time 0947    PT Stop Time 1028    PT Time Calculation (min) 41 min    Activity Tolerance Patient tolerated treatment well    Behavior During Therapy [facility] for tasks assessed/performed           Past Medical History:  Diagnosis Date  . Asthma   . GERD (gastroesophageal reflux disease)   . Hypercholesteremia   . Hyperlipidemia   . Hypertension   . Osteoporosis     Past Surgical History:  Procedure Laterality Date  . ABDOMINAL HYSTERECTOMY    . BREAST BIOPSY    . COLONOSCOPY WITH PROPOFOL N/A 08/10/2019   Procedure: COLONOSCOPY WITH PROPOFOL;  Surgeon: Jonathon Bellows, MD;  Location: [facility] ENDOSCOPY;  Service: Gastroenterology;  Laterality: N/A;  . ESOPHAGOGASTRODUODENOSCOPY (EGD) WITH PROPOFOL N/A 08/10/2019   Procedure: ESOPHAGOGASTRODUODENOSCOPY (EGD) WITH PROPOFOL;  Surgeon: Jonathon Bellows, MD;  Location: [facility] ENDOSCOPY;  Service: Gastroenterology;  Laterality: N/A;  . EXTERNAL FIXATION LEG Left 04/07/2020    EXTERNAL FIXATION LEG (Left Leg Lower)  . EXTERNAL FIXATION LEG Left 04/07/2020   Procedure: EXTERNAL FIXATION LEG;  Surgeon: Shona Needles, MD;  Location: [place];  Service: Orthopedics;  Laterality: Left;  . EXTERNAL FIXATION REMOVAL Left 04/10/2020   Procedure: REMOVAL EXTERNAL FIXATION LEG;  Surgeon: Shona Needles, MD;  Location: [place];  Service: Orthopedics;  Laterality: Left;  . I & D EXTREMITY Left 04/07/2020   Procedure: IRRIGATION AND DEBRIDEMENT EXTREMITY;   Surgeon: Shona Needles, MD;  Location: [place];  Service: Orthopedics;  Laterality: Left;  . I & D EXTREMITY Left 06/12/2020   Procedure: IRRIGATION AND DEBRIDEMENT EXTREMITY;  Surgeon: Shona Needles, MD;  Location: [place];  Service: Orthopedics;  Laterality: Left;  . Nissem  2014  . TIBIA IM NAIL INSERTION Left 04/10/2020   Procedure: INTRAMEDULLARY (IM) NAIL TIBIAL;  Surgeon: Shona Needles, MD;  Location: [place];  Service: Orthopedics;  Laterality: Left;    There were no vitals filed for this visit.   Subjective Assessment - 07/11/20 0946    Subjective Pt reports no ankle pain currently, has some questions and wants to review HEP.    Pertinent History Pt is 65 y.o s/p  initial I&D with external fixation 04/07/20, then transitioned to ORIF with IM nail of left proximal tibia, and distal tibia/fibula 04/10/20. Subsequent irrigation and drainage of wound 06/11/20. Currently in post op shoe, reporting that surgeon advised her to seek PT opinion on less restrictive shoe option. Is currently unable to wear normal shoes due to swelling. Wearing boot throughout entire day, using RW for ambulation (previously ind without AD), is WBAT per MD notes- though patient reports she cannot tolerate hardly any pressure at this time, and per patient without any other surgical precautions. Is having stitches removed 07/16/20. Uses a bone stimulator multiple times per day (per Xrays delayed healing of distal tibia and fibula fracture). Pain  is currently 2/10 worst 4/10. Pain is aggrevated mainly with pronlonged walking. Pain is eased with ice and rest and motrin prn. Pain in the morning is fine and only gets worst depending on amount of walking done around the house throughout the day. Activities impaired cooking, cleaning, grocery shopping, bathing, lifting. Pt is currently retired. Pt would like to get back to normal walking without any assisstive device. Denies any N/T, B/B changes, weight changes, night sweats/pain.     Limitations Walking;House hold activities    How long can you sit comfortably? unlimited    How long can you stand comfortably? unlimited    How long can you walk comfortably? 1-2 minutes    Patient Stated Goals Get back to normal walking without an assisstive device    Pain Onset More than a month ago           Therex  Nustep LE 7 UE 9 Level 1 for gentle motion and strengthening  Baps board w/ middle setting 2x12 DF <> PF; 2x12 inversion <> eversion; cuing to maintain contact with board and not to initiate movement using hip with  carry over  PT reviewed HEP with pt returning verbalized understanding and demonstration of reps/sets   Seated PF 2x10  Seated gastroc stretch 2x10; sliding heel as far back until it starts to rise  Manual Therapy  Grade 3 mobilizations of talocrural joint using Posterior/Anterier/ Lateral/ Medial glides  PROM stretch of DF/PF/Inv/Ever                           PT Education - 07/11/20 0955    Education Details therex form/technique, HEP review    Person(s) Educated Patient    Methods Explanation;Demonstration;Tactile cues    Comprehension Verbalized understanding;Returned demonstration;Verbal cues required            PT Short Term Goals - 07/09/20 1023      PT SHORT TERM GOAL #1   Title Pt will be independent with HEP in order to increase ankle mobility and strength and balance in order to perform household ADLs safely.    Baseline 07/09/20 HEP given    Time 4    Period Weeks    Status New             PT Long Term Goals - 07/09/20 1025      PT LONG TERM GOAL #1   Title Pt will increase gait speed to at least 0.64m/s to demonstrate safe, normal community speed    Baseline 07/09/20 0.31    Time 8    Period Weeks    Status New      PT LONG TERM GOAL #2   Title Pt will demonstrate at least 10 LLE heel raises to demonstrate baseline PF strength in order to particpate in safe community activities and household  ADLs    Baseline 07/09/20 R -10 L - unable to test    Time 8    Period Weeks    Status New      PT LONG TERM GOAL #3   Title Pt will increase L SL stance to at least 8secs in order to demonstrate baseline static balance, needed to demonstrate decreased fall risk    Baseline 07/09/20 R 8 sec L - unable to test    Time 8    Period Weeks    Status New      PT LONG TERM GOAL #4   Title Pt will  demonstrate ind ambulation without AD with normalized gait mechanics to demonstrate return to PLOF and safety with community ambulation.    Baseline 07/09/20 with RW: step to gait with minimal RLE stance, decreased LLE step length, minimal WB on RLE    Time 8    Period Weeks    Status New      PT LONG TERM GOAL #5   Title Pt will increase L ankle DF A/PROM to at least 20 degrees to demonstrate normalized ankle mobility need for functional squatting and normalized gait mechanics    Baseline 07/09/20 A- 9d P- 11d    Time 8    Period Weeks    Status New                 Plan - 07/11/20 1034    Clinical Impression Statement PT initiated therex for increased L ankle mobility and strength for carry over into independent walking w/o using an assistive device. Pt was able to perform therex with proper technique following demonstration and cuing. Pt tolerated all therex and joint mobilizations with some tolerable increases in pain and good motivation throughout session. PT reviewed HEP with pt returning verbalized understanding and demonstration. PT will continue to progress as able.    Personal Factors and Comorbidities Fitness;Past/Current Experience;Comorbidity 1    Examination-Activity Limitations Bathing;Lift;Locomotion Level;Stand;Stairs;Other    Examination-Participation Restrictions Cleaning;Shop;Community Activity    Stability/Clinical Decision Making Evolving/Moderate complexity    Clinical Decision Making Moderate    Rehab Potential Good    PT Frequency 2x / week    PT Duration 8 weeks     PT Treatment/Interventions ADLs/Self Care Home Management;Biofeedback;Cryotherapy;Ultrasound;Moist Heat;Iontophoresis 4mg /ml Dexamethasone;Electrical Stimulation;Gait training;Stair training;Functional mobility training;Neuromuscular re-education;Balance training;Therapeutic exercise;Therapeutic activities;Patient/family education;Orthotic Fit/Training;Manual techniques;Dry needling;Passive range of motion;Scar mobilization;Compression bandaging;Manual lymph drainage;Energy conservation;Taping    PT Next Visit Plan HEP review, initiate POC    PT Home Exercise Plan SL stance, dorsiflexion stetch, seated plantar flexion    Consulted and Agree with Plan of Care Patient           Patient will benefit from skilled therapeutic intervention in order to improve the following deficits and impairments:  Abnormal gait,Decreased activity tolerance,Decreased balance,Decreased endurance,Decreased coordination,Decreased mobility,Decreased range of motion,Difficulty walking,Decreased strength,Decreased scar mobility,Decreased skin integrity,Hypomobility,Increased edema,Impaired flexibility,Increased fascial restricitons,Impaired sensation,Impaired tone,Impaired UE functional use,Pain,Improper body mechanics,Postural dysfunction  Visit Diagnosis: Pain in left ankle and joints of left foot  Stiffness of left ankle, not elsewhere classified     Problem List Patient Active Problem List   Diagnosis Date Noted  . Postoperative wound infection 06/11/2020  . Surgical site infection 06/11/2020  . Asthma   . GERD (gastroesophageal reflux disease)   . Hypertension   . Fracture of tibia and fibula, shaft, left, open type III, initial encounter 04/10/2020  . Displaced comminuted fracture of shaft of left fibula, initial encounter for closed fracture 04/10/2020  . Fall from height of greater than 3 feet 04/10/2020  . Open displaced pilon fracture of left tibia 04/07/2020  . Hyperlipidemia   . Mild concentric left  ventricular hypertrophy (LVH) 03/09/2019  . Essential hypertension 03/01/2019    Durwin Reges DPT Turner Daniels, SPT  Durwin Reges 07/11/2020, 4:12 PM  [facility] [address] [place], [address] Phone: [phone]   Fax:  [phone]  Name: Kendra Allison MRN: [id number] Date of Birth: 06-03-1955

## 2020-07-13 DIAGNOSIS — S82402C Unspecified fracture of shaft of left fibula, initial encounter for open fracture type IIIA, IIIB, or IIIC: Secondary | ICD-10-CM | POA: Diagnosis not present

## 2020-07-13 DIAGNOSIS — S82202C Unspecified fracture of shaft of left tibia, initial encounter for open fracture type IIIA, IIIB, or IIIC: Secondary | ICD-10-CM | POA: Diagnosis not present

## 2020-07-13 DIAGNOSIS — S82452A Displaced comminuted fracture of shaft of left fibula, initial encounter for closed fracture: Secondary | ICD-10-CM | POA: Diagnosis not present

## 2020-07-13 DIAGNOSIS — S82872B Displaced pilon fracture of left tibia, initial encounter for open fracture type I or II: Secondary | ICD-10-CM | POA: Diagnosis not present

## 2020-07-16 ENCOUNTER — Other Ambulatory Visit: Payer: Self-pay

## 2020-07-16 ENCOUNTER — Encounter: Payer: Self-pay | Admitting: Physical Therapy

## 2020-07-16 ENCOUNTER — Ambulatory Visit: Payer: Federal, State, Local not specified - PPO | Admitting: Physical Therapy

## 2020-07-16 DIAGNOSIS — M25672 Stiffness of left ankle, not elsewhere classified: Secondary | ICD-10-CM

## 2020-07-16 DIAGNOSIS — S82872D Displaced pilon fracture of left tibia, subsequent encounter for closed fracture with routine healing: Secondary | ICD-10-CM | POA: Diagnosis not present

## 2020-07-16 DIAGNOSIS — T8149XD Infection following a procedure, other surgical site, subsequent encounter: Secondary | ICD-10-CM | POA: Diagnosis not present

## 2020-07-16 DIAGNOSIS — M25572 Pain in left ankle and joints of left foot: Secondary | ICD-10-CM

## 2020-07-16 NOTE — Therapy (Signed)
[place] [facility] [address], [place], [address] Phone: [phone]   Fax:  [phone]  Physical Therapy Treatment  Patient Details  Name: Kendra Allison MRN: [id number] Date of Birth: [birth year] No data recorded  Encounter Date: 07/16/2020   PT End of Session - 07/16/20 1435    Visit Number 3    Number of Visits 17    Date for PT Re-Evaluation 09/03/20    Authorization - Visit Number 3    Authorization - Number of Visits 17    PT Start Time 1430    PT Stop Time 1510    PT Time Calculation (min) 40 min    Equipment Utilized During Treatment Gait belt;Other (comment)    Activity Tolerance Patient tolerated treatment well    Behavior During Therapy [facility] for tasks assessed/performed           Past Medical History:  Diagnosis Date  . Asthma   . GERD (gastroesophageal reflux disease)   . Hypercholesteremia   . Hyperlipidemia   . Hypertension   . Osteoporosis     Past Surgical History:  Procedure Laterality Date  . ABDOMINAL HYSTERECTOMY    . BREAST BIOPSY    . COLONOSCOPY WITH PROPOFOL N/A 08/10/2019   Procedure: COLONOSCOPY WITH PROPOFOL;  Surgeon: Jonathon Bellows, MD;  Location: [facility] ENDOSCOPY;  Service: Gastroenterology;  Laterality: N/A;  . ESOPHAGOGASTRODUODENOSCOPY (EGD) WITH PROPOFOL N/A 08/10/2019   Procedure: ESOPHAGOGASTRODUODENOSCOPY (EGD) WITH PROPOFOL;  Surgeon: Jonathon Bellows, MD;  Location: [facility] ENDOSCOPY;  Service: Gastroenterology;  Laterality: N/A;  . EXTERNAL FIXATION LEG Left 04/07/2020    EXTERNAL FIXATION LEG (Left Leg Lower)  . EXTERNAL FIXATION LEG Left 04/07/2020   Procedure: EXTERNAL FIXATION LEG;  Surgeon: Shona Needles, MD;  Location: [place];  Service: Orthopedics;  Laterality: Left;  . EXTERNAL FIXATION REMOVAL Left 04/10/2020   Procedure: REMOVAL EXTERNAL FIXATION LEG;  Surgeon: Shona Needles, MD;  Location: [place];  Service: Orthopedics;  Laterality: Left;  . I & D EXTREMITY  Left 04/07/2020   Procedure: IRRIGATION AND DEBRIDEMENT EXTREMITY;  Surgeon: Shona Needles, MD;  Location: [place];  Service: Orthopedics;  Laterality: Left;  . I & D EXTREMITY Left 06/12/2020   Procedure: IRRIGATION AND DEBRIDEMENT EXTREMITY;  Surgeon: Shona Needles, MD;  Location: [place];  Service: Orthopedics;  Laterality: Left;  . Nissem  2014  . TIBIA IM NAIL INSERTION Left 04/10/2020   Procedure: INTRAMEDULLARY (IM) NAIL TIBIAL;  Surgeon: Shona Needles, MD;  Location: [place];  Service: Orthopedics;  Laterality: Left;    There were no vitals filed for this visit.   Subjective Assessment - 07/16/20 1428    Subjective Pt reports some ankle pain, 3/10. Reports she had stitches removed this AM from MD and everything looks good and healing well. pt looking into alternatives for boot brace.    Pertinent History Pt is 65 y.o s/p  initial I&D with external fixation 04/07/20, then transitioned to ORIF with IM nail of left proximal tibia, and distal tibia/fibula 04/10/20. Subsequent irrigation and drainage of wound 06/11/20. Currently in post op shoe, reporting that surgeon advised her to seek PT opinion on less restrictive shoe option. Is currently unable to wear normal shoes due to swelling. Wearing boot throughout entire day, using RW for ambulation (previously ind without AD), is WBAT per MD notes- though patient reports she cannot tolerate hardly any pressure at this time, and per patient without any other surgical  precautions. Is having stitches removed 07/16/20. Uses a bone stimulator multiple times per day (per Xrays delayed healing of distal tibia and fibula fracture). Pain is currently 2/10 worst 4/10. Pain is aggrevated mainly with pronlonged walking. Pain is eased with ice and rest and motrin prn. Pain in the morning is fine and only gets worst depending on amount of walking done around the house throughout the day. Activities impaired cooking, cleaning, grocery shopping, bathing, lifting. Pt is  currently retired. Pt would like to get back to normal walking without any assisstive device. Denies any N/T, B/B changes, weight changes, night sweats/pain.    Limitations Walking;House hold activities    How long can you sit comfortably? unlimited    How long can you stand comfortably? unlimited    How long can you walk comfortably? 1-2 minutes    Patient Stated Goals Get back to normal walking without an assisstive device    Pain Onset More than a month ago           Therex   Nustep LE 7 UE 9 Level 1 for gentle motion and strengthening   Baps board w/ middle setting 3x12 DF <> PF; 3x12 inversion <> eversion; cuing to maintain contact with board and not to initiate movement using hip with  carry over    Seated PF 3x10 w/ 7.5# w/ AW around distal thigh; cuing to not lean torso back with good carry over   Standing weight shifting 4x10 2-3 sec hold to L foot ; cuing to not initiate movement w/ shoulders    Manual Therapy   Grade 3 mobilizations of talocrural joint using Posterior/Anterier/ Lateral/ Medial glides   PROM stretch of DF/PF/Inv/Ever                          PT Education - 07/16/20 1434    Education Details therex form/technique, HEP review    Person(s) Educated Patient    Methods Explanation;Demonstration;Tactile cues    Comprehension Verbalized understanding;Returned demonstration;Verbal cues required            PT Short Term Goals - 07/09/20 1023      PT SHORT TERM GOAL #1   Title Pt will be independent with HEP in order to increase ankle mobility and strength and balance in order to perform household ADLs safely.    Baseline 07/09/20 HEP given    Time 4    Period Weeks    Status New             PT Long Term Goals - 07/09/20 1025      PT LONG TERM GOAL #1   Title Pt will increase gait speed to at least 0.44m/s to demonstrate safe, normal community speed    Baseline 07/09/20 0.31    Time 8    Period Weeks    Status New       PT LONG TERM GOAL #2   Title Pt will demonstrate at least 10 LLE heel raises to demonstrate baseline PF strength in order to particpate in safe community activities and household ADLs    Baseline 07/09/20 R -10 L - unable to test    Time 8    Period Weeks    Status New      PT LONG TERM GOAL #3   Title Pt will increase L SL stance to at least 8secs in order to demonstrate baseline static balance, needed to demonstrate decreased fall risk  Baseline 07/09/20 R 8 sec L - unable to test    Time 8    Period Weeks    Status New      PT LONG TERM GOAL #4   Title Pt will demonstrate ind ambulation without AD with normalized gait mechanics to demonstrate return to PLOF and safety with community ambulation.    Baseline 07/09/20 with RW: step to gait with minimal RLE stance, decreased LLE step length, minimal WB on RLE    Time 8    Period Weeks    Status New      PT LONG TERM GOAL #5   Title Pt will increase L ankle DF A/PROM to at least 20 degrees to demonstrate normalized ankle mobility need for functional squatting and normalized gait mechanics    Baseline 07/09/20 A- 9d P- 11d    Time 8    Period Weeks    Status New                 Plan - 07/16/20 1436    Clinical Impression Statement PT continued therex for increased L ankle mobility and strength for carry over into independent walking w/o using an assistive device. Pt was able to perform therex with proper technique following demonstration and cuing. Pt tolerated therex and increases in joint mobilizations with min increases in pain. PT reinforced HEP compliance for increased WB of L foot with pt returning verbalized understanding. Pt had good motivation throughout session. PT will continue to progress as able.    Personal Factors and Comorbidities Fitness;Past/Current Experience;Comorbidity 1    Examination-Activity Limitations Bathing;Lift;Locomotion Level;Stand;Stairs;Other    Examination-Participation Restrictions  Cleaning;Shop;Community Activity    Stability/Clinical Decision Making Evolving/Moderate complexity    Clinical Decision Making Moderate    Rehab Potential Good    PT Frequency 2x / week    PT Duration 8 weeks    PT Treatment/Interventions ADLs/Self Care Home Management;Biofeedback;Cryotherapy;Ultrasound;Moist Heat;Iontophoresis 4mg /ml Dexamethasone;Electrical Stimulation;Gait training;Stair training;Functional mobility training;Neuromuscular re-education;Balance training;Therapeutic exercise;Therapeutic activities;Patient/family education;Orthotic Fit/Training;Manual techniques;Dry needling;Passive range of motion;Scar mobilization;Compression bandaging;Manual lymph drainage;Energy conservation;Taping    PT Next Visit Plan HEP review, initiate POC    PT Home Exercise Plan SL stance, dorsiflexion stetch, seated plantar flexion    Consulted and Agree with Plan of Care Patient           Patient will benefit from skilled therapeutic intervention in order to improve the following deficits and impairments:  Abnormal gait,Decreased activity tolerance,Decreased balance,Decreased endurance,Decreased coordination,Decreased mobility,Decreased range of motion,Difficulty walking,Decreased strength,Decreased scar mobility,Decreased skin integrity,Hypomobility,Increased edema,Impaired flexibility,Increased fascial restricitons,Impaired sensation,Impaired tone,Impaired UE functional use,Pain,Improper body mechanics,Postural dysfunction  Visit Diagnosis: Pain in left ankle and joints of left foot  Stiffness of left ankle, not elsewhere classified     Problem List Patient Active Problem List   Diagnosis Date Noted  . Postoperative wound infection 06/11/2020  . Surgical site infection 06/11/2020  . Asthma   . GERD (gastroesophageal reflux disease)   . Hypertension   . Fracture of tibia and fibula, shaft, left, open type III, initial encounter 04/10/2020  . Displaced comminuted fracture of shaft of  left fibula, initial encounter for closed fracture 04/10/2020  . Fall from height of greater than 3 feet 04/10/2020  . Open displaced pilon fracture of left tibia 04/07/2020  . Hyperlipidemia   . Mild concentric left ventricular hypertrophy (LVH) 03/09/2019  . Essential hypertension 03/01/2019    Durwin Reges DPT Turner Daniels, SPT  Durwin Reges 07/16/2020, 4:15 PM  [place]  [facility] [address] [place], [address] Phone: [phone]   Fax:  [phone]  Name: Mi Balla MRN: [id number] Date of Birth: Mar 30, 1956

## 2020-07-17 ENCOUNTER — Encounter: Payer: Federal, State, Local not specified - PPO | Admitting: Physical Therapy

## 2020-07-18 ENCOUNTER — Ambulatory Visit: Payer: Federal, State, Local not specified - PPO | Admitting: Physical Therapy

## 2020-07-18 ENCOUNTER — Other Ambulatory Visit: Payer: Self-pay

## 2020-07-18 ENCOUNTER — Encounter: Payer: Self-pay | Admitting: Physical Therapy

## 2020-07-18 DIAGNOSIS — M25572 Pain in left ankle and joints of left foot: Secondary | ICD-10-CM

## 2020-07-18 DIAGNOSIS — M25672 Stiffness of left ankle, not elsewhere classified: Secondary | ICD-10-CM | POA: Diagnosis not present

## 2020-07-18 NOTE — Therapy (Signed)
[place] [facility] [address], [place], [address] Phone: [phone]   Fax:  [phone]  Physical Therapy Treatment  Patient Details  Name: Kendra Allison MRN: [id number] Date of Birth: 09-Dec-1955 No data recorded  Encounter Date: 07/18/2020   PT End of Session - 07/18/20 1522    Visit Number 4    Number of Visits 17    Date for PT Re-Evaluation 09/03/20    Authorization - Visit Number 4    Authorization - Number of Visits 17    PT Start Time 3846    PT Stop Time 6599    PT Time Calculation (min) 40 min    Equipment Utilized During Treatment Gait belt;Other (comment)    Activity Tolerance Patient tolerated treatment well    Behavior During Therapy [facility] for tasks assessed/performed           Past Medical History:  Diagnosis Date  . Asthma   . GERD (gastroesophageal reflux disease)   . Hypercholesteremia   . Hyperlipidemia   . Hypertension   . Osteoporosis     Past Surgical History:  Procedure Laterality Date  . ABDOMINAL HYSTERECTOMY    . BREAST BIOPSY    . COLONOSCOPY WITH PROPOFOL N/A 08/10/2019   Procedure: COLONOSCOPY WITH PROPOFOL;  Surgeon: Jonathon Bellows, MD;  Location: [facility] ENDOSCOPY;  Service: Gastroenterology;  Laterality: N/A;  . ESOPHAGOGASTRODUODENOSCOPY (EGD) WITH PROPOFOL N/A 08/10/2019   Procedure: ESOPHAGOGASTRODUODENOSCOPY (EGD) WITH PROPOFOL;  Surgeon: Jonathon Bellows, MD;  Location: [facility] ENDOSCOPY;  Service: Gastroenterology;  Laterality: N/A;  . EXTERNAL FIXATION LEG Left 04/07/2020    EXTERNAL FIXATION LEG (Left Leg Lower)  . EXTERNAL FIXATION LEG Left 04/07/2020   Procedure: EXTERNAL FIXATION LEG;  Surgeon: Shona Needles, MD;  Location: [place];  Service: Orthopedics;  Laterality: Left;  . EXTERNAL FIXATION REMOVAL Left 04/10/2020   Procedure: REMOVAL EXTERNAL FIXATION LEG;  Surgeon: Shona Needles, MD;  Location: [place];  Service: Orthopedics;  Laterality: Left;  . I & D EXTREMITY  Left 04/07/2020   Procedure: IRRIGATION AND DEBRIDEMENT EXTREMITY;  Surgeon: Shona Needles, MD;  Location: [place];  Service: Orthopedics;  Laterality: Left;  . I & D EXTREMITY Left 06/12/2020   Procedure: IRRIGATION AND DEBRIDEMENT EXTREMITY;  Surgeon: Shona Needles, MD;  Location: [place];  Service: Orthopedics;  Laterality: Left;  . Nissem  2014  . TIBIA IM NAIL INSERTION Left 04/10/2020   Procedure: INTRAMEDULLARY (IM) NAIL TIBIAL;  Surgeon: Shona Needles, MD;  Location: [place];  Service: Orthopedics;  Laterality: Left;    There were no vitals filed for this visit.   Subjective Assessment - 07/18/20 1519    Subjective Pt reports some ankle pain today, 2/10 otherwise doing well. Has been icing ankle and reports it has been helping with the swelling. Recently got a new shoe to wear instead of boot.    Pertinent History Pt is 65 y.o s/p  initial I&D with external fixation 04/07/20, then transitioned to ORIF with IM nail of left proximal tibia, and distal tibia/fibula 04/10/20. Subsequent irrigation and drainage of wound 06/11/20. Currently in post op shoe, reporting that surgeon advised her to seek PT opinion on less restrictive shoe option. Is currently unable to wear normal shoes due to swelling. Wearing boot throughout entire day, using RW for ambulation (previously ind without AD), is WBAT per MD notes- though patient reports she cannot tolerate hardly any pressure at this time, and per patient  without any other surgical precautions. Is having stitches removed 07/16/20. Uses a bone stimulator multiple times per day (per Xrays delayed healing of distal tibia and fibula fracture). Pain is currently 2/10 worst 4/10. Pain is aggrevated mainly with pronlonged walking. Pain is eased with ice and rest and motrin prn. Pain in the morning is fine and only gets worst depending on amount of walking done around the house throughout the day. Activities impaired cooking, cleaning, grocery shopping, bathing,  lifting. Pt is currently retired. Pt would like to get back to normal walking without any assisstive device. Denies any N/T, B/B changes, weight changes, night sweats/pain.    Limitations Walking;House hold activities    How long can you sit comfortably? unlimited    How long can you stand comfortably? unlimited    How long can you walk comfortably? 1-2 minutes    Patient Stated Goals Get back to normal walking without an assisstive device    Pain Onset More than a month ago           Therex   Nustep LE 7  Level 0  for gentle motion and strengthening; no use of UE to promote weightbearing of LLE   Baps board w/ middle setting 3x12 DF <> PF; 3x12 inversion <> eversion; cuing to maintain contact with board and not to initiate movement using hip with  carry over  Step into mini lunge LLE 3x10; to promote increased weightbearing; walker in front for balance  Seated PF 3x10 w/ 7.5# w/ AW around distal thigh; cuing to not lean torso back with good carry over   Standing weight shifting x15 2-3 sec hold to L foot ; cuing to not initiate movement w/ shoulders and for side to side motion   Manual Therapy   Grade 3 mobilizations of talocrural joint using Posterior/Anterier/ Lateral/ Medial glides   PROM stretch of DF/PF/Inv/Ever                          PT Education - 07/18/20 1521    Education Details therex form/technique    Person(s) Educated Patient    Methods Explanation;Demonstration;Verbal cues;Tactile cues    Comprehension Verbalized understanding;Returned demonstration;Verbal cues required            PT Short Term Goals - 07/09/20 1023      PT SHORT TERM GOAL #1   Title Pt will be independent with HEP in order to increase ankle mobility and strength and balance in order to perform household ADLs safely.    Baseline 07/09/20 HEP given    Time 4    Period Weeks    Status New             PT Long Term Goals - 07/09/20 1025      PT LONG TERM GOAL  #1   Title Pt will increase gait speed to at least 0.28m/s to demonstrate safe, normal community speed    Baseline 07/09/20 0.31    Time 8    Period Weeks    Status New      PT LONG TERM GOAL #2   Title Pt will demonstrate at least 10 LLE heel raises to demonstrate baseline PF strength in order to particpate in safe community activities and household ADLs    Baseline 07/09/20 R -10 L - unable to test    Time 8    Period Weeks    Status New      PT LONG TERM  GOAL #3   Title Pt will increase L SL stance to at least 8secs in order to demonstrate baseline static balance, needed to demonstrate decreased fall risk    Baseline 07/09/20 R 8 sec L - unable to test    Time 8    Period Weeks    Status New      PT LONG TERM GOAL #4   Title Pt will demonstrate ind ambulation without AD with normalized gait mechanics to demonstrate return to PLOF and safety with community ambulation.    Baseline 07/09/20 with RW: step to gait with minimal RLE stance, decreased LLE step length, minimal WB on RLE    Time 8    Period Weeks    Status New      PT LONG TERM GOAL #5   Title Pt will increase L ankle DF A/PROM to at least 20 degrees to demonstrate normalized ankle mobility need for functional squatting and normalized gait mechanics    Baseline 07/09/20 A- 9d P- 11d    Time 8    Period Weeks    Status New                 Plan - 07/18/20 1523    Clinical Impression Statement PT continued therex for increased L anke mobility and strength for carry over into independent walking w/o using an assistive device. Pt performed therex with proper technique following demonstration and cuing.Pt continues to tolerate increased joint mobilizations with noticable improvements in motion and some increases with weigth bearing tolerance to left foot. Pt had no increases in pain levels and continued good motivation throughout. PT will to progress as able.    Personal Factors and Comorbidities Fitness;Past/Current  Experience;Comorbidity 1    Examination-Activity Limitations Bathing;Lift;Locomotion Level;Stand;Stairs;Other    Examination-Participation Restrictions Cleaning;Shop;Community Activity    Stability/Clinical Decision Making Evolving/Moderate complexity    Clinical Decision Making Moderate    Rehab Potential Good    PT Frequency 2x / week    PT Duration 8 weeks    PT Treatment/Interventions ADLs/Self Care Home Management;Biofeedback;Cryotherapy;Ultrasound;Moist Heat;Iontophoresis 4mg /ml Dexamethasone;Electrical Stimulation;Gait training;Stair training;Functional mobility training;Neuromuscular re-education;Balance training;Therapeutic exercise;Therapeutic activities;Patient/family education;Orthotic Fit/Training;Manual techniques;Dry needling;Passive range of motion;Scar mobilization;Compression bandaging;Manual lymph drainage;Energy conservation;Taping    PT Next Visit Plan HEP review, initiate POC    PT Home Exercise Plan SL stance, dorsiflexion stetch, seated plantar flexion    Consulted and Agree with Plan of Care Patient           Patient will benefit from skilled therapeutic intervention in order to improve the following deficits and impairments:  Abnormal gait,Decreased activity tolerance,Decreased balance,Decreased endurance,Decreased coordination,Decreased mobility,Decreased range of motion,Difficulty walking,Decreased strength,Decreased scar mobility,Decreased skin integrity,Hypomobility,Increased edema,Impaired flexibility,Increased fascial restricitons,Impaired sensation,Impaired tone,Impaired UE functional use,Pain,Improper body mechanics,Postural dysfunction  Visit Diagnosis: Pain in left ankle and joints of left foot  Stiffness of left ankle, not elsewhere classified     Problem List Patient Active Problem List   Diagnosis Date Noted  . Postoperative wound infection 06/11/2020  . Surgical site infection 06/11/2020  . Asthma   . GERD (gastroesophageal reflux disease)   .  Hypertension   . Fracture of tibia and fibula, shaft, left, open type III, initial encounter 04/10/2020  . Displaced comminuted fracture of shaft of left fibula, initial encounter for closed fracture 04/10/2020  . Fall from height of greater than 3 feet 04/10/2020  . Open displaced pilon fracture of left tibia 04/07/2020  . Hyperlipidemia   . Mild concentric left ventricular hypertrophy (LVH) 03/09/2019  .  Essential hypertension 03/01/2019    Durwin Reges DPT Turner Daniels, SPT  Durwin Reges 07/18/2020, 5:28 PM  [place] [facility] [address], [place], [address] Phone: [phone]   Fax:  [phone]  Name: Kendra Allison MRN: [id number] Date of Birth: April 03, 1956

## 2020-07-23 ENCOUNTER — Ambulatory Visit: Payer: Federal, State, Local not specified - PPO | Attending: Student | Admitting: Physical Therapy

## 2020-07-23 ENCOUNTER — Encounter: Payer: Self-pay | Admitting: Physical Therapy

## 2020-07-23 ENCOUNTER — Other Ambulatory Visit: Payer: Self-pay

## 2020-07-23 DIAGNOSIS — M79662 Pain in left lower leg: Secondary | ICD-10-CM | POA: Diagnosis not present

## 2020-07-23 DIAGNOSIS — M25572 Pain in left ankle and joints of left foot: Secondary | ICD-10-CM | POA: Insufficient documentation

## 2020-07-23 DIAGNOSIS — M25672 Stiffness of left ankle, not elsewhere classified: Secondary | ICD-10-CM | POA: Diagnosis not present

## 2020-07-23 DIAGNOSIS — S82302B Unspecified fracture of lower end of left tibia, initial encounter for open fracture type I or II: Secondary | ICD-10-CM | POA: Diagnosis not present

## 2020-07-23 NOTE — Therapy (Signed)
[facility] [address], [place], [address] Phone: [phone]   Fax:  [phone]  Physical Therapy Treatment  Patient Details  Name: Kendra Allison MRN: [id number] Date of Birth: [birth year] No data recorded  Encounter Date: 07/23/2020   PT End of Session - 07/23/20 1516    Visit Number 5    Number of Visits 17    Date for PT Re-Evaluation 09/03/20    Authorization - Visit Number 5    Authorization - Number of Visits 17    PT Start Time 7867    PT Stop Time 1550    PT Time Calculation (min) 40 min    Equipment Utilized During Treatment Gait belt;Other (comment)    Activity Tolerance Patient tolerated treatment well    Behavior During Therapy [facility] for tasks assessed/performed           Past Medical History:  Diagnosis Date  . Asthma   . GERD (gastroesophageal reflux disease)   . Hypercholesteremia   . Hyperlipidemia   . Hypertension   . Osteoporosis     Past Surgical History:  Procedure Laterality Date  . ABDOMINAL HYSTERECTOMY    . BREAST BIOPSY    . COLONOSCOPY WITH PROPOFOL N/A 08/10/2019   Procedure: COLONOSCOPY WITH PROPOFOL;  Surgeon: Jonathon Bellows, MD;  Location: [facility] ENDOSCOPY;  Service: Gastroenterology;  Laterality: N/A;  . ESOPHAGOGASTRODUODENOSCOPY (EGD) WITH PROPOFOL N/A 08/10/2019   Procedure: ESOPHAGOGASTRODUODENOSCOPY (EGD) WITH PROPOFOL;  Surgeon: Jonathon Bellows, MD;  Location: [facility] ENDOSCOPY;  Service: Gastroenterology;  Laterality: N/A;  . EXTERNAL FIXATION LEG Left 04/07/2020    EXTERNAL FIXATION LEG (Left Leg Lower)  . EXTERNAL FIXATION LEG Left 04/07/2020   Procedure: EXTERNAL FIXATION LEG;  Surgeon: Shona Needles, MD;  Location: [place];  Service: Orthopedics;  Laterality: Left;  . EXTERNAL FIXATION REMOVAL Left 04/10/2020   Procedure: REMOVAL EXTERNAL FIXATION LEG;  Surgeon: Shona Needles, MD;  Location: [place];  Service: Orthopedics;  Laterality: Left;  . I & D EXTREMITY  Left 04/07/2020   Procedure: IRRIGATION AND DEBRIDEMENT EXTREMITY;  Surgeon: Shona Needles, MD;  Location: [place];  Service: Orthopedics;  Laterality: Left;  . I & D EXTREMITY Left 06/12/2020   Procedure: IRRIGATION AND DEBRIDEMENT EXTREMITY;  Surgeon: Shona Needles, MD;  Location: [place];  Service: Orthopedics;  Laterality: Left;  . Nissem  2014  . TIBIA IM NAIL INSERTION Left 04/10/2020   Procedure: INTRAMEDULLARY (IM) NAIL TIBIAL;  Surgeon: Shona Needles, MD;  Location: [place];  Service: Orthopedics;  Laterality: Left;    There were no vitals filed for this visit.   Subjective Assessment - 07/23/20 1511    Subjective Pt reports 2/10 anke pain and 3/10 at proximal lunch. Still has some swelling around the ankle and has been continuing to ice it. Continued compliance with HEP.    Pertinent History Pt is 65 y.o s/p  initial I&D with external fixation 04/07/20, then transitioned to ORIF with IM nail of left proximal tibia, and distal tibia/fibula 04/10/20. Subsequent irrigation and drainage of wound 06/11/20. Currently in post op shoe, reporting that surgeon advised her to seek PT opinion on less restrictive shoe option. Is currently unable to wear normal shoes due to swelling. Wearing boot throughout entire day, using RW for ambulation (previously ind without AD), is WBAT per MD notes- though patient reports she cannot tolerate hardly any pressure at this time, and per patient without any other surgical precautions.  Is having stitches removed 07/16/20. Uses a bone stimulator multiple times per day (per Xrays delayed healing of distal tibia and fibula fracture). Pain is currently 2/10 worst 4/10. Pain is aggrevated mainly with pronlonged walking. Pain is eased with ice and rest and motrin prn. Pain in the morning is fine and only gets worst depending on amount of walking done around the house throughout the day. Activities impaired cooking, cleaning, grocery shopping, bathing, lifting. Pt is currently  retired. Pt would like to get back to normal walking without any assisstive device. Denies any N/T, B/B changes, weight changes, night sweats/pain.    Limitations Walking;House hold activities    How long can you sit comfortably? unlimited    How long can you stand comfortably? unlimited    How long can you walk comfortably? 1-2 minutes    Patient Stated Goals Get back to normal walking without an assisstive device    Pain Onset More than a month ago           Therex   Nustep LE 7  Level 0  for gentle motion and strengthening; no use of UE to promote weightbearing of LLE   Baps board w/ middle setting 3x12 DF <> PF; 3x12 inversion <> eversion; cuing to maintain contact with board and not to initiate movement using hip with  carry over   Manual Therapy   Grade 3 mobilizations of talocrural joint using Posterior/Anterier/ Lateral/ Medial glides   PROM stretch of DF/PF/Inv/Ever  Gait Training  Walking using SPC with focus on step through pattern; CGA using gait belt                             PT Education - 07/23/20 1515    Education Details therex form/technique    Person(s) Educated Patient    Methods Explanation;Demonstration;Verbal cues;Tactile cues    Comprehension Verbalized understanding;Verbal cues required;Returned demonstration            PT Short Term Goals - 07/09/20 1023      PT SHORT TERM GOAL #1   Title Pt will be independent with HEP in order to increase ankle mobility and strength and balance in order to perform household ADLs safely.    Baseline 07/09/20 HEP given    Time 4    Period Weeks    Status New             PT Long Term Goals - 07/09/20 1025      PT LONG TERM GOAL #1   Title Pt will increase gait speed to at least 0.34m/s to demonstrate safe, normal community speed    Baseline 07/09/20 0.31    Time 8    Period Weeks    Status New      PT LONG TERM GOAL #2   Title Pt will demonstrate at least 10 LLE heel raises  to demonstrate baseline PF strength in order to particpate in safe community activities and household ADLs    Baseline 07/09/20 R -10 L - unable to test    Time 8    Period Weeks    Status New      PT LONG TERM GOAL #3   Title Pt will increase L SL stance to at least 8secs in order to demonstrate baseline static balance, needed to demonstrate decreased fall risk    Baseline 07/09/20 R 8 sec L - unable to test    Time 8  Period Weeks    Status New      PT LONG TERM GOAL #4   Title Pt will demonstrate ind ambulation without AD with normalized gait mechanics to demonstrate return to PLOF and safety with community ambulation.    Baseline 07/09/20 with RW: step to gait with minimal RLE stance, decreased LLE step length, minimal WB on RLE    Time 8    Period Weeks    Status New      PT LONG TERM GOAL #5   Title Pt will increase L ankle DF A/PROM to at least 20 degrees to demonstrate normalized ankle mobility need for functional squatting and normalized gait mechanics    Baseline 07/09/20 A- 9d P- 11d    Time 8    Period Weeks    Status New                 Plan - 07/23/20 1534    Clinical Impression Statement PT continued therex for increased L ankle mobility and strength with initiation of gait training utilizing a SPC. Pt tolerated use of SPC with increased weight bearing to LLE with proper spc gait mechanics. Pt had no increases in pain and good motivation throughout session. PT will continue to progress as able.    Personal Factors and Comorbidities Fitness;Past/Current Experience;Comorbidity 1    Examination-Activity Limitations Bathing;Lift;Locomotion Level;Stand;Stairs;Other    Examination-Participation Restrictions Cleaning;Shop;Community Activity    Stability/Clinical Decision Making Evolving/Moderate complexity    Clinical Decision Making Moderate    Rehab Potential Good    PT Frequency 2x / week    PT Duration 8 weeks    PT Treatment/Interventions ADLs/Self Care Home  Management;Biofeedback;Cryotherapy;Ultrasound;Moist Heat;Iontophoresis 4mg /ml Dexamethasone;Electrical Stimulation;Gait training;Stair training;Functional mobility training;Neuromuscular re-education;Balance training;Therapeutic exercise;Therapeutic activities;Patient/family education;Orthotic Fit/Training;Manual techniques;Dry needling;Passive range of motion;Scar mobilization;Compression bandaging;Manual lymph drainage;Energy conservation;Taping    PT Next Visit Plan HEP review, initiate POC    PT Home Exercise Plan SL stance, dorsiflexion stetch, seated plantar flexion    Consulted and Agree with Plan of Care Patient           Patient will benefit from skilled therapeutic intervention in order to improve the following deficits and impairments:  Abnormal gait,Decreased activity tolerance,Decreased balance,Decreased endurance,Decreased coordination,Decreased mobility,Decreased range of motion,Difficulty walking,Decreased strength,Decreased scar mobility,Decreased skin integrity,Hypomobility,Increased edema,Impaired flexibility,Increased fascial restricitons,Impaired sensation,Impaired tone,Impaired UE functional use,Pain,Improper body mechanics,Postural dysfunction  Visit Diagnosis: Pain in left ankle and joints of left foot  Stiffness of left ankle, not elsewhere classified     Problem List Patient Active Problem List   Diagnosis Date Noted  . Postoperative wound infection 06/11/2020  . Surgical site infection 06/11/2020  . Asthma   . GERD (gastroesophageal reflux disease)   . Hypertension   . Fracture of tibia and fibula, shaft, left, open type III, initial encounter 04/10/2020  . Displaced comminuted fracture of shaft of left fibula, initial encounter for closed fracture 04/10/2020  . Fall from height of greater than 3 feet 04/10/2020  . Open displaced pilon fracture of left tibia 04/07/2020  . Hyperlipidemia   . Mild concentric left ventricular hypertrophy (LVH) 03/09/2019  .  Essential hypertension 03/01/2019    Durwin Reges DPT Turner Daniels, SPT  Durwin Reges 07/24/2020, 10:35 AM  [facility] [address] [place], [address] Phone: [phone]   Fax:  [phone]  Name: Cydnie Deason MRN: [id number] Date of Birth: [birth year]

## 2020-07-25 ENCOUNTER — Encounter: Payer: Self-pay | Admitting: Physical Therapy

## 2020-07-25 ENCOUNTER — Ambulatory Visit: Payer: Federal, State, Local not specified - PPO | Admitting: Physical Therapy

## 2020-07-25 ENCOUNTER — Other Ambulatory Visit: Payer: Self-pay

## 2020-07-25 DIAGNOSIS — M79662 Pain in left lower leg: Secondary | ICD-10-CM | POA: Diagnosis not present

## 2020-07-25 DIAGNOSIS — M25672 Stiffness of left ankle, not elsewhere classified: Secondary | ICD-10-CM | POA: Diagnosis not present

## 2020-07-25 DIAGNOSIS — M25572 Pain in left ankle and joints of left foot: Secondary | ICD-10-CM

## 2020-07-25 DIAGNOSIS — S82302B Unspecified fracture of lower end of left tibia, initial encounter for open fracture type I or II: Secondary | ICD-10-CM | POA: Diagnosis not present

## 2020-07-25 NOTE — Therapy (Signed)
[facility] [address] [place], [address] Phone: [phone]   Fax:  [phone]  Physical Therapy Treatment  Patient Details  Name: Kendra Allison MRN: [id number] Date of Birth: June 08, 1955 No data recorded  Encounter Date: 07/25/2020   PT End of Session - 07/25/20 1122    Visit Number 6    Number of Visits 17    Date for PT Re-Evaluation 09/03/20    Authorization - Visit Number 6    Authorization - Number of Visits 17    PT Start Time 5573    PT Stop Time 2202    PT Time Calculation (min) 38 min    Equipment Utilized During Treatment Gait belt;Other (comment)    Activity Tolerance Patient tolerated treatment well    Behavior During Therapy [facility] for tasks assessed/performed           Past Medical History:  Diagnosis Date  . Asthma   . GERD (gastroesophageal reflux disease)   . Hypercholesteremia   . Hyperlipidemia   . Hypertension   . Osteoporosis     Past Surgical History:  Procedure Laterality Date  . ABDOMINAL HYSTERECTOMY    . BREAST BIOPSY    . COLONOSCOPY WITH PROPOFOL N/A 08/10/2019   Procedure: COLONOSCOPY WITH PROPOFOL;  Surgeon: Jonathon Bellows, MD;  Location: [facility] ENDOSCOPY;  Service: Gastroenterology;  Laterality: N/A;  . ESOPHAGOGASTRODUODENOSCOPY (EGD) WITH PROPOFOL N/A 08/10/2019   Procedure: ESOPHAGOGASTRODUODENOSCOPY (EGD) WITH PROPOFOL;  Surgeon: Jonathon Bellows, MD;  Location: [facility] ENDOSCOPY;  Service: Gastroenterology;  Laterality: N/A;  . EXTERNAL FIXATION LEG Left 04/07/2020    EXTERNAL FIXATION LEG (Left Leg Lower)  . EXTERNAL FIXATION LEG Left 04/07/2020   Procedure: EXTERNAL FIXATION LEG;  Surgeon: Shona Needles, MD;  Location: [place];  Service: Orthopedics;  Laterality: Left;  . EXTERNAL FIXATION REMOVAL Left 04/10/2020   Procedure: REMOVAL EXTERNAL FIXATION LEG;  Surgeon: Shona Needles, MD;  Location: [place];  Service: Orthopedics;  Laterality: Left;  . I & D EXTREMITY  Left 04/07/2020   Procedure: IRRIGATION AND DEBRIDEMENT EXTREMITY;  Surgeon: Shona Needles, MD;  Location: [place];  Service: Orthopedics;  Laterality: Left;  . I & D EXTREMITY Left 06/12/2020   Procedure: IRRIGATION AND DEBRIDEMENT EXTREMITY;  Surgeon: Shona Needles, MD;  Location: [place];  Service: Orthopedics;  Laterality: Left;  . Nissem  2014  . TIBIA IM NAIL INSERTION Left 04/10/2020   Procedure: INTRAMEDULLARY (IM) NAIL TIBIAL;  Surgeon: Shona Needles, MD;  Location: [place];  Service: Orthopedics;  Laterality: Left;    There were no vitals filed for this visit.   Subjective Assessment - 07/25/20 1115    Subjective Pt reports leg pain is still 2/10. Some soreness in UE from heavy use with rolling walker. Continues to do HEP.    Pertinent History Pt is 65 y.o s/p  initial I&D with external fixation 04/07/20, then transitioned to ORIF with IM nail of left proximal tibia, and distal tibia/fibula 04/10/20. Subsequent irrigation and drainage of wound 06/11/20. Currently in post op shoe, reporting that surgeon advised her to seek PT opinion on less restrictive shoe option. Is currently unable to wear normal shoes due to swelling. Wearing boot throughout entire day, using RW for ambulation (previously ind without AD), is WBAT per MD notes- though patient reports she cannot tolerate hardly any pressure at this time, and per patient without any other surgical precautions. Is having stitches removed 07/16/20. Uses a  bone stimulator multiple times per day (per Xrays delayed healing of distal tibia and fibula fracture). Pain is currently 2/10 worst 4/10. Pain is aggrevated mainly with pronlonged walking. Pain is eased with ice and rest and motrin prn. Pain in the morning is fine and only gets worst depending on amount of walking done around the house throughout the day. Activities impaired cooking, cleaning, grocery shopping, bathing, lifting. Pt is currently retired. Pt would like to get back to normal  walking without any assisstive device. Denies any N/T, B/B changes, weight changes, night sweats/pain.    Limitations Walking;House hold activities    How long can you sit comfortably? unlimited    How long can you stand comfortably? unlimited    How long can you walk comfortably? 1-2 minutes    Patient Stated Goals Get back to normal walking without an assisstive device    Pain Onset More than a month ago           Therex   Nustep LE 7  Level 0  for gentle motion and strengthening; no use of UE to promote weightbearing of LLE   Baps board w/ middle setting 3x12 DF <> PF; 3x12 inversion <> eversion; cuing to maintain contact with board and not to initiate movement using hip with  carry over  Seated PF with 7.5# AW at distal thigh; 3x10    Manual Therapy   Grade 3-4 mobilizations of talocrural joint using Posterior/Anterier/ Lateral/ Medial glides   PROM stretch of DF/PF/Inv/Ever   Gait Training  Walking 300 ft using SPC with focus on step through pattern; CGA using gait belt; cones set out for obstacle negotiation                           PT Education - 07/25/20 1121    Education Details therex form/technique    Person(s) Educated Patient    Methods Explanation;Demonstration;Verbal cues;Tactile cues    Comprehension Verbalized understanding;Returned demonstration;Verbal cues required            PT Short Term Goals - 07/09/20 1023      PT SHORT TERM GOAL #1   Title Pt will be independent with HEP in order to increase ankle mobility and strength and balance in order to perform household ADLs safely.    Baseline 07/09/20 HEP given    Time 4    Period Weeks    Status New             PT Long Term Goals - 07/09/20 1025      PT LONG TERM GOAL #1   Title Pt will increase gait speed to at least 0.36m/s to demonstrate safe, normal community speed    Baseline 07/09/20 0.31    Time 8    Period Weeks    Status New      PT LONG TERM GOAL #2    Title Pt will demonstrate at least 10 LLE heel raises to demonstrate baseline PF strength in order to particpate in safe community activities and household ADLs    Baseline 07/09/20 R -10 L - unable to test    Time 8    Period Weeks    Status New      PT LONG TERM GOAL #3   Title Pt will increase L SL stance to at least 8secs in order to demonstrate baseline static balance, needed to demonstrate decreased fall risk    Baseline 07/09/20 R 8 sec L -  unable to test    Time 8    Period Weeks    Status New      PT LONG TERM GOAL #4   Title Pt will demonstrate ind ambulation without AD with normalized gait mechanics to demonstrate return to PLOF and safety with community ambulation.    Baseline 07/09/20 with RW: step to gait with minimal RLE stance, decreased LLE step length, minimal WB on RLE    Time 8    Period Weeks    Status New      PT LONG TERM GOAL #5   Title Pt will increase L ankle DF A/PROM to at least 20 degrees to demonstrate normalized ankle mobility need for functional squatting and normalized gait mechanics    Baseline 07/09/20 A- 9d P- 11d    Time 8    Period Weeks    Status New                 Plan - 07/25/20 1123    Clinical Impression Statement PT continued therex for increased L ankle mobility and strength for carry over into independent walking. Pt continues to tolerate increased graded joint mobilizations with noticable improved ranges of motion. Pt tolerates gait using SPC with correct step through pattern with no increases in pain. PT will continue to progress as able.    Personal Factors and Comorbidities Fitness;Past/Current Experience;Comorbidity 1    Examination-Activity Limitations Bathing;Lift;Locomotion Level;Stand;Stairs;Other    Examination-Participation Restrictions Cleaning;Shop;Community Activity    Stability/Clinical Decision Making Evolving/Moderate complexity    Clinical Decision Making Moderate    Rehab Potential Good    PT Frequency 2x / week     PT Duration 8 weeks    PT Treatment/Interventions ADLs/Self Care Home Management;Biofeedback;Cryotherapy;Ultrasound;Moist Heat;Iontophoresis 4mg /ml Dexamethasone;Electrical Stimulation;Gait training;Stair training;Functional mobility training;Neuromuscular re-education;Balance training;Therapeutic exercise;Therapeutic activities;Patient/family education;Orthotic Fit/Training;Manual techniques;Dry needling;Passive range of motion;Scar mobilization;Compression bandaging;Manual lymph drainage;Energy conservation;Taping    PT Next Visit Plan HEP review, initiate POC    PT Home Exercise Plan SL stance, dorsiflexion stetch, seated plantar flexion    Consulted and Agree with Plan of Care Patient           Patient will benefit from skilled therapeutic intervention in order to improve the following deficits and impairments:  Abnormal gait,Decreased activity tolerance,Decreased balance,Decreased endurance,Decreased coordination,Decreased mobility,Decreased range of motion,Difficulty walking,Decreased strength,Decreased scar mobility,Decreased skin integrity,Hypomobility,Increased edema,Impaired flexibility,Increased fascial restricitons,Impaired sensation,Impaired tone,Impaired UE functional use,Pain,Improper body mechanics,Postural dysfunction  Visit Diagnosis: Pain in left ankle and joints of left foot  Stiffness of left ankle, not elsewhere classified     Problem List Patient Active Problem List   Diagnosis Date Noted  . Postoperative wound infection 06/11/2020  . Surgical site infection 06/11/2020  . Asthma   . GERD (gastroesophageal reflux disease)   . Hypertension   . Fracture of tibia and fibula, shaft, left, open type III, initial encounter 04/10/2020  . Displaced comminuted fracture of shaft of left fibula, initial encounter for closed fracture 04/10/2020  . Fall from height of greater than 3 feet 04/10/2020  . Open displaced pilon fracture of left tibia 04/07/2020  .  Hyperlipidemia   . Mild concentric left ventricular hypertrophy (LVH) 03/09/2019  . Essential hypertension 03/01/2019    Durwin Reges DPT Turner Daniels, SPT  Durwin Reges 07/25/2020, 3:11 PM  [facility] [address], [place], [address] Phone: [phone]   Fax:  [phone]  Name: Kendra Allison MRN: [id number] Date of Birth: 03/21/1956

## 2020-07-29 ENCOUNTER — Other Ambulatory Visit: Payer: Self-pay

## 2020-07-29 ENCOUNTER — Encounter: Payer: Self-pay | Admitting: Physical Therapy

## 2020-07-29 ENCOUNTER — Ambulatory Visit: Payer: Federal, State, Local not specified - PPO | Admitting: Physical Therapy

## 2020-07-29 DIAGNOSIS — M25572 Pain in left ankle and joints of left foot: Secondary | ICD-10-CM

## 2020-07-29 DIAGNOSIS — S82302B Unspecified fracture of lower end of left tibia, initial encounter for open fracture type I or II: Secondary | ICD-10-CM | POA: Diagnosis not present

## 2020-07-29 DIAGNOSIS — M79662 Pain in left lower leg: Secondary | ICD-10-CM | POA: Diagnosis not present

## 2020-07-29 DIAGNOSIS — M25672 Stiffness of left ankle, not elsewhere classified: Secondary | ICD-10-CM | POA: Diagnosis not present

## 2020-07-29 NOTE — Therapy (Signed)
[place] [facility] [address] [place], [address] Phone: [phone]   Fax:  [phone]  Physical Therapy Treatment  Patient Details  Name: Kendra Allison MRN: [id number] Date of Birth: 1955/10/12 No data recorded  Encounter Date: 07/29/2020   PT End of Session - 07/29/20 1437    Visit Number 7    Number of Visits 17    Date for PT Re-Evaluation 09/03/20    Authorization - Visit Number 7    Authorization - Number of Visits 17    PT Start Time 0230    PT Stop Time 0310    PT Time Calculation (min) 40 min    Equipment Utilized During Treatment Gait belt;Other (comment)    Activity Tolerance Patient tolerated treatment well    Behavior During Therapy [facility] for tasks assessed/performed           Past Medical History:  Diagnosis Date  . Asthma   . GERD (gastroesophageal reflux disease)   . Hypercholesteremia   . Hyperlipidemia   . Hypertension   . Osteoporosis     Past Surgical History:  Procedure Laterality Date  . ABDOMINAL HYSTERECTOMY    . BREAST BIOPSY    . COLONOSCOPY WITH PROPOFOL N/A 08/10/2019   Procedure: COLONOSCOPY WITH PROPOFOL;  Surgeon: Jonathon Bellows, MD;  Location: [facility] ENDOSCOPY;  Service: Gastroenterology;  Laterality: N/A;  . ESOPHAGOGASTRODUODENOSCOPY (EGD) WITH PROPOFOL N/A 08/10/2019   Procedure: ESOPHAGOGASTRODUODENOSCOPY (EGD) WITH PROPOFOL;  Surgeon: Jonathon Bellows, MD;  Location: [facility] ENDOSCOPY;  Service: Gastroenterology;  Laterality: N/A;  . EXTERNAL FIXATION LEG Left 04/07/2020    EXTERNAL FIXATION LEG (Left Leg Lower)  . EXTERNAL FIXATION LEG Left 04/07/2020   Procedure: EXTERNAL FIXATION LEG;  Surgeon: Shona Needles, MD;  Location: [place];  Service: Orthopedics;  Laterality: Left;  . EXTERNAL FIXATION REMOVAL Left 04/10/2020   Procedure: REMOVAL EXTERNAL FIXATION LEG;  Surgeon: Shona Needles, MD;  Location: [place];  Service: Orthopedics;  Laterality: Left;  . I & D EXTREMITY  Left 04/07/2020   Procedure: IRRIGATION AND DEBRIDEMENT EXTREMITY;  Surgeon: Shona Needles, MD;  Location: [place];  Service: Orthopedics;  Laterality: Left;  . I & D EXTREMITY Left 06/12/2020   Procedure: IRRIGATION AND DEBRIDEMENT EXTREMITY;  Surgeon: Shona Needles, MD;  Location: [place];  Service: Orthopedics;  Laterality: Left;  . Nissem  2014  . TIBIA IM NAIL INSERTION Left 04/10/2020   Procedure: INTRAMEDULLARY (IM) NAIL TIBIAL;  Surgeon: Shona Needles, MD;  Location: [place];  Service: Orthopedics;  Laterality: Left;    There were no vitals filed for this visit.   Subjective Assessment - 07/29/20 1431    Subjective Pt reports minimal pain in the L lower leg reporting 3/10. She worked on walking with less pressure with her UEs over the weekend, and thinks she is doing well with it. Is completing HEP. Feels like her ankle is stiff.    Pertinent History Pt is 65 y.o s/p L initial I&D with external fixation 04/07/20, then transitioned to ORIF with IM nail of left proximal tibia, and distal tibia/fibula 04/10/20. Subsequent irrigation and drainage of wound 06/11/20. Currently in post op shoe, reporting that surgeon advised her to seek PT opinion on less restrictive shoe option. Is currently unable to wear normal shoes due to swelling. Wearing boot throughout entire day, using RW for ambulation (previously ind without AD), is WBAT per MD notes- though patient reports she cannot tolerate hardly  any pressure at this time, and per patient without any other surgical precautions. Is having stitches removed 07/16/20. Uses a bone stimulator multiple times per day (per Xrays delayed healing of distal tibia and fibula fracture). Pain is currently 2/10 worst 4/10. Pain is aggrevated mainly with pronlonged walking. Pain is eased with ice and rest and motrin prn. Pain in the morning is fine and only gets worst depending on amount of walking done around the house throughout the day. Activities impaired cooking,  cleaning, grocery shopping, bathing, lifting. Pt is currently retired. Pt would like to get back to normal walking without any assisstive device. Denies any N/T, B/B changes, weight changes, night sweats/pain.    Limitations Walking;House hold activities    How long can you sit comfortably? unlimited    How long can you stand comfortably? unlimited    How long can you walk comfortably? 1-2 minutes    Patient Stated Goals Get back to normal walking without an assisstive device    Pain Onset More than a month ago             Therex  Nustep LE 7 Level 3 for gentle motion and strengthening; no use of UE to promote weightbearing of LLE 31mins  LLE SLS 30sec with bilat UE support Gastroc stretch 2x 30sec (added to HEP)  TRX squat x10; with RLE on balance stone for increased LLE WB x10 with good carry over following demo for proper technique  Manual Therapy Grade 3-4 mobilizations of talocrural joint using Posterior/Anterier/ Lateral/ Medial glides  STM with trigger point release to gastroc and anterior tibialis with increased PF and DF following  Gait Training  LLE push off > heel strike with LLE weight acceptance (RLE heel raise) over and back 6in hurdle x15 to mimic motor pattern for ambulation for carry over into gait Walking 200 ft using SPC with focus on step through pattern; CGA > supervision using gait belt;                 PT Education - 07/29/20 1437    Education Details therex form/technique    Person(s) Educated Patient    Methods Explanation;Demonstration;Verbal cues    Comprehension Verbalized understanding;Returned demonstration;Verbal cues required            PT Short Term Goals - 07/09/20 1023      PT SHORT TERM GOAL #1   Title Pt will be independent with HEP in order to increase ankle mobility and strength and balance in order to perform household ADLs safely.    Baseline 07/09/20 HEP given    Time 4    Period Weeks    Status New              PT Long Term Goals - 07/09/20 1025      PT LONG TERM GOAL #1   Title Pt will increase gait speed to at least 0.43m/s to demonstrate safe, normal community speed    Baseline 07/09/20 0.31    Time 8    Period Weeks    Status New      PT LONG TERM GOAL #2   Title Pt will demonstrate at least 10 LLE heel raises to demonstrate baseline PF strength in order to particpate in safe community activities and household ADLs    Baseline 07/09/20 R -10 L - unable to test    Time 8    Period Weeks    Status New      PT LONG TERM  GOAL #3   Title Pt will increase L SL stance to at least 8secs in order to demonstrate baseline static balance, needed to demonstrate decreased fall risk    Baseline 07/09/20 R 8 sec L - unable to test    Time 8    Period Weeks    Status New      PT LONG TERM GOAL #4   Title Pt will demonstrate ind ambulation without AD with normalized gait mechanics to demonstrate return to PLOF and safety with community ambulation.    Baseline 07/09/20 with RW: step to gait with minimal RLE stance, decreased LLE step length, minimal WB on RLE    Time 8    Period Weeks    Status New      PT LONG TERM GOAL #5   Title Pt will increase L ankle DF A/PROM to at least 20 degrees to demonstrate normalized ankle mobility need for functional squatting and normalized gait mechanics    Baseline 07/09/20 A- 9d P- 11d    Time 8    Period Weeks    Status New                 Plan - 07/29/20 1439    Clinical Impression Statement PT continued therex progression for increased L ankle mobility, and lower leg strength with carry over into gait with success. Patient is able to comply with all cuing for proper technique of therex and gait training with increased LLE WB evident in therex and gait with SPC. Patient is motivated throughout session with no increased pain. PT will continue progression as able.    Personal Factors and Comorbidities Fitness;Past/Current Experience;Comorbidity 1     Examination-Activity Limitations Bathing;Lift;Locomotion Level;Stand;Stairs;Other    Examination-Participation Restrictions Cleaning;Shop;Community Activity    Stability/Clinical Decision Making Evolving/Moderate complexity    Clinical Decision Making Moderate    Rehab Potential Good    PT Frequency 2x / week    PT Duration 8 weeks    PT Treatment/Interventions ADLs/Self Care Home Management;Biofeedback;Cryotherapy;Ultrasound;Moist Heat;Iontophoresis 4mg /ml Dexamethasone;Electrical Stimulation;Gait training;Stair training;Functional mobility training;Neuromuscular re-education;Balance training;Therapeutic exercise;Therapeutic activities;Patient/family education;Orthotic Fit/Training;Manual techniques;Dry needling;Passive range of motion;Scar mobilization;Compression bandaging;Manual lymph drainage;Energy conservation;Taping    PT Next Visit Plan HEP review, initiate POC    PT Home Exercise Plan SL stance, dorsiflexion stetch, seated plantar flexion    Consulted and Agree with Plan of Care Patient           Patient will benefit from skilled therapeutic intervention in order to improve the following deficits and impairments:  Abnormal gait,Decreased activity tolerance,Decreased balance,Decreased endurance,Decreased coordination,Decreased mobility,Decreased range of motion,Difficulty walking,Decreased strength,Decreased scar mobility,Decreased skin integrity,Hypomobility,Increased edema,Impaired flexibility,Increased fascial restricitons,Impaired sensation,Impaired tone,Impaired UE functional use,Pain,Improper body mechanics,Postural dysfunction  Visit Diagnosis: Pain in left ankle and joints of left foot  Stiffness of left ankle, not elsewhere classified     Problem List Patient Active Problem List   Diagnosis Date Noted  . Postoperative wound infection 06/11/2020  . Surgical site infection 06/11/2020  . Asthma   . GERD (gastroesophageal reflux disease)   . Hypertension   .  Fracture of tibia and fibula, shaft, left, open type III, initial encounter 04/10/2020  . Displaced comminuted fracture of shaft of left fibula, initial encounter for closed fracture 04/10/2020  . Fall from height of greater than 3 feet 04/10/2020  . Open displaced pilon fracture of left tibia 04/07/2020  . Hyperlipidemia   . Mild concentric left ventricular hypertrophy (LVH) 03/09/2019  . Essential hypertension 03/01/2019   Durwin Reges  DPT Durwin Reges 07/29/2020, 3:17 PM  [place] [facility] [address] [place], [address] Phone: [phone]   Fax:  [phone]  Name: Kendra Allison MRN: [id number] Date of Birth: September 17, 1955

## 2020-07-31 ENCOUNTER — Other Ambulatory Visit: Payer: Self-pay

## 2020-07-31 ENCOUNTER — Encounter: Payer: Self-pay | Admitting: Physical Therapy

## 2020-07-31 ENCOUNTER — Ambulatory Visit: Payer: Federal, State, Local not specified - PPO | Admitting: Physical Therapy

## 2020-07-31 DIAGNOSIS — S82302B Unspecified fracture of lower end of left tibia, initial encounter for open fracture type I or II: Secondary | ICD-10-CM | POA: Diagnosis not present

## 2020-07-31 DIAGNOSIS — M79662 Pain in left lower leg: Secondary | ICD-10-CM | POA: Diagnosis not present

## 2020-07-31 DIAGNOSIS — M25572 Pain in left ankle and joints of left foot: Secondary | ICD-10-CM

## 2020-07-31 DIAGNOSIS — M25672 Stiffness of left ankle, not elsewhere classified: Secondary | ICD-10-CM | POA: Diagnosis not present

## 2020-07-31 NOTE — Therapy (Signed)
[place] [facility] [address] [place], [address] Phone: [phone]   Fax:  [phone]  Physical Therapy Treatment  Patient Details  Name: Kendra Allison MRN: [id number] Date of Birth: March 29, 1956 No data recorded  Encounter Date: 07/31/2020   PT End of Session - 07/31/20 1524    Visit Number 8    Number of Visits 17    Date for PT Re-Evaluation 09/03/20    Authorization - Visit Number 8    Authorization - Number of Visits 17    PT Start Time 0315    PT Stop Time 0400    PT Time Calculation (min) 45 min    Activity Tolerance Patient tolerated treatment well    Behavior During Therapy [facility] for tasks assessed/performed           Past Medical History:  Diagnosis Date  . Asthma   . GERD (gastroesophageal reflux disease)   . Hypercholesteremia   . Hyperlipidemia   . Hypertension   . Osteoporosis     Past Surgical History:  Procedure Laterality Date  . ABDOMINAL HYSTERECTOMY    . BREAST BIOPSY    . COLONOSCOPY WITH PROPOFOL N/A 08/10/2019   Procedure: COLONOSCOPY WITH PROPOFOL;  Surgeon: Jonathon Bellows, MD;  Location: [facility] ENDOSCOPY;  Service: Gastroenterology;  Laterality: N/A;  . ESOPHAGOGASTRODUODENOSCOPY (EGD) WITH PROPOFOL N/A 08/10/2019   Procedure: ESOPHAGOGASTRODUODENOSCOPY (EGD) WITH PROPOFOL;  Surgeon: Jonathon Bellows, MD;  Location: [facility] ENDOSCOPY;  Service: Gastroenterology;  Laterality: N/A;  . EXTERNAL FIXATION LEG Left 04/07/2020    EXTERNAL FIXATION LEG (Left Leg Lower)  . EXTERNAL FIXATION LEG Left 04/07/2020   Procedure: EXTERNAL FIXATION LEG;  Surgeon: Shona Needles, MD;  Location: [place];  Service: Orthopedics;  Laterality: Left;  . EXTERNAL FIXATION REMOVAL Left 04/10/2020   Procedure: REMOVAL EXTERNAL FIXATION LEG;  Surgeon: Shona Needles, MD;  Location: [place];  Service: Orthopedics;  Laterality: Left;  . I & D EXTREMITY Left 04/07/2020   Procedure: IRRIGATION AND DEBRIDEMENT EXTREMITY;   Surgeon: Shona Needles, MD;  Location: [place];  Service: Orthopedics;  Laterality: Left;  . I & D EXTREMITY Left 06/12/2020   Procedure: IRRIGATION AND DEBRIDEMENT EXTREMITY;  Surgeon: Shona Needles, MD;  Location: [place];  Service: Orthopedics;  Laterality: Left;  . Nissem  2014  . TIBIA IM NAIL INSERTION Left 04/10/2020   Procedure: INTRAMEDULLARY (IM) NAIL TIBIAL;  Surgeon: Shona Needles, MD;  Location: [place];  Service: Orthopedics;  Laterality: Left;    There were no vitals filed for this visit.   Subjective Assessment - 07/31/20 1522    Subjective Pt reports 2/10 pain today. Reports she has not been completing her stretches, but has been trying to massage her leg. Brings hurricane into clinic that needs adjustments.    Pertinent History Pt is 65 y.o s/p L initial I&D with external fixation 04/07/20, then transitioned to ORIF with IM nail of left proximal tibia, and distal tibia/fibula 04/10/20. Subsequent irrigation and drainage of wound 06/11/20. Currently in post op shoe, reporting that surgeon advised her to seek PT opinion on less restrictive shoe option. Is currently unable to wear normal shoes due to swelling. Wearing boot throughout entire day, using RW for ambulation (previously ind without AD), is WBAT per MD notes- though patient reports she cannot tolerate hardly any pressure at this time, and per patient without any other surgical precautions. Is having stitches removed 07/16/20. Uses a bone stimulator multiple  times per day (per Xrays delayed healing of distal tibia and fibula fracture). Pain is currently 2/10 worst 4/10. Pain is aggrevated mainly with pronlonged walking. Pain is eased with ice and rest and motrin prn. Pain in the morning is fine and only gets worst depending on amount of walking done around the house throughout the day. Activities impaired cooking, cleaning, grocery shopping, bathing, lifting. Pt is currently retired. Pt would like to get back to normal walking  without any assisstive device. Denies any N/T, B/B changes, weight changes, night sweats/pain.    Limitations Walking;House hold activities    How long can you sit comfortably? unlimited    How long can you stand comfortably? unlimited    How long can you walk comfortably? 1-2 minutes    Patient Stated Goals Get back to normal walking without an assisstive device    Pain Onset More than a month ago           Therex  Nustep LE 7 Level 3 for gentle motion and strengthening; no use of UE to promote weightbearing of LLE 90mins  TRX squat with RLE on balance stone for increased LLE WB 3 x10 with good carry over following demo for proper technique  PF on OMEGA leg press 35# 2x 10  Standing bilat heel raise x10 about 50% of full lift  Standing toe raise 2x 10 with minimal lift, cuing to prevent posterior hip movement  Gastroc stretch x30sec   Manual Therapy Grade 3-4 mobilizations of talocrural joint using Posterior/Anterier/ Lateral/ Medial glides  STM with trigger point release to gastroc and anterior tibialis with increased PF and DF following                             PT Education - 07/31/20 1524    Education Details therex form/technique    Person(s) Educated Patient    Methods Explanation;Demonstration;Verbal cues    Comprehension Verbalized understanding;Returned demonstration;Verbal cues required            PT Short Term Goals - 07/09/20 1023      PT SHORT TERM GOAL #1   Title Pt will be independent with HEP in order to increase ankle mobility and strength and balance in order to perform household ADLs safely.    Baseline 07/09/20 HEP given    Time 4    Period Weeks    Status New             PT Long Term Goals - 07/09/20 1025      PT LONG TERM GOAL #1   Title Pt will increase gait speed to at least 0.23m/s to demonstrate safe, normal community speed    Baseline 07/09/20 0.31    Time 8    Period Weeks    Status New      PT  LONG TERM GOAL #2   Title Pt will demonstrate at least 10 LLE heel raises to demonstrate baseline PF strength in order to particpate in safe community activities and household ADLs    Baseline 07/09/20 R -10 L - unable to test    Time 8    Period Weeks    Status New      PT LONG TERM GOAL #3   Title Pt will increase L SL stance to at least 8secs in order to demonstrate baseline static balance, needed to demonstrate decreased fall risk    Baseline 07/09/20 R 8 sec L - unable  to test    Time 8    Period Weeks    Status New      PT LONG TERM GOAL #4   Title Pt will demonstrate ind ambulation without AD with normalized gait mechanics to demonstrate return to PLOF and safety with community ambulation.    Baseline 07/09/20 with RW: step to gait with minimal RLE stance, decreased LLE step length, minimal WB on RLE    Time 8    Period Weeks    Status New      PT LONG TERM GOAL #5   Title Pt will increase L ankle DF A/PROM to at least 20 degrees to demonstrate normalized ankle mobility need for functional squatting and normalized gait mechanics    Baseline 07/09/20 A- 9d P- 11d    Time 8    Period Weeks    Status New                 Plan - 07/31/20 1551    Clinical Impression Statement PT continued therex progression for increased L ankle mobility and lower leg strength with success. PT monitored ambulation around clinic with [facility] with intermittent cuing for increased RLE step length. Patient is able to comply with all cuing for proper technique of therex and is motivated throughout session. PT will continue progression as able.    Personal Factors and Comorbidities Fitness;Past/Current Experience;Comorbidity 1    Examination-Activity Limitations Bathing;Lift;Locomotion Level;Stand;Stairs;Other    Examination-Participation Restrictions Cleaning;Shop;Community Activity    Stability/Clinical Decision Making Evolving/Moderate complexity    Clinical Decision Making Moderate    Rehab  Potential Good    PT Frequency 2x / week    PT Duration 8 weeks    PT Treatment/Interventions ADLs/Self Care Home Management;Biofeedback;Cryotherapy;Ultrasound;Moist Heat;Iontophoresis 4mg /ml Dexamethasone;Electrical Stimulation;Gait training;Stair training;Functional mobility training;Neuromuscular re-education;Balance training;Therapeutic exercise;Therapeutic activities;Patient/family education;Orthotic Fit/Training;Manual techniques;Dry needling;Passive range of motion;Scar mobilization;Compression bandaging;Manual lymph drainage;Energy conservation;Taping    PT Next Visit Plan HEP review, initiate POC    PT Home Exercise Plan SL stance, dorsiflexion stetch, seated plantar flexion    Consulted and Agree with Plan of Care Patient           Patient will benefit from skilled therapeutic intervention in order to improve the following deficits and impairments:  Abnormal gait,Decreased activity tolerance,Decreased balance,Decreased endurance,Decreased coordination,Decreased mobility,Decreased range of motion,Difficulty walking,Decreased strength,Decreased scar mobility,Decreased skin integrity,Hypomobility,Increased edema,Impaired flexibility,Increased fascial restricitons,Impaired sensation,Impaired tone,Impaired UE functional use,Pain,Improper body mechanics,Postural dysfunction  Visit Diagnosis: Pain in left ankle and joints of left foot  Stiffness of left ankle, not elsewhere classified     Problem List Patient Active Problem List   Diagnosis Date Noted  . Postoperative wound infection 06/11/2020  . Surgical site infection 06/11/2020  . Asthma   . GERD (gastroesophageal reflux disease)   . Hypertension   . Fracture of tibia and fibula, shaft, left, open type III, initial encounter 04/10/2020  . Displaced comminuted fracture of shaft of left fibula, initial encounter for closed fracture 04/10/2020  . Fall from height of greater than 3 feet 04/10/2020  . Open displaced pilon fracture  of left tibia 04/07/2020  . Hyperlipidemia   . Mild concentric left ventricular hypertrophy (LVH) 03/09/2019  . Essential hypertension 03/01/2019   Durwin Reges DPT Durwin Reges 07/31/2020, 3:59 PM  [place] [facility] [address], [place], [address] Phone: [phone]   Fax:  [phone]  Name: Kendra Allison MRN: [id number] Date of Birth: 01/13/1956

## 2020-08-05 ENCOUNTER — Encounter: Payer: Self-pay | Admitting: Physical Therapy

## 2020-08-05 ENCOUNTER — Other Ambulatory Visit: Payer: Self-pay

## 2020-08-05 ENCOUNTER — Ambulatory Visit: Payer: Federal, State, Local not specified - PPO | Admitting: Physical Therapy

## 2020-08-05 DIAGNOSIS — M25572 Pain in left ankle and joints of left foot: Secondary | ICD-10-CM | POA: Diagnosis not present

## 2020-08-05 DIAGNOSIS — M25672 Stiffness of left ankle, not elsewhere classified: Secondary | ICD-10-CM | POA: Diagnosis not present

## 2020-08-05 DIAGNOSIS — M79662 Pain in left lower leg: Secondary | ICD-10-CM | POA: Diagnosis not present

## 2020-08-05 DIAGNOSIS — S82302B Unspecified fracture of lower end of left tibia, initial encounter for open fracture type I or II: Secondary | ICD-10-CM | POA: Diagnosis not present

## 2020-08-05 NOTE — Therapy (Signed)
[place] [facility] [address] [place], [address] Phone: [phone]   Fax:  [phone]  Physical Therapy Treatment  Patient Details  Name: Kendra Allison MRN: [id number] Date of Birth: Jan 17, 1956 No data recorded  Encounter Date: 08/05/2020   PT End of Session - 08/05/20 1017    Visit Number 9    Number of Visits 17    Date for PT Re-Evaluation 09/03/20    Authorization - Visit Number 9    Authorization - Number of Visits 17    PT Start Time 0945    PT Stop Time 1025    PT Time Calculation (min) 40 min    Activity Tolerance Patient tolerated treatment well    Behavior During Therapy [facility] for tasks assessed/performed           Past Medical History:  Diagnosis Date  . Asthma   . GERD (gastroesophageal reflux disease)   . Hypercholesteremia   . Hyperlipidemia   . Hypertension   . Osteoporosis     Past Surgical History:  Procedure Laterality Date  . ABDOMINAL HYSTERECTOMY    . BREAST BIOPSY    . COLONOSCOPY WITH PROPOFOL N/A 08/10/2019   Procedure: COLONOSCOPY WITH PROPOFOL;  Surgeon: Jonathon Bellows, MD;  Location: [facility] ENDOSCOPY;  Service: Gastroenterology;  Laterality: N/A;  . ESOPHAGOGASTRODUODENOSCOPY (EGD) WITH PROPOFOL N/A 08/10/2019   Procedure: ESOPHAGOGASTRODUODENOSCOPY (EGD) WITH PROPOFOL;  Surgeon: Jonathon Bellows, MD;  Location: [facility] ENDOSCOPY;  Service: Gastroenterology;  Laterality: N/A;  . EXTERNAL FIXATION LEG Left 04/07/2020    EXTERNAL FIXATION LEG (Left Leg Lower)  . EXTERNAL FIXATION LEG Left 04/07/2020   Procedure: EXTERNAL FIXATION LEG;  Surgeon: Shona Needles, MD;  Location: [place];  Service: Orthopedics;  Laterality: Left;  . EXTERNAL FIXATION REMOVAL Left 04/10/2020   Procedure: REMOVAL EXTERNAL FIXATION LEG;  Surgeon: Shona Needles, MD;  Location: [place];  Service: Orthopedics;  Laterality: Left;  . I & D EXTREMITY Left 04/07/2020   Procedure: IRRIGATION AND DEBRIDEMENT EXTREMITY;   Surgeon: Shona Needles, MD;  Location: [place];  Service: Orthopedics;  Laterality: Left;  . I & D EXTREMITY Left 06/12/2020   Procedure: IRRIGATION AND DEBRIDEMENT EXTREMITY;  Surgeon: Shona Needles, MD;  Location: [place];  Service: Orthopedics;  Laterality: Left;  . Nissem  2014  . TIBIA IM NAIL INSERTION Left 04/10/2020   Procedure: INTRAMEDULLARY (IM) NAIL TIBIAL;  Surgeon: Shona Needles, MD;  Location: [place];  Service: Orthopedics;  Laterality: Left;    There were no vitals filed for this visit.   Subjective Assessment - 08/05/20 0952    Subjective Reports she has been using her cane since last week. has noticed some heel pain/dorsum of foot following following deep cleaning her floors, because she felt so good. Thinks she overdid it. She has been working on her gastroc tension, and thinks it is getting better.    Pertinent History Pt is 65 y.o s/p L initial I&D with external fixation 04/07/20, then transitioned to ORIF with IM nail of left proximal tibia, and distal tibia/fibula 04/10/20. Subsequent irrigation and drainage of wound 06/11/20. Currently in post op shoe, reporting that surgeon advised her to seek PT opinion on less restrictive shoe option. Is currently unable to wear normal shoes due to swelling. Wearing boot throughout entire day, using RW for ambulation (previously ind without AD), is WBAT per MD notes- though patient reports she cannot tolerate hardly any pressure at this time,  and per patient without any other surgical precautions. Is having stitches removed 07/16/20. Uses a bone stimulator multiple times per day (per Xrays delayed healing of distal tibia and fibula fracture). Pain is currently 2/10 worst 4/10. Pain is aggrevated mainly with pronlonged walking. Pain is eased with ice and rest and motrin prn. Pain in the morning is fine and only gets worst depending on amount of walking done around the house throughout the day. Activities impaired cooking, cleaning, grocery  shopping, bathing, lifting. Pt is currently retired. Pt would like to get back to normal walking without any assisstive device. Denies any N/T, B/B changes, weight changes, night sweats/pain.    Limitations Walking;House hold activities    How long can you sit comfortably? unlimited    How long can you stand comfortably? unlimited    How long can you walk comfortably? 1-2 minutes    Patient Stated Goals Get back to normal walking without an assisstive device    Pain Onset More than a month ago              Therex  Nustep LE 7 Level2for gentle motion and strengthening; no use of UE to promote weightbearing of LLE84mins  Gastroc stretch on slant board 2x 34min; review of gastroc stretch for HEP with good understanding following demo   TRX squat with RLE on balance stone for increased LLE WB 3 x10 with min TC for LLE WB at L greater trochanter  PF on OMEGA leg press 35# 2x 10 with good carry over of eccentric return    Manual Therapy Grade 3-4 mobilizations of talocrural joint using Posterior/Anterier/ Lateral/ Medial glides  STM withtrigger point releaseto gastroc and anterior tibialis with increased PF and DF following                         PT Education - 08/05/20 1017    Education Details therex form/technique    Person(s) Educated Patient    Methods Explanation;Demonstration;Verbal cues    Comprehension Verbalized understanding;Returned demonstration;Verbal cues required            PT Short Term Goals - 07/09/20 1023      PT SHORT TERM GOAL #1   Title Pt will be independent with HEP in order to increase ankle mobility and strength and balance in order to perform household ADLs safely.    Baseline 07/09/20 HEP given    Time 4    Period Weeks    Status New             PT Long Term Goals - 07/09/20 1025      PT LONG TERM GOAL #1   Title Pt will increase gait speed to at least 0.81m/s to demonstrate safe, normal community speed     Baseline 07/09/20 0.31    Time 8    Period Weeks    Status New      PT LONG TERM GOAL #2   Title Pt will demonstrate at least 10 LLE heel raises to demonstrate baseline PF strength in order to particpate in safe community activities and household ADLs    Baseline 07/09/20 R -10 L - unable to test    Time 8    Period Weeks    Status New      PT LONG TERM GOAL #3   Title Pt will increase L SL stance to at least 8secs in order to demonstrate baseline static balance, needed to demonstrate decreased fall risk  Baseline 07/09/20 R 8 sec L - unable to test    Time 8    Period Weeks    Status New      PT LONG TERM GOAL #4   Title Pt will demonstrate ind ambulation without AD with normalized gait mechanics to demonstrate return to PLOF and safety with community ambulation.    Baseline 07/09/20 with RW: step to gait with minimal RLE stance, decreased LLE step length, minimal WB on RLE    Time 8    Period Weeks    Status New      PT LONG TERM GOAL #5   Title Pt will increase L ankle DF A/PROM to at least 20 degrees to demonstrate normalized ankle mobility need for functional squatting and normalized gait mechanics    Baseline 07/09/20 A- 9d P- 11d    Time 8    Period Weeks    Status New                 Plan - 08/05/20 1024    Clinical Impression Statement PT continued therex progression for increased L ankle mobility and lower leg strength with success. Some increased manual techniques ultilized for plantar fascia pain with continued success with manual techniques as well. Patient is able to comply with all cuing for proper technique of therex with good motivation throughout session. PT will continue progression as able.    Personal Factors and Comorbidities Fitness;Past/Current Experience;Comorbidity 1    Examination-Activity Limitations Bathing;Lift;Locomotion Level;Stand;Stairs;Other    Examination-Participation Restrictions Cleaning;Shop;Community Activity     Stability/Clinical Decision Making Evolving/Moderate complexity    Clinical Decision Making Moderate    Rehab Potential Good    PT Frequency 2x / week    PT Duration 8 weeks    PT Treatment/Interventions ADLs/Self Care Home Management;Biofeedback;Cryotherapy;Ultrasound;Moist Heat;Iontophoresis 4mg /ml Dexamethasone;Electrical Stimulation;Gait training;Stair training;Functional mobility training;Neuromuscular re-education;Balance training;Therapeutic exercise;Therapeutic activities;Patient/family education;Orthotic Fit/Training;Manual techniques;Dry needling;Passive range of motion;Scar mobilization;Compression bandaging;Manual lymph drainage;Energy conservation;Taping    PT Next Visit Plan HEP review, initiate POC    PT Home Exercise Plan SL stance, dorsiflexion stetch, seated plantar flexion    Consulted and Agree with Plan of Care Patient           Patient will benefit from skilled therapeutic intervention in order to improve the following deficits and impairments:  Abnormal gait,Decreased activity tolerance,Decreased balance,Decreased endurance,Decreased coordination,Decreased mobility,Decreased range of motion,Difficulty walking,Decreased strength,Decreased scar mobility,Decreased skin integrity,Hypomobility,Increased edema,Impaired flexibility,Increased fascial restricitons,Impaired sensation,Impaired tone,Impaired UE functional use,Pain,Improper body mechanics,Postural dysfunction  Visit Diagnosis: Pain in left ankle and joints of left foot  Stiffness of left ankle, not elsewhere classified     Problem List Patient Active Problem List   Diagnosis Date Noted  . Postoperative wound infection 06/11/2020  . Surgical site infection 06/11/2020  . Asthma   . GERD (gastroesophageal reflux disease)   . Hypertension   . Fracture of tibia and fibula, shaft, left, open type III, initial encounter 04/10/2020  . Displaced comminuted fracture of shaft of left fibula, initial encounter for  closed fracture 04/10/2020  . Fall from height of greater than 3 feet 04/10/2020  . Open displaced pilon fracture of left tibia 04/07/2020  . Hyperlipidemia   . Mild concentric left ventricular hypertrophy (LVH) 03/09/2019  . Essential hypertension 03/01/2019   Durwin Reges DPT Durwin Reges 08/05/2020, 10:29 AM  [place] [facility] [address] [place], [address] Phone: [phone]   Fax:  [phone]  Name: Kendra Allison MRN: [id number]  Date of Birth: [birth year]

## 2020-08-07 ENCOUNTER — Ambulatory Visit: Payer: Federal, State, Local not specified - PPO | Admitting: Physical Therapy

## 2020-08-07 ENCOUNTER — Encounter: Payer: Self-pay | Admitting: Physical Therapy

## 2020-08-07 ENCOUNTER — Other Ambulatory Visit: Payer: Self-pay

## 2020-08-07 DIAGNOSIS — S82302B Unspecified fracture of lower end of left tibia, initial encounter for open fracture type I or II: Secondary | ICD-10-CM | POA: Diagnosis not present

## 2020-08-07 DIAGNOSIS — M25672 Stiffness of left ankle, not elsewhere classified: Secondary | ICD-10-CM

## 2020-08-07 DIAGNOSIS — M25572 Pain in left ankle and joints of left foot: Secondary | ICD-10-CM | POA: Diagnosis not present

## 2020-08-07 DIAGNOSIS — M79662 Pain in left lower leg: Secondary | ICD-10-CM | POA: Diagnosis not present

## 2020-08-07 NOTE — Therapy (Signed)
[place] [facility] [address], [place], [address] Phone: [phone]   Fax:  [phone]  Physical Therapy Treatment/Progress Note Reporting Period 07/09/20 - 08/07/20  Patient Details  Name: Kendra Allison MRN: [id number] Date of Birth: 1955/11/14 No data recorded  Encounter Date: 08/07/2020   PT End of Session - 08/07/20 0952    Visit Number 10    Number of Visits 17    Date for PT Re-Evaluation 09/03/20    Authorization - Visit Number 10    Authorization - Number of Visits 17    PT Start Time 1062    PT Stop Time 1026    PT Time Calculation (min) 39 min    Activity Tolerance Patient tolerated treatment well    Behavior During Therapy [facility] for tasks assessed/performed           Past Medical History:  Diagnosis Date  . Asthma   . GERD (gastroesophageal reflux disease)   . Hypercholesteremia   . Hyperlipidemia   . Hypertension   . Osteoporosis     Past Surgical History:  Procedure Laterality Date  . ABDOMINAL HYSTERECTOMY    . BREAST BIOPSY    . COLONOSCOPY WITH PROPOFOL N/A 08/10/2019   Procedure: COLONOSCOPY WITH PROPOFOL;  Surgeon: Jonathon Bellows, MD;  Location[facility] ENDOSCOPY;  Service: Gastroenterology;  Laterality: N/A;  . ESOPHAGOGASTRODUODENOSCOPY (EGD) WITH PROPOFOL N/A 08/10/2019   Procedure: ESOPHAGOGASTRODUODENOSCOPY (EGD) WITH PROPOFOL;  Surgeon: Jonathon Bellows, MD;  Location: [facility] ENDOSCOPY;  Service: Gastroenterology;  Laterality: N/A;  . EXTERNAL FIXATION LEG Left 04/07/2020    EXTERNAL FIXATION LEG (Left Leg Lower)  . EXTERNAL FIXATION LEG Left 04/07/2020   Procedure: EXTERNAL FIXATION LEG;  Surgeon: Shona Needles, MD;  Location: [place];  Service: Orthopedics;  Laterality: Left;  . EXTERNAL FIXATION REMOVAL Left 04/10/2020   Procedure: REMOVAL EXTERNAL FIXATION LEG;  Surgeon: Shona Needles, MD;  Location: [place];  Service: Orthopedics;  Laterality: Left;  . I & D EXTREMITY Left 04/07/2020    Procedure: IRRIGATION AND DEBRIDEMENT EXTREMITY;  Surgeon: Shona Needles, MD;  Location: [place];  Service: Orthopedics;  Laterality: Left;  . I & D EXTREMITY Left 06/12/2020   Procedure: IRRIGATION AND DEBRIDEMENT EXTREMITY;  Surgeon: Shona Needles, MD;  Location: [place];  Service: Orthopedics;  Laterality: Left;  . Nissem  2014  . TIBIA IM NAIL INSERTION Left 04/10/2020   Procedure: INTRAMEDULLARY (IM) NAIL TIBIAL;  Surgeon: Shona Needles, MD;  Location: [place];  Service: Orthopedics;  Laterality: Left;    There were no vitals filed for this visit.   Subjective Assessment - 08/07/20 0950    Subjective Reports her heel pain is better today, 2/10, that she has been icing it. She has been wearing tennis shoes which she thinks is helping. Continues to walk with just her cane, which she is happy about.    Pertinent History Pt is 65 y.o s/p L initial I&D with external fixation 04/07/20, then transitioned to ORIF with IM nail of left proximal tibia, and distal tibia/fibula 04/10/20. Subsequent irrigation and drainage of wound 06/11/20. Currently in post op shoe, reporting that surgeon advised her to seek PT opinion on less restrictive shoe option. Is currently unable to wear normal shoes due to swelling. Wearing boot throughout entire day, using RW for ambulation (previously ind without AD), is WBAT per MD notes- though patient reports she cannot tolerate hardly any pressure at this time, and per patient  without any other surgical precautions. Is having stitches removed 07/16/20. Uses a bone stimulator multiple times per day (per Xrays delayed healing of distal tibia and fibula fracture). Pain is currently 2/10 worst 4/10. Pain is aggrevated mainly with pronlonged walking. Pain is eased with ice and rest and motrin prn. Pain in the morning is fine and only gets worst depending on amount of walking done around the house throughout the day. Activities impaired cooking, cleaning, grocery shopping, bathing,  lifting. Pt is currently retired. Pt would like to get back to normal walking without any assisstive device. Denies any N/T, B/B changes, weight changes, night sweats/pain.    Limitations Walking;House hold activities    How long can you sit comfortably? unlimited    How long can you stand comfortably? unlimited    How long can you walk comfortably? 1-2 minutes    Patient Stated Goals Get back to normal walking without an assisstive device    Pain Onset More than a month ago              Therex  Nustep LE 7 Level23for gentle motion and strengthening; no use of UE to promote weightbearing of LLE15mins  Step downs 6in step 3x 8 with cuing for forefoot contact and eccentric lower with good carry over   Attempted L SLS heel raise, unable; bilat x10, with RLE toe touch only 2x 10  DF with foot on step x10 3sec holds with cuing needed for set up  TRX squat x10  PT reviewed the following HEP with patient with patient able to demonstrate a set of the following with min cuing for correction needed. PT educated patient on parameters of therex (how/when to inc/decrease intensity, frequency, rep/set range, stretch hold time, and purpose of therex) with verbalized understanding.  Access Code: [email or web address] EHMC rises with counter support - 1 x daily - 2-3 x weekly - 3 sets - 10 reps Squat with Chair Touch - 1 x daily - 2-3 x weekly - 3 sets - 10 reps Heel Toe Raises with Counter Support - 1 x daily - 2-3 x weekly - 3 sets - 10 reps Seated Knee Flexion Extension AROM - 3 x daily - 7 x weekly - 15 reps - 3sec hold Standing Gastroc Stretch - 3 x daily - 7 x weekly - 30-60sec hold    Manual Therapy Grade 3-4 mobilizations of talocrural joint using Posterior/Anterier/ Lateral/ Medial glides  STM withtrigger point releaseto gastroc and anterior tibialis with increased PF and DF following            PT Education - 08/07/20 0952    Education Details therex form/technique     Person(s) Educated Patient    Methods Explanation;Demonstration;Verbal cues    Comprehension Verbalized understanding;Returned demonstration;Verbal cues required            PT Short Term Goals - 07/09/20 1023      PT SHORT TERM GOAL #1   Title Pt will be independent with HEP in order to increase ankle mobility and strength and balance in order to perform household ADLs safely.    Baseline 07/09/20 HEP given    Time 4    Period Weeks    Status New             PT Long Term Goals - 08/07/20 0953      PT LONG TERM GOAL #1   Title Pt will increase gait speed to at least 0.58m/s to demonstrate safe, normal community speed  Baseline 07/09/20 0.31; 08/07/20 0.50m/s    Time 8    Period Weeks    Status On-going      PT LONG TERM GOAL #2   Title Pt will demonstrate at least 10 LLE heel raises to demonstrate baseline PF strength in order to particpate in safe community activities and household ADLs    Baseline 07/09/20 R -10 L - unable to test    Time 8    Period Weeks    Status Deferred      PT LONG TERM GOAL #3   Title Pt will increase L SL stance to at least 8secs in order to demonstrate baseline static balance, needed to demonstrate decreased fall risk    Baseline 07/09/20 R 8 sec L - unable to test; 08/07/20 LLE 1sec    Time 8    Period Weeks    Status On-going      PT LONG TERM GOAL #4   Title Pt will demonstrate ind ambulation without AD with normalized gait mechanics to demonstrate return to PLOF and safety with community ambulation.    Baseline 07/09/20 with RW: step to gait with minimal RLE stance, decreased LLE step length, minimal WB on RLE; 08/07/20 walks with [facility] with decreased L stance R step length    Time 8    Period Weeks    Status On-going      PT LONG TERM GOAL #5   Title Pt will increase L ankle DF A/PROM to at least 20 degrees to demonstrate normalized ankle mobility need for functional squatting and normalized gait mechanics    Baseline 07/09/20 A- 9d P- 11d;  08/07/20 PROM: 3 AROM: 0    Time 8    Period Weeks    Status On-going      Additional Long Term Goals   Additional Long Term Goals Yes      PT LONG TERM GOAL #6   Title Patient will increase FOTO score to 65 to demonstrate predicted increase in functional mobility to complete ADLs    Baseline 07/09/20 49; 08/07/20 65    Time 8    Period Weeks    Status New                 Plan - 08/07/20 1036    Clinical Impression Statement PT continued therex progression for increased L ankle mobility and strength with success. Patien tis able to demonstrate compliance of all multimodal cuing and good motivation throughout session. PT updated goals where patient is making good progress toward all goals. PT updated HEP to reflect progress with demonstrative and verbalized understanding. PT will continue progression as able.    Personal Factors and Comorbidities Fitness;Past/Current Experience;Comorbidity 1    Examination-Activity Limitations Bathing;Lift;Locomotion Level;Stand;Stairs;Other    Examination-Participation Restrictions Cleaning;Shop;Community Activity    Stability/Clinical Decision Making Evolving/Moderate complexity    Clinical Decision Making Moderate    Rehab Potential Good    PT Frequency 2x / week    PT Duration 8 weeks    PT Treatment/Interventions ADLs/Self Care Home Management;Biofeedback;Cryotherapy;Ultrasound;Moist Heat;Iontophoresis 4mg /ml Dexamethasone;Electrical Stimulation;Gait training;Stair training;Functional mobility training;Neuromuscular re-education;Balance training;Therapeutic exercise;Therapeutic activities;Patient/family education;Orthotic Fit/Training;Manual techniques;Dry needling;Passive range of motion;Scar mobilization;Compression bandaging;Manual lymph drainage;Energy conservation;Taping    PT Next Visit Plan HEP review, initiate POC    PT Home Exercise Plan SL stance, dorsiflexion stetch, seated plantar flexion    Consulted and Agree with Plan of Care  Patient           Patient will benefit from skilled therapeutic  intervention in order to improve the following deficits and impairments:  Abnormal gait,Decreased activity tolerance,Decreased balance,Decreased endurance,Decreased coordination,Decreased mobility,Decreased range of motion,Difficulty walking,Decreased strength,Decreased scar mobility,Decreased skin integrity,Hypomobility,Increased edema,Impaired flexibility,Increased fascial restricitons,Impaired sensation,Impaired tone,Impaired UE functional use,Pain,Improper body mechanics,Postural dysfunction  Visit Diagnosis: Pain in left ankle and joints of left foot  Stiffness of left ankle, not elsewhere classified     Problem List Patient Active Problem List   Diagnosis Date Noted  . Postoperative wound infection 06/11/2020  . Surgical site infection 06/11/2020  . Asthma   . GERD (gastroesophageal reflux disease)   . Hypertension   . Fracture of tibia and fibula, shaft, left, open type III, initial encounter 04/10/2020  . Displaced comminuted fracture of shaft of left fibula, initial encounter for closed fracture 04/10/2020  . Fall from height of greater than 3 feet 04/10/2020  . Open displaced pilon fracture of left tibia 04/07/2020  . Hyperlipidemia   . Mild concentric left ventricular hypertrophy (LVH) 03/09/2019  . Essential hypertension 03/01/2019   Durwin Reges DPT Durwin Reges 08/07/2020, 10:39 AM  [place] [facility] [address], [place], [address] Phone: [phone]   Fax:  [phone]  Name: Kendra Allison MRN: [id number] Date of Birth: 30-Jan-1956

## 2020-08-12 ENCOUNTER — Encounter: Payer: Self-pay | Admitting: Physical Therapy

## 2020-08-12 ENCOUNTER — Other Ambulatory Visit: Payer: Self-pay

## 2020-08-12 ENCOUNTER — Ambulatory Visit: Payer: Federal, State, Local not specified - PPO | Admitting: Physical Therapy

## 2020-08-12 DIAGNOSIS — M25672 Stiffness of left ankle, not elsewhere classified: Secondary | ICD-10-CM | POA: Diagnosis not present

## 2020-08-12 DIAGNOSIS — M79662 Pain in left lower leg: Secondary | ICD-10-CM

## 2020-08-12 DIAGNOSIS — M25572 Pain in left ankle and joints of left foot: Secondary | ICD-10-CM | POA: Diagnosis not present

## 2020-08-12 DIAGNOSIS — S82302B Unspecified fracture of lower end of left tibia, initial encounter for open fracture type I or II: Secondary | ICD-10-CM | POA: Diagnosis not present

## 2020-08-12 NOTE — Therapy (Signed)
[facility] [address], [place], [address] Phone: [phone]   Fax:  [phone]  Physical Therapy Treatment  Patient Details  Name: Kendra Allison MRN: [id number] Date of Birth: 1956/03/18 No data recorded  Encounter Date: 08/12/2020   PT End of Session - 08/12/20 1123    Visit Number 11    Number of Visits 17    Date for PT Re-Evaluation 09/03/20    Authorization - Visit Number 11    Authorization - Number of Visits 17    PT Start Time 6720    PT Stop Time 1115    PT Time Calculation (min) 43 min    Activity Tolerance Patient tolerated treatment well;No increased pain    Behavior During Therapy WFL for tasks assessed/performed           Past Medical History:  Diagnosis Date   Asthma    GERD (gastroesophageal reflux disease)    Hypercholesteremia    Hyperlipidemia    Hypertension    Osteoporosis     Past Surgical History:  Procedure Laterality Date   ABDOMINAL HYSTERECTOMY     BREAST BIOPSY     COLONOSCOPY WITH PROPOFOL N/A 08/10/2019   Procedure: COLONOSCOPY WITH PROPOFOL;  Surgeon: Jonathon Bellows, MD;  Location: [facility] ENDOSCOPY;  Service: Gastroenterology;  Laterality: N/A;   ESOPHAGOGASTRODUODENOSCOPY (EGD) WITH PROPOFOL N/A 08/10/2019   Procedure: ESOPHAGOGASTRODUODENOSCOPY (EGD) WITH PROPOFOL;  Surgeon: Jonathon Bellows, MD;  Location: [facility] ENDOSCOPY;  Service: Gastroenterology;  Laterality: N/A;   EXTERNAL FIXATION LEG Left 04/07/2020    EXTERNAL FIXATION LEG (Left Leg Lower)   EXTERNAL FIXATION LEG Left 04/07/2020   Procedure: EXTERNAL FIXATION LEG;  Surgeon: Shona Needles, MD;  Location: [place];  Service: Orthopedics;  Laterality: Left;   EXTERNAL FIXATION REMOVAL Left 04/10/2020   Procedure: REMOVAL EXTERNAL FIXATION LEG;  Surgeon: Shona Needles, MD;  Location: [place];  Service: Orthopedics;  Laterality: Left;   I & D EXTREMITY Left 04/07/2020   Procedure: IRRIGATION AND  DEBRIDEMENT EXTREMITY;  Surgeon: Shona Needles, MD;  Location: [place];  Service: Orthopedics;  Laterality: Left;   I & D EXTREMITY Left 06/12/2020   Procedure: IRRIGATION AND DEBRIDEMENT EXTREMITY;  Surgeon: Shona Needles, MD;  Location: [place];  Service: Orthopedics;  Laterality: Left;   Nissem  2014   TIBIA IM NAIL INSERTION Left 04/10/2020   Procedure: INTRAMEDULLARY (IM) NAIL TIBIAL;  Surgeon: Shona Needles, MD;  Location: [place];  Service: Orthopedics;  Laterality: Left;    There were no vitals filed for this visit.   Subjective Assessment - 08/12/20 1034    Subjective patient reports pain in her ankle is about 2/10 and is feeling better than last week. She has been using ice and heat, repports ice helps with swelling.Patient has new tennis shoes with more support which provides more comfort. patient has continued to use just the cane and not reporting any problems with that.    Pertinent History Pt is 65 y.o s/p L initial I&D with external fixation 04/07/20, then transitioned to ORIF with IM nail of left proximal tibia, and distal tibia/fibula 04/10/20. Subsequent irrigation and drainage of wound 06/11/20. Currently in post op shoe, reporting that surgeon advised her to seek PT opinion on less restrictive shoe option. Is currently unable to wear normal shoes due to swelling. Wearing boot throughout entire day, using RW for ambulation (previously ind without AD), is WBAT per MD notes- though patient  reports she cannot tolerate hardly any pressure at this time, and per patient without any other surgical precautions. Is having stitches removed 07/16/20. Uses a bone stimulator multiple times per day (per Xrays delayed healing of distal tibia and fibula fracture). Pain is currently 2/10 worst 4/10. Pain is aggrevated mainly with pronlonged walking. Pain is eased with ice and rest and motrin prn. Pain in the morning is fine and only gets worst depending on amount of walking done around the house  throughout the day. Activities impaired cooking, cleaning, grocery shopping, bathing, lifting. Pt is currently retired. Pt would like to get back to normal walking without any assisstive device. Denies any N/T, B/B changes, weight changes, night sweats/pain.    Limitations Walking;House hold activities    How long can you sit comfortably? unlimited    How long can you stand comfortably? unlimited    How long can you walk comfortably? 1-2 minutes    Patient Stated Goals Get back to normal walking without an assisstive device    Currently in Pain? Yes    Pain Score 2     Pain Location Ankle    Pain Orientation Left    Pain Descriptors / Indicators Dull;Tightness;Sore    Pain Type Chronic pain;Surgical pain    Pain Onset More than a month ago    Pain Frequency Constant           Manual Therapy Grade 3-4 mobilizations of talocrural joint AP in DF and PA in PF with foot resting on table  STM to gastroch, plantar fascia and tibialis anterior with decreased muscle tension   Therex  Nustep LE 7 Level74for gentle motion and strengthening; no use of UE to promote weightbearing of LLE23mins  Incline gastroch stretch 10 sec hold 10 times, repeated twice  L calf raise with RLE toe touch only 2x 10, attempted without toe touch but unable   TRX squat with calf raise at the top x10, 3 sets   Step downs 6in step 3x 10 with cuing for forefoot contact slow and controlled lower                            PT Education - 08/12/20 1121    Education Details therex technique    Person(s) Educated Patient    Methods Explanation;Demonstration;Verbal cues    Comprehension Verbalized understanding;Returned demonstration;Verbal cues required            PT Short Term Goals - 07/09/20 1023      PT SHORT TERM GOAL #1   Title Pt will be independent with HEP in order to increase ankle mobility and strength and balance in order to perform household ADLs safely.     Baseline 07/09/20 HEP given    Time 4    Period Weeks    Status New             PT Long Term Goals - 08/07/20 0953      PT LONG TERM GOAL #1   Title Pt will increase gait speed to at least 0.10m/s to demonstrate safe, normal community speed    Baseline 07/09/20 0.31; 08/07/20 0.65m/s    Time 8    Period Weeks    Status On-going      PT LONG TERM GOAL #2   Title Pt will demonstrate at least 10 LLE heel raises to demonstrate baseline PF strength in order to particpate in safe community activities and household ADLs  Baseline 07/09/20 R -10 L - unable to test    Time 8    Period Weeks    Status Deferred      PT LONG TERM GOAL #3   Title Pt will increase L SL stance to at least 8secs in order to demonstrate baseline static balance, needed to demonstrate decreased fall risk    Baseline 07/09/20 R 8 sec L - unable to test; 08/07/20 LLE 1sec    Time 8    Period Weeks    Status On-going      PT LONG TERM GOAL #4   Title Pt will demonstrate ind ambulation without AD with normalized gait mechanics to demonstrate return to PLOF and safety with community ambulation.    Baseline 07/09/20 with RW: step to gait with minimal RLE stance, decreased LLE step length, minimal WB on RLE; 08/07/20 walks with [facility] with decreased L stance R step length    Time 8    Period Weeks    Status On-going      PT LONG TERM GOAL #5   Title Pt will increase L ankle DF A/PROM to at least 20 degrees to demonstrate normalized ankle mobility need for functional squatting and normalized gait mechanics    Baseline 07/09/20 A- 9d P- 11d; 08/07/20 PROM: 3 AROM: 0    Time 8    Period Weeks    Status On-going      Additional Long Term Goals   Additional Long Term Goals Yes      PT LONG TERM GOAL #6   Title Patient will increase FOTO score to 65 to demonstrate predicted increase in functional mobility to complete ADLs    Baseline 07/09/20 49; 08/07/20 65    Time 8    Period Weeks    Status New                  Plan - 08/12/20 1124    Clinical Impression Statement PT continuted therex progression to increase L ankle mobility and strength. Patient tolerated mobilizaitons well in order to increase ROM. Patient demonstrated exercises and their progressions with good form and minimal cueing. Patient shows good motivation to continue with HEP and is eager to ensure proper technique is used. PT will continue progression to tolerance.    Personal Factors and Comorbidities Fitness;Past/Current Experience;Comorbidity 1    Examination-Activity Limitations Bathing;Lift;Locomotion Level;Stand;Stairs;Other    Examination-Participation Restrictions Cleaning;Shop;Community Activity    Stability/Clinical Decision Making Evolving/Moderate complexity    Clinical Decision Making Moderate    PT Treatment/Interventions ADLs/Self Care Home Management;Biofeedback;Cryotherapy;Ultrasound;Moist Heat;Iontophoresis 4mg /ml Dexamethasone;Electrical Stimulation;Gait training;Stair training;Functional mobility training;Neuromuscular re-education;Balance training;Therapeutic exercise;Therapeutic activities;Patient/family education;Orthotic Fit/Training;Manual techniques;Dry needling;Passive range of motion;Scar mobilization;Compression bandaging;Manual lymph drainage;Energy conservation;Taping    PT Next Visit Plan assess 1st MTP, review HEP, progress exercises    PT Home Exercise Plan SL stance, dorsiflexion stetch, seated plantar flexion    Consulted and Agree with Plan of Care Patient           Patient will benefit from skilled therapeutic intervention in order to improve the following deficits and impairments:  Abnormal gait,Decreased activity tolerance,Decreased balance,Decreased endurance,Decreased coordination,Decreased mobility,Decreased range of motion,Difficulty walking,Decreased strength,Decreased scar mobility,Decreased skin integrity,Hypomobility,Increased edema,Impaired flexibility,Increased fascial restricitons,Impaired  sensation,Impaired tone,Impaired UE functional use,Pain,Improper body mechanics,Postural dysfunction  Visit Diagnosis: Pain in left lower leg  Pain in left ankle and joints of left foot     Problem List Patient Active Problem List   Diagnosis Date Noted   Postoperative wound infection 06/11/2020  Surgical site infection 06/11/2020   Asthma    GERD (gastroesophageal reflux disease)    Hypertension    Fracture of tibia and fibula, shaft, left, open type III, initial encounter 04/10/2020   Displaced comminuted fracture of shaft of left fibula, initial encounter for closed fracture 04/10/2020   Fall from height of greater than 3 feet 04/10/2020   Open displaced pilon fracture of left tibia 04/07/2020   Hyperlipidemia    Mild concentric left ventricular hypertrophy (LVH) 03/09/2019   Essential hypertension 03/01/2019    Durwin Reges DPT [address], SPT  Durwin Reges 08/12/2020, 4:50 PM  [facility] [address] [place], [address] Phone: [phone]   Fax:  [phone]  Name: Kendra Allison MRN: [id number] Date of Birth: 05/15/1956

## 2020-08-14 ENCOUNTER — Other Ambulatory Visit: Payer: Self-pay

## 2020-08-14 ENCOUNTER — Encounter: Payer: Self-pay | Admitting: Physical Therapy

## 2020-08-14 ENCOUNTER — Ambulatory Visit: Payer: Federal, State, Local not specified - PPO | Admitting: Physical Therapy

## 2020-08-14 DIAGNOSIS — M25572 Pain in left ankle and joints of left foot: Secondary | ICD-10-CM | POA: Diagnosis not present

## 2020-08-14 DIAGNOSIS — M79662 Pain in left lower leg: Secondary | ICD-10-CM | POA: Diagnosis not present

## 2020-08-14 DIAGNOSIS — S82302B Unspecified fracture of lower end of left tibia, initial encounter for open fracture type I or II: Secondary | ICD-10-CM | POA: Diagnosis not present

## 2020-08-14 DIAGNOSIS — M25672 Stiffness of left ankle, not elsewhere classified: Secondary | ICD-10-CM | POA: Diagnosis not present

## 2020-08-14 NOTE — Therapy (Signed)
[facility] [address], [place], [address] Phone: [phone]   Fax:  [phone]  Physical Therapy Treatment  Patient Details  Name: Kendra Allison MRN: [id number] Date of Birth: [birth year] No data recorded  Encounter Date: 08/14/2020   PT End of Session - 08/14/20 1122    Visit Number 12    Number of Visits 17    Date for PT Re-Evaluation 09/03/20    Authorization - Visit Number 12    Authorization - Number of Visits 17    PT Start Time 0017    PT Stop Time 1113    PT Time Calculation (min) 43 min    Activity Tolerance Patient tolerated treatment well;No increased pain    Behavior During Therapy WFL for tasks assessed/performed           Past Medical History:  Diagnosis Date  . Asthma   . GERD (gastroesophageal reflux disease)   . Hypercholesteremia   . Hyperlipidemia   . Hypertension   . Osteoporosis     Past Surgical History:  Procedure Laterality Date  . ABDOMINAL HYSTERECTOMY    . BREAST BIOPSY    . COLONOSCOPY WITH PROPOFOL N/A 08/10/2019   Procedure: COLONOSCOPY WITH PROPOFOL;  Surgeon: Jonathon Bellows, MD;  Location: [facility] ENDOSCOPY;  Service: Gastroenterology;  Laterality: N/A;  . ESOPHAGOGASTRODUODENOSCOPY (EGD) WITH PROPOFOL N/A 08/10/2019   Procedure: ESOPHAGOGASTRODUODENOSCOPY (EGD) WITH PROPOFOL;  Surgeon: Jonathon Bellows, MD;  Location: [facility] ENDOSCOPY;  Service: Gastroenterology;  Laterality: N/A;  . EXTERNAL FIXATION LEG Left 04/07/2020    EXTERNAL FIXATION LEG (Left Leg Lower)  . EXTERNAL FIXATION LEG Left 04/07/2020   Procedure: EXTERNAL FIXATION LEG;  Surgeon: Shona Needles, MD;  Location: [place];  Service: Orthopedics;  Laterality: Left;  . EXTERNAL FIXATION REMOVAL Left 04/10/2020   Procedure: REMOVAL EXTERNAL FIXATION LEG;  Surgeon: Shona Needles, MD;  Location: [place];  Service: Orthopedics;  Laterality: Left;  . I & D EXTREMITY Left 04/07/2020   Procedure: IRRIGATION AND  DEBRIDEMENT EXTREMITY;  Surgeon: Shona Needles, MD;  Location: [place];  Service: Orthopedics;  Laterality: Left;  . I & D EXTREMITY Left 06/12/2020   Procedure: IRRIGATION AND DEBRIDEMENT EXTREMITY;  Surgeon: Shona Needles, MD;  Location: [place];  Service: Orthopedics;  Laterality: Left;  . Nissem  2014  . TIBIA IM NAIL INSERTION Left 04/10/2020   Procedure: INTRAMEDULLARY (IM) NAIL TIBIAL;  Surgeon: Shona Needles, MD;  Location: [place];  Service: Orthopedics;  Laterality: Left;    There were no vitals filed for this visit.   Manual Therapy Grade 3-4 mobilizations of talocrural joint AP in DF and PA in PF with foot resting on table  STM to gastroch with decreased muscle tension  Muscle energy for DF with increased motion    Subjective Assessment - 08/14/20 1026    Subjective patient reports pain 2/10 with pain in the ankle. Patient continue to use ice and heat for swelling and pain. Able to walk 10 minutes in grocery store but fatigued after 10 minutes and had to use the automatic wheelchair buggy.    Pertinent History Pt is 65 y.o s/p L initial I&D with external fixation 04/07/20, then transitioned to ORIF with IM nail of left proximal tibia, and distal tibia/fibula 04/10/20. Subsequent irrigation and drainage of wound 06/11/20. Currently in post op shoe, reporting that surgeon advised her to seek PT opinion on less restrictive shoe option. Is currently unable to  wear normal shoes due to swelling. Wearing boot throughout entire day, using RW for ambulation (previously ind without AD), is WBAT per MD notes- though patient reports she cannot tolerate hardly any pressure at this time, and per patient without any other surgical precautions. Is having stitches removed 07/16/20. Uses a bone stimulator multiple times per day (per Xrays delayed healing of distal tibia and fibula fracture). Pain is currently 2/10 worst 4/10. Pain is aggrevated mainly with pronlonged walking. Pain is eased with ice and rest  and motrin prn. Pain in the morning is fine and only gets worst depending on amount of walking done around the house throughout the day. Activities impaired cooking, cleaning, grocery shopping, bathing, lifting. Pt is currently retired. Pt would like to get back to normal walking without any assisstive device. Denies any N/T, B/B changes, weight changes, night sweats/pain.    Limitations Walking;House hold activities    How long can you sit comfortably? unlimited    How long can you stand comfortably? unlimited    How long can you walk comfortably? 1-2 minutes    Patient Stated Goals Get back to normal walking without an assisstive device    Currently in Pain? Yes    Pain Score 2     Pain Location Ankle    Pain Orientation Left          Therex  Nustep LE setting= ,  Level2for gentle motion and strengthening; no use of UE to promote weightbearing of LLE17mins  L calf raise with RLE toe touch only 2x10, attempted without toe touch but still unable, cueing to decrease weight on R leg each time  Bilateral calf raise with heels hanging off edge of step to increase ROM and work on muscle strength, hold in DF stretch ~5seconds and then raise, 3 sets of 10  Total gym calf raises - single leg 2 sets of 12 with RLE crossed over LLE   TRX squat with calf raise at the top, RLE = toe touch only x10, 2 sets   untimed gait training - 60 feet, 2 times-  to work on more continue progression of swing leg and increase gait speed                              PT Education - 08/14/20 1645    Education Details therex form/technique    Person(s) Educated Patient    Methods Explanation;Verbal cues;Demonstration    Comprehension Verbalized understanding;Verbal cues required;Returned demonstration            PT Short Term Goals - 07/09/20 1023      PT SHORT TERM GOAL #1   Title Pt will be independent with HEP in order to increase ankle mobility and strength and balance  in order to perform household ADLs safely.    Baseline 07/09/20 HEP given    Time 4    Period Weeks    Status New             PT Long Term Goals - 08/07/20 0953      PT LONG TERM GOAL #1   Title Pt will increase gait speed to at least 0.61m/s to demonstrate safe, normal community speed    Baseline 07/09/20 0.31; 08/07/20 0.31m/s    Time 8    Period Weeks    Status On-going      PT LONG TERM GOAL #2   Title Pt will demonstrate at least 10  LLE heel raises to demonstrate baseline PF strength in order to particpate in safe community activities and household ADLs    Baseline 07/09/20 R -10 L - unable to test    Time 8    Period Weeks    Status Deferred      PT LONG TERM GOAL #3   Title Pt will increase L SL stance to at least 8secs in order to demonstrate baseline static balance, needed to demonstrate decreased fall risk    Baseline 07/09/20 R 8 sec L - unable to test; 08/07/20 LLE 1sec    Time 8    Period Weeks    Status On-going      PT LONG TERM GOAL #4   Title Pt will demonstrate ind ambulation without AD with normalized gait mechanics to demonstrate return to PLOF and safety with community ambulation.    Baseline 07/09/20 with RW: step to gait with minimal RLE stance, decreased LLE step length, minimal WB on RLE; 08/07/20 walks with [facility] with decreased L stance R step length    Time 8    Period Weeks    Status On-going      PT LONG TERM GOAL #5   Title Pt will increase L ankle DF A/PROM to at least 20 degrees to demonstrate normalized ankle mobility need for functional squatting and normalized gait mechanics    Baseline 07/09/20 A- 9d P- 11d; 08/07/20 PROM: 3 AROM: 0    Time 8    Period Weeks    Status On-going      Additional Long Term Goals   Additional Long Term Goals Yes      PT LONG TERM GOAL #6   Title Patient will increase FOTO score to 65 to demonstrate predicted increase in functional mobility to complete ADLs    Baseline 07/09/20 49; 08/07/20 65    Time 8    Period  Weeks    Status New                 Plan - 08/14/20 1050    Clinical Impression Statement PT continued therex progression to increase L ankle mobility and strength. Patient tolerated mobilizations and muscle energy well to increase ROM. Patient was able to demonstrate proper technique after initial demonstration with verbal cues. Patient shows good motivation to continue with HEP and use proper techniqe. PT will continue progression to toleance to increase strength and ROM.    Personal Factors and Comorbidities Fitness;Past/Current Experience;Comorbidity 1    Examination-Activity Limitations Bathing;Lift;Locomotion Level;Stand;Stairs;Other    Examination-Participation Restrictions Cleaning;Shop;Community Activity    Stability/Clinical Decision Making Evolving/Moderate complexity    Clinical Decision Making Moderate    Rehab Potential Good    PT Frequency 2x / week    PT Duration 8 weeks    PT Treatment/Interventions ADLs/Self Care Home Management;Biofeedback;Cryotherapy;Ultrasound;Moist Heat;Iontophoresis 4mg /ml Dexamethasone;Electrical Stimulation;Gait training;Stair training;Functional mobility training;Neuromuscular re-education;Balance training;Therapeutic exercise;Therapeutic activities;Patient/family education;Orthotic Fit/Training;Manual techniques;Dry needling;Passive range of motion;Scar mobilization;Compression bandaging;Manual lymph drainage;Energy conservation;Taping    PT Next Visit Plan assess 1st MTP, review HEP, progress exercises    PT Home Exercise Plan SL stance, dorsiflexion stetch, seated plantar flexion    Consulted and Agree with Plan of Care Patient           Patient will benefit from skilled therapeutic intervention in order to improve the following deficits and impairments:  Abnormal gait,Decreased activity tolerance,Decreased balance,Decreased endurance,Decreased coordination,Decreased mobility,Decreased range of motion,Difficulty walking,Decreased  strength,Decreased scar mobility,Decreased skin integrity,Hypomobility,Increased edema,Impaired flexibility,Increased fascial restricitons,Impaired sensation,Impaired tone,Impaired UE functional  use,Pain,Improper body mechanics,Postural dysfunction  Visit Diagnosis: Acute left ankle pain  Type I or II open fracture of distal end of left tibia, unspecified fracture morphology, initial encounter     Problem List Patient Active Problem List   Diagnosis Date Noted  . Postoperative wound infection 06/11/2020  . Surgical site infection 06/11/2020  . Asthma   . GERD (gastroesophageal reflux disease)   . Hypertension   . Fracture of tibia and fibula, shaft, left, open type III, initial encounter 04/10/2020  . Displaced comminuted fracture of shaft of left fibula, initial encounter for closed fracture 04/10/2020  . Fall from height of greater than 3 feet 04/10/2020  . Open displaced pilon fracture of left tibia 04/07/2020  . Hyperlipidemia   . Mild concentric left ventricular hypertrophy (LVH) 03/09/2019  . Essential hypertension 03/01/2019    Durwin Reges DPT [address], SPT  Durwin Reges 08/14/2020, 4:46 PM  [facility] [address], [place], [address] Phone: [phone]   Fax:  [phone]  Name: Kendra Allison MRN: [id number] Date of Birth: 03-09-1956

## 2020-08-19 ENCOUNTER — Other Ambulatory Visit: Payer: Self-pay

## 2020-08-19 ENCOUNTER — Ambulatory Visit: Payer: Federal, State, Local not specified - PPO

## 2020-08-19 ENCOUNTER — Encounter: Payer: Self-pay | Admitting: Physical Therapy

## 2020-08-19 DIAGNOSIS — M25672 Stiffness of left ankle, not elsewhere classified: Secondary | ICD-10-CM | POA: Diagnosis not present

## 2020-08-19 DIAGNOSIS — M79662 Pain in left lower leg: Secondary | ICD-10-CM | POA: Diagnosis not present

## 2020-08-19 DIAGNOSIS — M25572 Pain in left ankle and joints of left foot: Secondary | ICD-10-CM | POA: Diagnosis not present

## 2020-08-19 DIAGNOSIS — S82302B Unspecified fracture of lower end of left tibia, initial encounter for open fracture type I or II: Secondary | ICD-10-CM | POA: Diagnosis not present

## 2020-08-19 NOTE — Therapy (Signed)
[facility] [address], [place], [address] Phone: [phone]   Fax:  [phone]  Physical Therapy Treatment  Patient Details  Name: Kendra Allison MRN: [id number] Date of Birth: May 15, 1956 No data recorded  Encounter Date: 08/19/2020   PT End of Session - 08/19/20 1207    Visit Number 13    Number of Visits 17    Date for PT Re-Evaluation 09/03/20    Authorization - Visit Number 13    Authorization - Number of Visits 17    PT Start Time 5009    PT Stop Time 1113    PT Time Calculation (min) 43 min    Equipment Utilized During Treatment Gait belt    Activity Tolerance Patient tolerated treatment well;No increased pain    Behavior During Therapy WFL for tasks assessed/performed           Past Medical History:  Diagnosis Date  . Asthma   . GERD (gastroesophageal reflux disease)   . Hypercholesteremia   . Hyperlipidemia   . Hypertension   . Osteoporosis     Past Surgical History:  Procedure Laterality Date  . ABDOMINAL HYSTERECTOMY    . BREAST BIOPSY    . COLONOSCOPY WITH PROPOFOL N/A 08/10/2019   Procedure: COLONOSCOPY WITH PROPOFOL;  Surgeon: Jonathon Bellows, MD;  Location: [facility] ENDOSCOPY;  Service: Gastroenterology;  Laterality: N/A;  . ESOPHAGOGASTRODUODENOSCOPY (EGD) WITH PROPOFOL N/A 08/10/2019   Procedure: ESOPHAGOGASTRODUODENOSCOPY (EGD) WITH PROPOFOL;  Surgeon: Jonathon Bellows, MD;  Location: [facility] ENDOSCOPY;  Service: Gastroenterology;  Laterality: N/A;  . EXTERNAL FIXATION LEG Left 04/07/2020    EXTERNAL FIXATION LEG (Left Leg Lower)  . EXTERNAL FIXATION LEG Left 04/07/2020   Procedure: EXTERNAL FIXATION LEG;  Surgeon: Shona Needles, MD;  Location: [place];  Service: Orthopedics;  Laterality: Left;  . EXTERNAL FIXATION REMOVAL Left 04/10/2020   Procedure: REMOVAL EXTERNAL FIXATION LEG;  Surgeon: Shona Needles, MD;  Location: [place];  Service: Orthopedics;  Laterality: Left;  . I & D  EXTREMITY Left 04/07/2020   Procedure: IRRIGATION AND DEBRIDEMENT EXTREMITY;  Surgeon: Shona Needles, MD;  Location: [place];  Service: Orthopedics;  Laterality: Left;  . I & D EXTREMITY Left 06/12/2020   Procedure: IRRIGATION AND DEBRIDEMENT EXTREMITY;  Surgeon: Shona Needles, MD;  Location: [place];  Service: Orthopedics;  Laterality: Left;  . [place]  2014  . TIBIA IM NAIL INSERTION Left 04/10/2020   Procedure: INTRAMEDULLARY (IM) NAIL TIBIAL;  Surgeon: Shona Needles, MD;  Location: [place];  Service: Orthopedics;  Laterality: Left;    There were no vitals filed for this visit.   Subjective Assessment - 08/19/20 1029    Subjective Patient reports 2/10 pain in the ankle. Patient has some swelling but ice and compression sock reduces swelling. Patient reports exercises going well, still working on LLE calf raise. Patient reports walking around the grocery store for about 20 minutes which is improvement from last week.    Pertinent History Pt is 65 y.o s/p L initial I&D with external fixation 04/07/20, then transitioned to ORIF with IM nail of left proximal tibia, and distal tibia/fibula 04/10/20. Subsequent irrigation and drainage of wound 06/11/20. Currently in post op shoe, reporting that surgeon advised her to seek PT opinion on less restrictive shoe option. Is currently unable to wear normal shoes due to swelling. Wearing boot throughout entire day, using RW for ambulation (previously ind without AD), is WBAT per MD notes- though  patient reports she cannot tolerate hardly any pressure at this time, and per patient without any other surgical precautions. Is having stitches removed 07/16/20. Uses a bone stimulator multiple times per day (per Xrays delayed healing of distal tibia and fibula fracture). Pain is currently 2/10 worst 4/10. Pain is aggrevated mainly with pronlonged walking. Pain is eased with ice and rest and motrin prn. Pain in the morning is fine and only gets worst depending on amount of  walking done around the house throughout the day. Activities impaired cooking, cleaning, grocery shopping, bathing, lifting. Pt is currently retired. Pt would like to get back to normal walking without any assisstive device. Denies any N/T, B/B changes, weight changes, night sweats/pain.    Limitations Walking;House hold activities    How long can you sit comfortably? unlimited    How long can you stand comfortably? unlimited    How long can you walk comfortably? 1-2 minutes    Patient Stated Goals Get back to normal walking without an assisstive device    Currently in Pain? Yes    Pain Score 2     Pain Location Ankle    Pain Type Chronic pain;Surgical pain    Pain Onset More than a month ago    Pain Frequency Constant           Therex:   Nustep LE setting=7,  Level28for gentle motion and strengthening; no use of UE to promote weightbearing of LLE57mins TRX squatwith calf raise at the top, RLE = toe touch onlyx10, 3 sets Single leg calf raise with heels hanging off edge of step to increase ROM and work on muscle strength, hold in DF stretch ~5seconds and then raise, 3 sets of 8   Gait training:  200 feet with 1# AW on LLE with cane,  cueing for heel strike and increased push off on LLE  100 feet without AW on LLE, with cane,  cueing for heel strike and increased push off on LLE  200 feet without cane cueing to increase stance time on LLE- both directions: 100 feet CW, 100 feet CCW   Manual Therapy:  DF muscle energy in prone, patient pushed into PF submaximally then relaxed to achieve new motion, repeated three times   grade 4 AP glides in DF with patient in supine with foot slightly off table, 1 minute of glide then move to new range and repeated   Grade 4 PA glides in PF with patient in supine with knee bent and feet flat on table, glide 30 seconds then move foot out further and repeated three times.   STM to release trigger points in ant. Tib.        PT Education -  08/19/20 1206    Education Details therex form/ technique, gait performance    Person(s) Educated Patient    Methods Verbal cues;Demonstration;Explanation    Comprehension Verbalized understanding;Returned demonstration;Verbal cues required            PT Short Term Goals - 07/09/20 1023      PT SHORT TERM GOAL #1   Title Pt will be independent with HEP in order to increase ankle mobility and strength and balance in order to perform household ADLs safely.    Baseline 07/09/20 HEP given    Time 4    Period Weeks    Status New             PT Long Term Goals - 08/07/20 0953      PT LONG  TERM GOAL #1   Title Pt will increase gait speed to at least 0.55m/s to demonstrate safe, normal community speed    Baseline 07/09/20 0.31; 08/07/20 0.80m/s    Time 8    Period Weeks    Status On-going      PT LONG TERM GOAL #2   Title Pt will demonstrate at least 10 LLE heel raises to demonstrate baseline PF strength in order to particpate in safe community activities and household ADLs    Baseline 07/09/20 R -10 L - unable to test    Time 8    Period Weeks    Status Deferred      PT LONG TERM GOAL #3   Title Pt will increase L SL stance to at least 8secs in order to demonstrate baseline static balance, needed to demonstrate decreased fall risk    Baseline 07/09/20 R 8 sec L - unable to test; 08/07/20 LLE 1sec    Time 8    Period Weeks    Status On-going      PT LONG TERM GOAL #4   Title Pt will demonstrate ind ambulation without AD with normalized gait mechanics to demonstrate return to PLOF and safety with community ambulation.    Baseline 07/09/20 with RW: step to gait with minimal RLE stance, decreased LLE step length, minimal WB on RLE; 08/07/20 walks with [facility] with decreased L stance R step length    Time 8    Period Weeks    Status On-going      PT LONG TERM GOAL #5   Title Pt will increase L ankle DF A/PROM to at least 20 degrees to demonstrate normalized ankle mobility need for  functional squatting and normalized gait mechanics    Baseline 07/09/20 A- 9d P- 11d; 08/07/20 PROM: 3 AROM: 0    Time 8    Period Weeks    Status On-going      Additional Long Term Goals   Additional Long Term Goals Yes      PT LONG TERM GOAL #6   Title Patient will increase FOTO score to 65 to demonstrate predicted increase in functional mobility to complete ADLs    Baseline 07/09/20 49; 08/07/20 65    Time 8    Period Weeks    Status New                 Plan - 08/19/20 1207    Clinical Impression Statement PT continue therex progression to increase L ankle strength. Patient was able to demonstrate progressions with proper technique after initial demonstration and mild cueing. Patient tolerate mobilizations well and demosntrated increased ROM. Patient responded well to gait training to normalize gait cycle. Patient motivated to use proper technique. PT will continue progressions to tolerance.    Personal Factors and Comorbidities Fitness;Past/Current Experience;Comorbidity 1    Examination-Activity Limitations Bathing;Lift;Locomotion Level;Stand;Stairs;Other    Stability/Clinical Decision Making Evolving/Moderate complexity    Clinical Decision Making Moderate    Rehab Potential Good    PT Frequency 2x / week    PT Duration 8 weeks    PT Treatment/Interventions ADLs/Self Care Home Management;Biofeedback;Cryotherapy;Ultrasound;Moist Heat;Iontophoresis 4mg /ml Dexamethasone;Electrical Stimulation;Gait training;Stair training;Functional mobility training;Neuromuscular re-education;Balance training;Therapeutic exercise;Therapeutic activities;Patient/family education;Orthotic Fit/Training;Manual techniques;Dry needling;Passive range of motion;Scar mobilization;Compression bandaging;Manual lymph drainage;Energy conservation;Taping    PT Next Visit Plan review HEP, gait training, progress exercises    PT Home Exercise Plan SL stance, dorsiflexion stetch, seated plantar flexion    Consulted  and Agree with Plan of Care Patient  Patient will benefit from skilled therapeutic intervention in order to improve the following deficits and impairments:  Abnormal gait,Decreased activity tolerance,Decreased balance,Decreased endurance,Decreased coordination,Decreased mobility,Decreased range of motion,Difficulty walking,Decreased strength,Decreased scar mobility,Decreased skin integrity,Hypomobility,Increased edema,Impaired flexibility,Increased fascial restricitons,Impaired sensation,Impaired tone,Impaired UE functional use,Pain,Improper body mechanics,Postural dysfunction  Visit Diagnosis: Pain in left ankle and joints of left foot     Problem List Patient Active Problem List   Diagnosis Date Noted  . Postoperative wound infection 06/11/2020  . Surgical site infection 06/11/2020  . Asthma   . GERD (gastroesophageal reflux disease)   . Hypertension   . Fracture of tibia and fibula, shaft, left, open type III, initial encounter 04/10/2020  . Displaced comminuted fracture of shaft of left fibula, initial encounter for closed fracture 04/10/2020  . Fall from height of greater than 3 feet 04/10/2020  . Open displaced pilon fracture of left tibia 04/07/2020  . Hyperlipidemia   . Mild concentric left ventricular hypertrophy (LVH) 03/09/2019  . Essential hypertension 03/01/2019   Andrey Campanile, SPT  Kaitlyn Wall 08/19/2020, 12:12 PM  [facility] [facility] [address] [place], [address] Phone: [phone]   Fax:  [phone]  Name: Kendra Allison MRN: [id number] Date of Birth: 1956/03/12

## 2020-08-21 ENCOUNTER — Ambulatory Visit (INDEPENDENT_AMBULATORY_CARE_PROVIDER_SITE_OTHER): Payer: Federal, State, Local not specified - PPO | Admitting: Family Medicine

## 2020-08-21 ENCOUNTER — Encounter: Payer: Self-pay | Admitting: Physical Therapy

## 2020-08-21 ENCOUNTER — Other Ambulatory Visit: Payer: Self-pay

## 2020-08-21 ENCOUNTER — Ambulatory Visit: Payer: Federal, State, Local not specified - PPO | Admitting: Physical Therapy

## 2020-08-21 ENCOUNTER — Encounter: Payer: Self-pay | Admitting: Family Medicine

## 2020-08-21 VITALS — BP 144/74 | HR 71 | Ht 65.25 in | Wt 168.0 lb

## 2020-08-21 DIAGNOSIS — Z0000 Encounter for general adult medical examination without abnormal findings: Secondary | ICD-10-CM | POA: Diagnosis not present

## 2020-08-21 DIAGNOSIS — M25572 Pain in left ankle and joints of left foot: Secondary | ICD-10-CM | POA: Diagnosis not present

## 2020-08-21 DIAGNOSIS — J9801 Acute bronchospasm: Secondary | ICD-10-CM

## 2020-08-21 DIAGNOSIS — Z1231 Encounter for screening mammogram for malignant neoplasm of breast: Secondary | ICD-10-CM | POA: Diagnosis not present

## 2020-08-21 DIAGNOSIS — K219 Gastro-esophageal reflux disease without esophagitis: Secondary | ICD-10-CM | POA: Diagnosis not present

## 2020-08-21 DIAGNOSIS — E538 Deficiency of other specified B group vitamins: Secondary | ICD-10-CM

## 2020-08-21 DIAGNOSIS — D509 Iron deficiency anemia, unspecified: Secondary | ICD-10-CM | POA: Diagnosis not present

## 2020-08-21 DIAGNOSIS — M25672 Stiffness of left ankle, not elsewhere classified: Secondary | ICD-10-CM

## 2020-08-21 DIAGNOSIS — E7800 Pure hypercholesterolemia, unspecified: Secondary | ICD-10-CM

## 2020-08-21 DIAGNOSIS — R7309 Other abnormal glucose: Secondary | ICD-10-CM | POA: Diagnosis not present

## 2020-08-21 DIAGNOSIS — T8149XA Infection following a procedure, other surgical site, initial encounter: Secondary | ICD-10-CM

## 2020-08-21 DIAGNOSIS — I10 Essential (primary) hypertension: Secondary | ICD-10-CM | POA: Diagnosis not present

## 2020-08-21 DIAGNOSIS — E782 Mixed hyperlipidemia: Secondary | ICD-10-CM

## 2020-08-21 DIAGNOSIS — S82302B Unspecified fracture of lower end of left tibia, initial encounter for open fracture type I or II: Secondary | ICD-10-CM | POA: Diagnosis not present

## 2020-08-21 DIAGNOSIS — E663 Overweight: Secondary | ICD-10-CM

## 2020-08-21 DIAGNOSIS — E559 Vitamin D deficiency, unspecified: Secondary | ICD-10-CM

## 2020-08-21 DIAGNOSIS — M79662 Pain in left lower leg: Secondary | ICD-10-CM | POA: Diagnosis not present

## 2020-08-21 MED ORDER — ROSUVASTATIN CALCIUM 10 MG PO TABS: 10.0000 mg | ORAL_TABLET | Freq: Every day | ORAL | 3 refills | Status: DC

## 2020-08-21 MED ORDER — ALBUTEROL SULFATE HFA 108 (90 BASE) MCG/ACT IN AERS: 2.0000 | INHALATION_SPRAY | RESPIRATORY_TRACT | 3 refills | Status: DC | PRN

## 2020-08-21 MED ORDER — AMLODIPINE BESYLATE 10 MG PO TABS: 10.0000 mg | ORAL_TABLET | Freq: Every day | ORAL | 3 refills | Status: DC

## 2020-08-21 MED ORDER — LOSARTAN POTASSIUM 50 MG PO TABS: 50.0000 mg | ORAL_TABLET | Freq: Every day | ORAL | 3 refills | Status: DC

## 2020-08-21 NOTE — Progress Notes (Signed)
Subjective:    Patient ID: Kendra Allison, female    DOB: 11-29-1955, 65 y.o.   MRN: [id number]  Kendra Allison is a 65 y.o. female presenting on 08/21/2020 for Annual Exam, Hypertension, and Knee Pain   HPI   Here for Annual Physical and due for fasting labs.  GERD / History of positive H Pylori  Chronic sinusitis / Eustachian tube dysfunction Reports chronic sinus pressure, drainage, congestion, ear fullness and pressure pain, left is worse Throat and Nose Feel Dry all the time Felt like ears clogged, causing her to talk louder Requesting referral to ENT Has azelastine/Fluticasone, but was not using regularly, request re order, has singulair.  Hypertension Improved control on Amlodipine 61m daily, Losartan 564mdaily.  Hyperlipidemia On Rosuvastatin, 1083mue for lipids today  History of Asthma Refill Albuterol PRN  Anemia She is taking iron pills / beet juice. Due for check on lab today   Additional updates  11/01/0272Complicated fall, with tibial fracture, ORIF multiple surgeries, she had complication with infection. Orthopedic with Dr HadDoreatha MartinShe had wound infection. Treated with I&D, no sign of osteomyelitis, debridement, wound culture grew pseudomonas, sensitive to FQ. Given IV Cefepime, then PO Levaquin. She saw [facility] -   She continues to go to Physical Therapy. walking improved, now 3 weeks  Says she may need in network Ortho - can get rec from insurance   Health Maintenance:  UTD COVID19 vaccine series. Including booster.  Last colonoscopy done [facility] Dr AnnVicente Males18/21 - polyp, repeat in 5 years.  Mammogram completed 04/2019, next due 04/2021 every 2 years approx.  Seborrheic Dermatitis improved w/ triamcinolone.   [facility] 10/02[facility]2019 03/23/2019 03/01/2019  Decreased Interest 0 0 0  Down, Depressed, Hopeless 0 0 0  PHQ - 2 Score 0 0 0  Altered sleeping - 0 0  Tired, decreased energy - 0 3  Change in appetite - 0 0  Feeling  bad or failure about yourself  - 0 0  Trouble concentrating - 0 0  Moving slowly or fidgety/restless - 0 0  Suicidal thoughts - 0 0  PHQ-9 Score - 0 3  Difficult doing work/chores - Not difficult at all Not difficult at all   No flowsheet data found.    Past Medical History:  Diagnosis Date  . Asthma   . GERD (gastroesophageal reflux disease)   . Hypercholesteremia   . Hyperlipidemia   . Hypertension   . Osteoporosis    Past Surgical History:  Procedure Laterality Date  . ABDOMINAL HYSTERECTOMY    . BREAST BIOPSY    . COLONOSCOPY WITH PROPOFOL N/A 08/10/2019   Procedure: COLONOSCOPY WITH PROPOFOL;  Surgeon: AnnJonathon BellowsD;  [facility];  Service: Gastroenterology;  Laterality: N/A;  . ESOPHAGOGASTRODUODENOSCOPY (EGD) WITH PROPOFOL N/A 08/10/2019   Procedure: ESOPHAGOGASTRODUODENOSCOPY (EGD) WITH PROPOFOL;  Surgeon: AnnJonathon BellowsD;  [facility];  Service: Gastroenterology;  Laterality: N/A;  . EXTERNAL FIXATION LEG Left 04/07/2020    EXTERNAL FIXATION LEG (Left Leg Lower)  . EXTERNAL FIXATION LEG Left 04/07/2020   Procedure: EXTERNAL FIXATION LEG;  Surgeon: HadShona NeedlesD;  [facility];  Laterality: Left;  . EXTERNAL FIXATION REMOVAL Left 04/10/2020   Procedure: REMOVAL EXTERNAL FIXATION LEG;  Surgeon: HadShona NeedlesD;  [facility];  Laterality: Left;  . I & D EXTREMITY Left 04/07/2020   Procedure: IRRIGATION AND DEBRIDEMENT EXTREMITY;  Surgeon: HadShona NeedlesD;  Location: [place];  Service: Orthopedics;  Laterality: Left;  . I & D EXTREMITY Left 06/12/2020   Procedure: IRRIGATION AND DEBRIDEMENT EXTREMITY;  Surgeon: Shona Needles, MD;  Location: [place];  Service: Orthopedics;  Laterality: Left;  . [place]  2014  . TIBIA IM NAIL INSERTION Left 04/10/2020   Procedure: INTRAMEDULLARY (IM) NAIL TIBIAL;  Surgeon: Shona Needles, MD;  Location: [place];  Service: Orthopedics;  Laterality: Left;    Social History   Socioeconomic History  . Marital status: Single    Spouse name: Not on file  . Number of children: Not on file  . Years of education: Not on file  . Highest education level: Not on file  Occupational History  . Not on file  Tobacco Use  . Smoking status: Never Smoker  . Smokeless tobacco: Never Used  . Tobacco comment: Quit 40years ago.   Vaping Use  . Vaping Use: Never used  Substance and Sexual Activity  . Alcohol use: Yes    Comment: "rarely"   . Drug use: Never  . Sexual activity: Not Currently  Other Topics Concern  . Not on file  Social History Narrative   ** Merged History Encounter **       Social Determinants of Health   Financial Resource Strain: Not on file  Food Insecurity: Not on file  Transportation Needs: Not on file  Physical Activity: Not on file  Stress: Not on file  Social Connections: Not on file  Intimate Partner Violence: Not on file   No family history on file. Current Outpatient Medications on File Prior to Visit  Medication Sig  . acetaminophen (TYLENOL) 500 MG tablet Take 1,000 mg by mouth every 6 (six) hours as needed for mild pain.  Marland Kitchen alendronate (FOSAMAX) 70 MG tablet TAKE 1 TABLET(70 MG) BY MOUTH 1 TIME A WEEK WITH A FULL GLASS OF WATER AND ON AN EMPTY STOMACH  . aspirin 325 MG tablet Take 1 tablet (325 mg total) by mouth daily.  Marland Kitchen azelastine (ASTELIN) 0.1 % nasal spray Place 1-2 sprays into both nostrils 2 (two) times daily as needed for allergies.   . cetirizine (ZYRTEC) 10 MG tablet Take 10 mg by mouth daily as needed for allergies.  Marland Kitchen EPINEPHrine 0.3 mg/0.3 mL IJ SOAJ injection Inject 0.3 mg into the muscle as needed for anaphylaxis (for allergy shots).  . ferrous sulfate 325 (65 FE) MG EC tablet Take 1 tablet (325 mg total) by mouth 3 (three) times daily with meals. (Patient not taking: Reported on 07/02/2020)  . fluticasone (FLONASE) 50 MCG/ACT nasal spray Place 2 sprays into both nostrils daily as needed for  allergies.  Marland Kitchen ketorolac (TORADOL) 10 MG tablet Take 1 tablet (10 mg total) by mouth every 6 (six) hours as needed.  . montelukast (SINGULAIR) 10 MG tablet Take 1 tablet (10 mg total) by mouth at bedtime. (Patient taking differently: Take 10 mg by mouth daily as needed (respiratory/allergies.).)  . Multiple Vitamin (MULTIVITAMIN WITH MINERALS) TABS tablet Take 1 tablet by mouth daily. One-A-Day Multivitamin   No current facility-administered medications on file prior to visit.    Review of Systems  Constitutional: Negative for activity change, appetite change, chills, diaphoresis, fatigue and fever.  HENT: Negative for congestion and hearing loss.   Eyes: Negative for visual disturbance.  Respiratory: Negative for cough, chest tightness, shortness of breath and wheezing.   Cardiovascular: Negative for chest pain, palpitations and leg swelling.  Gastrointestinal: Negative for abdominal pain, constipation, diarrhea,  nausea and vomiting.  Genitourinary: Negative for dysuria, frequency and hematuria.  Musculoskeletal: Negative for arthralgias and neck pain.  Skin: Negative for rash.  Neurological: Negative for dizziness, weakness, light-headedness, numbness and headaches.  Hematological: Negative for adenopathy.  Psychiatric/Behavioral: Negative for behavioral problems, dysphoric mood and sleep disturbance.   Per HPI unless specifically indicated above      Objective:    BP (!) 144/74 (BP Location: Left Arm, Cuff Size: Normal)   Pulse 71   Ht 5' 5.25" (1.657 m)   Wt 168 lb (76.2 kg)   SpO2 100%   BMI 27.74 kg/m   Wt Readings from Last 3 Encounters:  08/21/20 168 lb (76.2 kg)  07/02/20 170 lb 3.2 oz (77.2 kg)  06/11/20 178 lb (80.7 kg)    Physical Exam Vitals and nursing note reviewed.  Constitutional:      General: She is not in acute distress.    Appearance: She is well-developed. She is not diaphoretic.     Comments: Well-appearing, comfortable, cooperative  HENT:      Head: Normocephalic and atraumatic.  Eyes:     General:        Right eye: No discharge.        Left eye: No discharge.     Conjunctiva/sclera: Conjunctivae normal.     Pupils: Pupils are equal, round, and reactive to light.  Neck:     Thyroid: No thyromegaly.  Cardiovascular:     Rate and Rhythm: Normal rate and regular rhythm.     Heart sounds: Normal heart sounds. No murmur heard.   Pulmonary:     Effort: Pulmonary effort is normal. No respiratory distress.     Breath sounds: Normal breath sounds. No wheezing or rales.  Abdominal:     General: Bowel sounds are normal. There is no distension.     Palpations: Abdomen is soft. There is no mass.     Tenderness: There is no abdominal tenderness.  Musculoskeletal:        General: No tenderness. Normal range of motion.     Cervical back: Normal range of motion and neck supple.     Comments: Upper / Lower Extremities: - Normal muscle tone, strength bilateral upper extremities 5/5, lower extremities 5/5  Lymphadenopathy:     Cervical: No cervical adenopathy.  Skin:    General: Skin is warm and dry.     Findings: No erythema or rash.     Comments: Left lower ankle inner aspect with surgical scar and healing skin, no sign of open ulceration or infection. Mild edema localized only, slight tender. No erythema.  Neurological:     Mental Status: She is alert and oriented to person, place, and time.     Comments: Distal sensation intact to light touch all extremities  Psychiatric:        Behavior: Behavior normal.     Comments: Well groomed, good eye contact, normal speech and thoughts    Results for orders placed or performed during the hospital encounter of 06/11/20  MRSA PCR Screening   Specimen: Nasal Mucosa; Nasopharyngeal  Result Value Ref Range   MRSA by PCR NEGATIVE NEGATIVE  Resp Panel by RT-PCR (Flu A&B, Covid) Nasopharyngeal Swab   Specimen: Nasopharyngeal Swab; Nasopharyngeal(NP) swabs in vial transport medium  Result  Value Ref Range   SARS Coronavirus 2 by RT PCR NEGATIVE NEGATIVE   Influenza A by PCR NEGATIVE NEGATIVE   Influenza B by PCR NEGATIVE NEGATIVE  Aerobic/Anaerobic Culture (surgical/deep wound)  Specimen: Leg, Left; Wound  Result Value Ref Range   Specimen Description TISSUE    Special Requests LEFT LEG SPEC A    Gram Stain      FEW WBC PRESENT,BOTH PMN AND MONONUCLEAR NO ORGANISMS SEEN    Culture      RARE PSEUDOMONAS AERUGINOSA CRITICAL RESULT CALLED TO, READ BACK BY AND VERIFIED WITH: CYNTHIA LATHAM RN '@[id number]'  06/13/20 EB NO ANAEROBES ISOLATED Performed at [facility], [place] [address] [place], [place] [address]    Report Status 06/17/2020 FINAL    Organism ID, Bacteria PSEUDOMONAS AERUGINOSA       Susceptibility   Pseudomonas aeruginosa - MIC*    CEFTAZIDIME 4 SENSITIVE Sensitive     CIPROFLOXACIN <=0.25 SENSITIVE Sensitive     GENTAMICIN <=1 SENSITIVE Sensitive     IMIPENEM 1 SENSITIVE Sensitive     PIP/TAZO <=4 SENSITIVE Sensitive     CEFEPIME 2 SENSITIVE Sensitive     * RARE PSEUDOMONAS AERUGINOSA  Comprehensive metabolic panel  Result Value Ref Range   Sodium 138 135 - 145 mmol/L   Potassium 3.8 3.5 - 5.1 mmol/L   Chloride 103 98 - 111 mmol/L   CO2 25 22 - 32 mmol/L   Glucose, Bld 110 (H) 70 - 99 mg/dL   BUN 16 8 - 23 mg/dL   Creatinine, Ser 1.06 (H) 0.44 - 1.00 mg/dL   Calcium 10.8 (H) 8.9 - 10.3 mg/dL   Total Protein 7.7 6.5 - 8.1 g/dL   Albumin 4.8 3.5 - 5.0 g/dL   AST 17 15 - 41 U/L   ALT 16 0 - 44 U/L   Alkaline Phosphatase 64 38 - 126 U/L   Total Bilirubin 0.7 0.3 - 1.2 mg/dL   GFR, Estimated 59 (L) >60 mL/min   Anion gap 10 5 - 15  CBC with Differential  Result Value Ref Range   WBC 6.5 4.0 - 10.5 K/uL   RBC 4.27 3.87 - 5.11 MIL/uL   Hemoglobin 12.6 12.0 - 15.0 g/dL   HCT 40.0 36.0 - 46.0 %   MCV 93.7 80.0 - 100.0 fL   MCH 29.5 26.0 - 34.0 pg   MCHC 31.5 30.0 - 36.0 g/dL   RDW 12.6 11.5 - 15.5 %   Platelets 336 150 - 400 K/uL   nRBC 0.0 0.0  - 0.2 %   Neutrophils Relative % 67 %   Neutro Abs 4.4 1.7 - 7.7 K/uL   Lymphocytes Relative 24 %   Lymphs Abs 1.6 0.7 - 4.0 K/uL   Monocytes Relative 6 %   Monocytes Absolute 0.4 0.1 - 1.0 K/uL   Eosinophils Relative 1 %   Eosinophils Absolute 0.1 0.0 - 0.5 K/uL   Basophils Relative 1 %   Basophils Absolute 0.1 0.0 - 0.1 K/uL   Immature Granulocytes 1 %   Abs Immature Granulocytes 0.03 0.00 - 0.07 K/uL  Lactic acid, plasma  Result Value Ref Range   Lactic Acid, Venous 1.0 0.5 - 1.9 mmol/L  Lactic acid, plasma  Result Value Ref Range   Lactic Acid, Venous 0.9 0.5 - 1.9 mmol/L  Sedimentation rate  Result Value Ref Range   Sed Rate 19 0 - 22 mm/hr  C-reactive protein  Result Value Ref Range   CRP <0.5 <1.0 mg/dL  HIV Antibody (routine testing w rflx)  Result Value Ref Range   HIV Screen 4th Generation wRfx Non Reactive Non Reactive  CBC  Result Value Ref Range   WBC 6.1 4.0 -  10.5 K/uL   RBC 3.69 (L) 3.87 - 5.11 MIL/uL   Hemoglobin 10.9 (L) 12.0 - 15.0 g/dL   HCT 34.2 (L) 36.0 - 46.0 %   MCV 92.7 80.0 - 100.0 fL   MCH 29.5 26.0 - 34.0 pg   MCHC 31.9 30.0 - 36.0 g/dL   RDW 12.7 11.5 - 15.5 %   Platelets 290 150 - 400 K/uL   nRBC 0.0 0.0 - 0.2 %  Sedimentation rate  Result Value Ref Range   Sed Rate 11 0 - 22 mm/hr  C-reactive protein  Result Value Ref Range   CRP 0.6 <1.0 mg/dL      Assessment & Plan:   Problem List Items Addressed This Visit    Postoperative wound infection   Relevant Orders   Sed Rate (ESR)   C-reactive protein   Hyperlipidemia    Previously Controlled cholesterol on statin and lifestyle  Plan: 1. Continue current meds - Rosuvastatin 2. Continue ASA 325 3. Encourage improved lifestyle - low carb/cholesterol, reduce portion size, continue improving regular exercise Check lipids      Relevant Medications   amLODipine (NORVASC) 10 MG tablet   losartan (COZAAR) 50 MG tablet   rosuvastatin (CRESTOR) 10 MG tablet   Other Relevant Orders    Lipid panel   TSH   GERD (gastroesophageal reflux disease)   Relevant Orders   COMPLETE METABOLIC PANEL WITH GFR   Essential hypertension    Mildly elevated initial BP - w. Pain today from ankle - Home BP readings [id number] No known complications    Plan:  1. Continue Amlodipine 39m daily, Losartan 531mdaily 2. Encourage improved lifestyle - low sodium diet, regular exercise 3. Continue monitor BP outside office, bring readings to next visit, if persistently >140/90 or new symptoms notify office sooner      Relevant Medications   amLODipine (NORVASC) 10 MG tablet   losartan (COZAAR) 50 MG tablet   rosuvastatin (CRESTOR) 10 MG tablet   Other Relevant Orders   CBC with Differential/Platelet   COMPLETE METABOLIC PANEL WITH GFR    Other Visit Diagnoses    Annual physical exam    -  Primary   Relevant Orders   Hemoglobin A1c   CBC with Differential/Platelet   COMPLETE METABOLIC PANEL WITH GFR   Lipid panel   Screening mammogram for breast cancer       Relevant Orders   MM 3D SCREEN BREAST BILATERAL   Iron deficiency anemia, unspecified iron deficiency anemia type       Relevant Orders   CBC with Differential/Platelet   Iron, TIBC and Ferritin Panel   Abnormal glucose       Relevant Orders   Hemoglobin A1c   Vitamin D deficiency       Relevant Orders   VITAMIN D 25 Hydroxy (Vit-D Deficiency, Fractures)   Vitamin B12 deficiency       Relevant Orders   Vitamin B12   Bronchospasm       Relevant Medications   albuterol (VENTOLIN HFA) 108 (90 Base) MCG/ACT inhaler   Overweight (BMI 25.0-29.9)          Updated Health Maintenance information Mammogram ordered - screening to be scheduled when ready later in the year.  Check fasting labs today A1c, chemistry, CBC, Lipids and vitamin testing.  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 No further antibiotic recommended. May use voltaren topical Today she inquired about further testing. No new sign of infection on exam. I advised her that could not do a wound culture at this time since wound is healed, and no indication for blood culture, we could recheck ESR CRP and CBC and follow-up accordingly - may consider refer to ID locally in [place] if indicated.    Meds ordered this encounter  Medications  . amLODipine (NORVASC) 10 MG tablet    Sig: Take 1 tablet (10 mg total) by mouth daily.    Dispense:  90 tablet    Refill:  3  . losartan (COZAAR) 50 MG tablet    Sig: Take 1 tablet (50 mg total) by mouth daily.    Dispense:  90 tablet    Refill:  3  . rosuvastatin (CRESTOR) 10 MG tablet    Sig: Take 1 tablet (10 mg total) by mouth at bedtime.    Dispense:  90 tablet    Refill:  3  . albuterol (VENTOLIN HFA) 108 (90 Base) MCG/ACT inhaler    Sig: Inhale 2 puffs into the lungs every 4 (four) hours as needed for wheezing or shortness of breath (cough).    Dispense:  1 each    Refill:  3      Follow up plan: Return in about 6 months (around 02/21/2021) for 6 month follow-up HTN.  Nobie Putnam, [facility] 08/21/2020, 8:29 AM

## 2020-08-21 NOTE — Therapy (Signed)
[facility] [address] [place], [address] Phone: [phone]   Fax:  [phone]  Physical Therapy Treatment  Patient Details  Name: Kendra Allison MRN: [id number] Date of Birth: 1956/01/18 No data recorded  Encounter Date: 08/21/2020   PT End of Session - 08/21/20 1207    Visit Number 14    Number of Visits 17    Date for PT Re-Evaluation 09/06/20    Authorization Type [employer] Federal PPO    Authorization Time Period 07/09/20-09/06/20    Authorization - Visit Number 14    Authorization - Number of Visits 17    PT Start Time 1030    PT Stop Time 1115    PT Time Calculation (min) 45 min    Activity Tolerance Patient tolerated treatment well;No increased pain    Behavior During Therapy WFL for tasks assessed/performed           Past Medical History:  Diagnosis Date  . Asthma   . GERD (gastroesophageal reflux disease)   . Hypercholesteremia   . Hyperlipidemia   . Hypertension   . Osteoporosis     Past Surgical History:  Procedure Laterality Date  . ABDOMINAL HYSTERECTOMY    . BREAST BIOPSY    . COLONOSCOPY WITH PROPOFOL N/A 08/10/2019   Procedure: COLONOSCOPY WITH PROPOFOL;  Surgeon: Jonathon Bellows, MD;  Location: [facility] ENDOSCOPY;  Service: Gastroenterology;  Laterality: N/A;  . ESOPHAGOGASTRODUODENOSCOPY (EGD) WITH PROPOFOL N/A 08/10/2019   Procedure: ESOPHAGOGASTRODUODENOSCOPY (EGD) WITH PROPOFOL;  Surgeon: Jonathon Bellows, MD;  Location: [facility] ENDOSCOPY;  Service: Gastroenterology;  Laterality: N/A;  . EXTERNAL FIXATION LEG Left 04/07/2020    EXTERNAL FIXATION LEG (Left Leg Lower)  . EXTERNAL FIXATION LEG Left 04/07/2020   Procedure: EXTERNAL FIXATION LEG;  Surgeon: Shona Needles, MD;  Location: [place];  Service: Orthopedics;  Laterality: Left;  . EXTERNAL FIXATION REMOVAL Left 04/10/2020   Procedure: REMOVAL EXTERNAL FIXATION LEG;  Surgeon: Shona Needles, MD;  Location: [place];  Service: Orthopedics;   Laterality: Left;  . I & D EXTREMITY Left 04/07/2020   Procedure: IRRIGATION AND DEBRIDEMENT EXTREMITY;  Surgeon: Shona Needles, MD;  Location: [place];  Service: Orthopedics;  Laterality: Left;  . I & D EXTREMITY Left 06/12/2020   Procedure: IRRIGATION AND DEBRIDEMENT EXTREMITY;  Surgeon: Shona Needles, MD;  Location: [place];  Service: Orthopedics;  Laterality: Left;  . [place]  2014  . TIBIA IM NAIL INSERTION Left 04/10/2020   Procedure: INTRAMEDULLARY (IM) NAIL TIBIAL;  Surgeon: Shona Needles, MD;  Location: [place];  Service: Orthopedics;  Laterality: Left;    There were no vitals filed for this visit.   Subjective Assessment - 08/21/20 1033    Subjective Patient reports 2/10 pain in the ankle. Patient reports she is able to walk without cane at home for small distances. Patient reports she spoke to her doctor about pain on medial ankle around scar and MD recommended a lidocain cream which has been improving symptoms.    Patient is accompained by: Family member    Pertinent History Pt is 65 y.o s/p L initial I&D with external fixation 04/07/20, then transitioned to ORIF with IM nail of left proximal tibia, and distal tibia/fibula 04/10/20. Subsequent irrigation and drainage of wound 06/11/20. Currently in post op shoe, reporting that surgeon advised her to seek PT opinion on less restrictive shoe option. Is currently unable to wear normal shoes due to swelling. Wearing boot throughout entire  day, using RW for ambulation (previously ind without AD), is WBAT per MD notes- though patient reports she cannot tolerate hardly any pressure at this time, and per patient without any other surgical precautions. Is having stitches removed 07/16/20. Uses a bone stimulator multiple times per day (per Xrays delayed healing of distal tibia and fibula fracture). Pain is currently 2/10 worst 4/10. Pain is aggrevated mainly with pronlonged walking. Pain is eased with ice and rest and motrin prn. Pain in the morning is  fine and only gets worst depending on amount of walking done around the house throughout the day. Activities impaired cooking, cleaning, grocery shopping, bathing, lifting. Pt is currently retired. Pt would like to get back to normal walking without any assisstive device. Denies any N/T, B/B changes, weight changes, night sweats/pain.    Limitations Walking;House hold activities    How long can you sit comfortably? unlimited    How long can you stand comfortably? unlimited    How long can you walk comfortably? 1-2 minutes    Patient Stated Goals Get back to normal walking without an assisstive device    Currently in Pain? Yes    Pain Score 2     Pain Location Ankle    Pain Orientation Left    Pain Onset More than a month ago             Therex:   Nustep LEsetting=7,Level6 for gentle motion and strengthening; no use of UE to promote weightbearing of LLE10mins  Single leg calf raise on stairs  with heels hanging off edge of step to increase ROM and work on muscle strength, hold in DF stretch ~5seconds and then raise, 1 set of 12 standing on both legs, single leg standing on LLE 3 sets of 10 with standing gastroch stretch on step between sets   Single leg calf raise on total gym, 3 sets of 10 with cueing to keep knee straight and  Move through motion slowly    Gait training:   Treadmill 5 minutes 1.6 speed 2 incline, rested, and then repeated for 3 minutes: cueing to extend L knee at heel strike, and to increase step length bilat R>L; tactile cues required to guide knee ext with heel strike ofLLE    Backwards walking 30 feet cueing to initiate with forefoot with great toe ext and push heel down with knee extended (reports stretch on step back)  Manual Therapy:  DF muscle energy in prone, patient pushed into PF submaximally then relaxed to achieve new motion, repeated three times                               PT Education - 08/21/20 1207     Education Details therex form, gait performance    Person(s) Educated Patient    Methods Demonstration;Verbal cues;Explanation    Comprehension Verbalized understanding;Returned demonstration;Verbal cues required            PT Short Term Goals - 07/09/20 1023      PT SHORT TERM GOAL #1   Title Pt will be independent with HEP in order to increase ankle mobility and strength and balance in order to perform household ADLs safely.    Baseline 07/09/20 HEP given    Time 4    Period Weeks    Status New             PT Long Term Goals - 08/07/20 0258  PT LONG TERM GOAL #1   Title Pt will increase gait speed to at least 0.64m/s to demonstrate safe, normal community speed    Baseline 07/09/20 0.31; 08/07/20 0.82m/s    Time 8    Period Weeks    Status On-going      PT LONG TERM GOAL #2   Title Pt will demonstrate at least 10 LLE heel raises to demonstrate baseline PF strength in order to particpate in safe community activities and household ADLs    Baseline 07/09/20 R -10 L - unable to test    Time 8    Period Weeks    Status Deferred      PT LONG TERM GOAL #3   Title Pt will increase L SL stance to at least 8secs in order to demonstrate baseline static balance, needed to demonstrate decreased fall risk    Baseline 07/09/20 R 8 sec L - unable to test; 08/07/20 LLE 1sec    Time 8    Period Weeks    Status On-going      PT LONG TERM GOAL #4   Title Pt will demonstrate ind ambulation without AD with normalized gait mechanics to demonstrate return to PLOF and safety with community ambulation.    Baseline 07/09/20 with RW: step to gait with minimal RLE stance, decreased LLE step length, minimal WB on RLE; 08/07/20 walks with [facility] with decreased L stance R step length    Time 8    Period Weeks    Status On-going      PT LONG TERM GOAL #5   Title Pt will increase L ankle DF A/PROM to at least 20 degrees to demonstrate normalized ankle mobility need for functional squatting and  normalized gait mechanics    Baseline 07/09/20 A- 9d P- 11d; 08/07/20 PROM: 3 AROM: 0    Time 8    Period Weeks    Status On-going      Additional Long Term Goals   Additional Long Term Goals Yes      PT LONG TERM GOAL #6   Title Patient will increase FOTO score to 65 to demonstrate predicted increase in functional mobility to complete ADLs    Baseline 07/09/20 49; 08/07/20 65    Time 8    Period Weeks    Status New                 Plan - 08/21/20 1208    Clinical Impression Statement PT continued manual therapy, therex progression, and gait training to increase L ankle strength and mobility. Patient was able to demonstrate proper technique after initial demonstration with verbal and tactile cues. Patient tolerated mobilizations well and demonstrated increased ROM. Patient was able to improve gait pattern with tactile cues and is motivated to continue practicing walking at home. PT will continue progressions to tolerance.    Personal Factors and Comorbidities Fitness;Past/Current Experience;Comorbidity 1    Examination-Activity Limitations Bathing;Lift;Locomotion Level;Stand;Stairs;Other    Examination-Participation Restrictions Cleaning;Shop;Community Activity    Stability/Clinical Decision Making Evolving/Moderate complexity    Clinical Decision Making Moderate    Rehab Potential Good    PT Frequency 2x / week    PT Duration 8 weeks    PT Treatment/Interventions ADLs/Self Care Home Management;Biofeedback;Cryotherapy;Ultrasound;Moist Heat;Iontophoresis 4mg /ml Dexamethasone;Electrical Stimulation;Gait training;Stair training;Functional mobility training;Neuromuscular re-education;Balance training;Therapeutic exercise;Therapeutic activities;Patient/family education;Orthotic Fit/Training;Manual techniques;Dry needling;Passive range of motion;Scar mobilization;Compression bandaging;Manual lymph drainage;Energy conservation;Taping    PT Next Visit Plan gait training, progress exercises     PT Home Exercise Plan SL  stance, dorsiflexion stetch, seated plantar flexion    Consulted and Agree with Plan of Care Patient           Patient will benefit from skilled therapeutic intervention in order to improve the following deficits and impairments:  Abnormal gait,Decreased activity tolerance,Decreased balance,Decreased endurance,Decreased coordination,Decreased mobility,Decreased range of motion,Difficulty walking,Decreased strength,Decreased scar mobility,Decreased skin integrity,Hypomobility,Increased edema,Impaired flexibility,Increased fascial restricitons,Impaired sensation,Impaired tone,Impaired UE functional use,Pain,Improper body mechanics,Postural dysfunction  Visit Diagnosis: Pain in left ankle and joints of left foot  Stiffness of left ankle, not elsewhere classified     Problem List Patient Active Problem List   Diagnosis Date Noted  . Postoperative wound infection 06/11/2020  . Surgical site infection 06/11/2020  . Asthma   . GERD (gastroesophageal reflux disease)   . Hypertension   . Fracture of tibia and fibula, shaft, left, open type III, initial encounter 04/10/2020  . Displaced comminuted fracture of shaft of left fibula, initial encounter for closed fracture 04/10/2020  . Fall from height of greater than 3 feet 04/10/2020  . Open displaced pilon fracture of left tibia 04/07/2020  . Hyperlipidemia   . Mild concentric left ventricular hypertrophy (LVH) 03/09/2019  . Essential hypertension 03/01/2019      Durwin Reges DPT [address] SPT  Durwin Reges 08/21/2020, 1:55 PM  [place] [facility] [address], [place], [address] Phone: [phone]   Fax:  [phone]  Name: Kendra Allison MRN: [id number] Date of Birth: 19-Sep-1955

## 2020-08-21 NOTE — Assessment & Plan Note (Signed)
Mildly elevated initial BP - w. Pain today from ankle - Home BP readings [id number] No known complications    Plan:  1. Continue Amlodipine 10mg  daily, Losartan 50mg  daily 2. Encourage improved lifestyle - low sodium diet, regular exercise 3. Continue monitor BP outside office, bring readings to next visit, if persistently >140/90 or new symptoms notify office sooner

## 2020-08-21 NOTE — Assessment & Plan Note (Signed)
Previously Controlled cholesterol on statin and lifestyle  Plan: 1. Continue current meds - Rosuvastatin 2. Continue ASA 325 3. Encourage improved lifestyle - low carb/cholesterol, reduce portion size, continue improving regular exercise Check lipids

## 2020-08-21 NOTE — Patient Instructions (Addendum)
Thank you for coming to the office today.  Try Voltaren topical cream/gel on the left ankle area that is healing 1 to 4 times a day can help with inflammation and pain, use as needed only.   Labs ordered to re-check for signs of infection and anemia. And other routine labs. Stay tuned for results.  Recommend to start taking Tylenol Extra Strength 500mg  tabs - take 1 to 2 tabs per dose (max 1000mg ) every 6-8 hours for pain (take regularly, don't skip a dose for next 7 days), max 24 hour daily dose is 6 tablets or 3000mg . In the future you can repeat the same everyday Tylenol course for 1-2 weeks at a time.  Recommend trial of Anti-inflammatory with Aleve OTC / Naproxen 250 x2 = 500mg  tabs - take one with food and plenty of water TWICE daily every day (breakfast and dinner), for next 1-2 weeks, then you may take only as needed - DO NOT TAKE any ibuprofen, advil, motrin while you are taking this medicine - It is safe to take Tylenol Ext Str 500mg  tabs - take 1 to 2 (max dose 1000mg ) every 6 hours as needed for breakthrough pain, max 24 hour daily dose is 6 to 8 tablets or 4000mg     Please schedule a Follow-up Appointment to: Return in about 6 months (around 02/21/2021) for 6 month follow-up HTN.  If you have any other questions or concerns, please feel free to call the office or send a message through [facility]. You may also schedule an earlier appointment if necessary.  Additionally, you may be receiving a survey about your experience at our office within a few days to 1 week by e-mail or mail. We value your feedback.  Nobie Putnam, DO [place]

## 2020-08-22 LAB — CBC WITH DIFFERENTIAL/PLATELET
Absolute Monocytes: 418 cells/uL (ref 200–950)
Basophils Absolute: 61 cells/uL (ref 0–200)
Basophils Relative: 1.3 %
Eosinophils Absolute: 89 cells/uL (ref 15–500)
Eosinophils Relative: 1.9 %
HCT: 39.3 % (ref 35.0–45.0)
Hemoglobin: 13 g/dL (ref 11.7–15.5)
Lymphs Abs: 1349 cells/uL (ref 850–3900)
MCH: 29.9 pg (ref 27.0–33.0)
MCHC: 33.1 g/dL (ref 32.0–36.0)
MCV: 90.3 fL (ref 80.0–100.0)
MPV: 10.2 fL (ref 7.5–12.5)
Monocytes Relative: 8.9 %
Neutro Abs: 2782 cells/uL (ref 1500–7800)
Neutrophils Relative %: 59.2 %
Platelets: 403 10*3/uL — ABNORMAL HIGH (ref 140–400)
RBC: 4.35 10*6/uL (ref 3.80–5.10)
RDW: 12.8 % (ref 11.0–15.0)
Total Lymphocyte: 28.7 %
WBC: 4.7 10*3/uL (ref 3.8–10.8)

## 2020-08-22 LAB — LIPID PANEL
Cholesterol: 141 mg/dL (ref <200)
HDL: 52 mg/dL (ref >=50)
LDL Cholesterol (Calc): 71 mg/dL (calc)
Non-HDL Cholesterol (Calc): 89 mg/dL (calc) (ref <130)
Total CHOL/HDL Ratio: 2.7 (calc) (ref <5.0)
Triglycerides: 98 mg/dL (ref <150)

## 2020-08-22 LAB — COMPLETE METABOLIC PANEL WITH GFR
AG Ratio: 2.2 (calc) (ref 1.0–2.5)
ALT: 12 U/L (ref 6–29)
AST: 16 U/L (ref 10–35)
Albumin: 5.2 g/dL — ABNORMAL HIGH (ref 3.6–5.1)
Alkaline phosphatase (APISO): 87 U/L (ref 37–153)
BUN: 14 mg/dL (ref 7–25)
CO2: 29 mmol/L (ref 20–32)
Calcium: 10.6 mg/dL — ABNORMAL HIGH (ref 8.6–10.4)
Chloride: 103 mmol/L (ref 98–110)
Creat: 0.86 mg/dL (ref 0.50–0.99)
GFR, Est African American: 83 mL/min/{1.73_m2} (ref >=60)
GFR, Est Non African American: 71 mL/min/{1.73_m2} (ref >=60)
Globulin: 2.4 g/dL (calc) (ref 1.9–3.7)
Glucose, Bld: 87 mg/dL (ref 65–99)
Potassium: 4.3 mmol/L (ref 3.5–5.3)
Sodium: 141 mmol/L (ref 135–146)
Total Bilirubin: 0.6 mg/dL (ref 0.2–1.2)
Total Protein: 7.6 g/dL (ref 6.1–8.1)

## 2020-08-22 LAB — IRON,TIBC AND FERRITIN PANEL
%SAT: 24 % (calc) (ref 16–45)
Ferritin: 45 ng/mL (ref 16–288)
Iron: 87 ug/dL (ref 45–160)
TIBC: 357 mcg/dL (calc) (ref 250–450)

## 2020-08-22 LAB — C-REACTIVE PROTEIN: CRP: 0.5 mg/L (ref <8.0)

## 2020-08-22 LAB — TSH: TSH: 1.74 mIU/L (ref 0.40–4.50)

## 2020-08-22 LAB — VITAMIN B12: Vitamin B-12: 1367 pg/mL — ABNORMAL HIGH (ref 200–1100)

## 2020-08-22 LAB — HEMOGLOBIN A1C
Hgb A1c MFr Bld: 5.3 % of total Hgb (ref <5.7)
Mean Plasma Glucose: 105 mg/dL
eAG (mmol/L): 5.8 mmol/L

## 2020-08-22 LAB — VITAMIN D 25 HYDROXY (VIT D DEFICIENCY, FRACTURES): Vit D, 25-Hydroxy: 44 ng/mL (ref 30–100)

## 2020-08-22 LAB — SEDIMENTATION RATE: Sed Rate: 6 mm/h (ref 0–30)

## 2020-08-26 ENCOUNTER — Ambulatory Visit: Payer: Federal, State, Local not specified - PPO | Attending: Student | Admitting: Physical Therapy

## 2020-08-26 ENCOUNTER — Other Ambulatory Visit: Payer: Self-pay

## 2020-08-26 ENCOUNTER — Encounter: Payer: Self-pay | Admitting: Physical Therapy

## 2020-08-26 DIAGNOSIS — M25672 Stiffness of left ankle, not elsewhere classified: Secondary | ICD-10-CM | POA: Diagnosis not present

## 2020-08-26 DIAGNOSIS — M25572 Pain in left ankle and joints of left foot: Secondary | ICD-10-CM

## 2020-08-26 NOTE — Therapy (Signed)
[facility] [address] [place], [address] Phone: [phone]   Fax:  [phone]  Physical Therapy Treatment  Patient Details  Name: Kendra Allison MRN: [id number] Date of Birth: 11-Nov-1955 No data recorded  Encounter Date: 08/26/2020   PT End of Session - 08/26/20 1227    Visit Number 15    Number of Visits 17    Date for PT Re-Evaluation 09/06/20    Authorization Type [employer] Federal PPO    Authorization Time Period 07/09/20-09/06/20    Authorization - Visit Number 15    Authorization - Number of Visits 17    PT Start Time 1030    PT Stop Time 1115    PT Time Calculation (min) 45 min    Activity Tolerance Patient tolerated treatment well;No increased pain    Behavior During Therapy WFL for tasks assessed/performed           Past Medical History:  Diagnosis Date  . Asthma   . GERD (gastroesophageal reflux disease)   . Hypercholesteremia   . Hyperlipidemia   . Hypertension   . Osteoporosis     Past Surgical History:  Procedure Laterality Date  . ABDOMINAL HYSTERECTOMY    . BREAST BIOPSY    . COLONOSCOPY WITH PROPOFOL N/A 08/10/2019   Procedure: COLONOSCOPY WITH PROPOFOL;  Surgeon: Jonathon Bellows, MD;  Location: [facility] ENDOSCOPY;  Service: Gastroenterology;  Laterality: N/A;  . ESOPHAGOGASTRODUODENOSCOPY (EGD) WITH PROPOFOL N/A 08/10/2019   Procedure: ESOPHAGOGASTRODUODENOSCOPY (EGD) WITH PROPOFOL;  Surgeon: Jonathon Bellows, MD;  Location: [facility] ENDOSCOPY;  Service: Gastroenterology;  Laterality: N/A;  . EXTERNAL FIXATION LEG Left 04/07/2020    EXTERNAL FIXATION LEG (Left Leg Lower)  . EXTERNAL FIXATION LEG Left 04/07/2020   Procedure: EXTERNAL FIXATION LEG;  Surgeon: Shona Needles, MD;  Location: [place];  Service: Orthopedics;  Laterality: Left;  . EXTERNAL FIXATION REMOVAL Left 04/10/2020   Procedure: REMOVAL EXTERNAL FIXATION LEG;  Surgeon: Shona Needles, MD;  Location: [place];  Service: Orthopedics;   Laterality: Left;  . I & D EXTREMITY Left 04/07/2020   Procedure: IRRIGATION AND DEBRIDEMENT EXTREMITY;  Surgeon: Shona Needles, MD;  Location: [place];  Service: Orthopedics;  Laterality: Left;  . I & D EXTREMITY Left 06/12/2020   Procedure: IRRIGATION AND DEBRIDEMENT EXTREMITY;  Surgeon: Shona Needles, MD;  Location: [place];  Service: Orthopedics;  Laterality: Left;  . Nissem  2014  . TIBIA IM NAIL INSERTION Left 04/10/2020   Procedure: INTRAMEDULLARY (IM) NAIL TIBIAL;  Surgeon: Shona Needles, MD;  Location: [place];  Service: Orthopedics;  Laterality: Left;    There were no vitals filed for this visit.   Subjective Assessment - 08/26/20 1029    Subjective Patient reports 3/10 pain in the ankle.Patient reports she did some forward and backward walking without cane. She reports she was able to do yardwork without the cane.    Pertinent History Pt is 65 y.o s/p L initial I&D with external fixation 04/07/20, then transitioned to ORIF with IM nail of left proximal tibia, and distal tibia/fibula 04/10/20. Subsequent irrigation and drainage of wound 06/11/20. Currently in post op shoe, reporting that surgeon advised her to seek PT opinion on less restrictive shoe option. Is currently unable to wear normal shoes due to swelling. Wearing boot throughout entire day, using RW for ambulation (previously ind without AD), is WBAT per MD notes- though patient reports she cannot tolerate hardly any pressure at this time, and  per patient without any other surgical precautions. Is having stitches removed 07/16/20. Uses a bone stimulator multiple times per day (per Xrays delayed healing of distal tibia and fibula fracture). Pain is currently 2/10 worst 4/10. Pain is aggrevated mainly with pronlonged walking. Pain is eased with ice and rest and motrin prn. Pain in the morning is fine and only gets worst depending on amount of walking done around the house throughout the day. Activities impaired cooking, cleaning, grocery  shopping, bathing, lifting. Pt is currently retired. Pt would like to get back to normal walking without any assisstive device. Denies any N/T, B/B changes, weight changes, night sweats/pain.    Limitations Walking;House hold activities    How long can you sit comfortably? unlimited    How long can you stand comfortably? unlimited    How long can you walk comfortably? 1-2 minutes    Patient Stated Goals Get back to normal walking without an assisstive device    Currently in Pain? Yes    Pain Score 3     Pain Location Ankle    Pain Orientation Left    Pain Descriptors / Indicators Dull;Tightness;Sore    Pain Type Chronic pain    Pain Onset More than a month ago    Pain Frequency Constant          Therex:   Nustep LEsetting=7,Level6 for gentle motion and strengthening; no use of UE to promote weightbearing of LLE8mins  Single leg calf raise with UE assistance - 3 sets of 10, cueing for eccentric control and decreased UE assistance   SLS on foam pad with two finger assist, 15 second hold repeated three times, minimal LOB    SLS with toe touch assistance on foam pad reaching outside BOS to tap therapist's hand, minimal LOB and able to regain balance easily   Gait training:   Hurdle step overs with both legs to encourage heel strike and push off on LLE. 2 sets of 6 bilaterally   Treadmill 5 minutes 1.6 speed 2 incline: cueing to increase step length bilat R>L; tactile cues required to guide push off of great toe   Walking without cane 150 feet - cueing to increase step length, encourage heel strike and push off of toes   Manual Therapy:   ME for DF to increase ROM, repeated twice   G4 AP glides in PF with foot resting table - 30 seconds then move foot out slightly and repeated 30 seconds, repeated twice        PT Education - 08/26/20 1035    Education Details therex form, gait    Person(s) Educated Patient    Methods Explanation;Demonstration;Verbal cues;Tactile  cues    Comprehension Verbalized understanding;Returned demonstration;Verbal cues required;Tactile cues required            PT Short Term Goals - 07/09/20 1023      PT SHORT TERM GOAL #1   Title Pt will be independent with HEP in order to increase ankle mobility and strength and balance in order to perform household ADLs safely.    Baseline 07/09/20 HEP given    Time 4    Period Weeks    Status New             PT Long Term Goals - 08/07/20 0953      PT LONG TERM GOAL #1   Title Pt will increase gait speed to at least 0.27m/s to demonstrate safe, normal community speed    Baseline 07/09/20 0.31; 08/07/20  0.49m/s    Time 8    Period Weeks    Status On-going      PT LONG TERM GOAL #2   Title Pt will demonstrate at least 10 LLE heel raises to demonstrate baseline PF strength in order to particpate in safe community activities and household ADLs    Baseline 07/09/20 R -10 L - unable to test    Time 8    Period Weeks    Status Deferred      PT LONG TERM GOAL #3   Title Pt will increase L SL stance to at least 8secs in order to demonstrate baseline static balance, needed to demonstrate decreased fall risk    Baseline 07/09/20 R 8 sec L - unable to test; 08/07/20 LLE 1sec    Time 8    Period Weeks    Status On-going      PT LONG TERM GOAL #4   Title Pt will demonstrate ind ambulation without AD with normalized gait mechanics to demonstrate return to PLOF and safety with community ambulation.    Baseline 07/09/20 with RW: step to gait with minimal RLE stance, decreased LLE step length, minimal WB on RLE; 08/07/20 walks with [facility] with decreased L stance R step length    Time 8    Period Weeks    Status On-going      PT LONG TERM GOAL #5   Title Pt will increase L ankle DF A/PROM to at least 20 degrees to demonstrate normalized ankle mobility need for functional squatting and normalized gait mechanics    Baseline 07/09/20 A- 9d P- 11d; 08/07/20 PROM: 3 AROM: 0    Time 8    Period  Weeks    Status On-going      Additional Long Term Goals   Additional Long Term Goals Yes      PT LONG TERM GOAL #6   Title Patient will increase FOTO score to 65 to demonstrate predicted increase in functional mobility to complete ADLs    Baseline 07/09/20 49; 08/07/20 65    Time 8    Period Weeks    Status New                 Plan - 08/26/20 1228    Clinical Impression Statement PT continued manual therapy, therex progression to address balance and strength, and gait training to normalize gait. Patient was able to demonstrate proper technique with verbal and tactile cues. Patient tolerated mobilizations well. Patient is eager to walk without cane and motivated to walk with proper technique but does require verbal cues to increase step length bilaterally and increase push off. Patient is motivated to continue practicing walking at home. PT will continue to progresss therex and gait training to patient tolerance.    Personal Factors and Comorbidities Fitness;Past/Current Experience;Comorbidity 1    Examination-Activity Limitations Bathing;Lift;Locomotion Level;Stand;Stairs;Other    Examination-Participation Restrictions Cleaning;Shop;Community Activity    Stability/Clinical Decision Making Evolving/Moderate complexity    Clinical Decision Making Moderate    Rehab Potential Good    PT Frequency 2x / week    PT Duration 8 weeks    PT Treatment/Interventions ADLs/Self Care Home Management;Biofeedback;Cryotherapy;Ultrasound;Moist Heat;Iontophoresis 4mg /ml Dexamethasone;Electrical Stimulation;Gait training;Stair training;Functional mobility training;Neuromuscular re-education;Balance training;Therapeutic exercise;Therapeutic activities;Patient/family education;Orthotic Fit/Training;Manual techniques;Dry needling;Passive range of motion;Scar mobilization;Compression bandaging;Manual lymph drainage;Energy conservation;Taping    PT Next Visit Plan gait training, progress exercises    PT Home  Exercise Plan SL stance, dorsiflexion stetch, seated plantar flexion    Consulted and Agree  with Plan of Care Patient           Patient will benefit from skilled therapeutic intervention in order to improve the following deficits and impairments:  Abnormal gait,Decreased activity tolerance,Decreased balance,Decreased endurance,Decreased coordination,Decreased mobility,Decreased range of motion,Difficulty walking,Decreased strength,Decreased scar mobility,Decreased skin integrity,Hypomobility,Increased edema,Impaired flexibility,Increased fascial restricitons,Impaired sensation,Impaired tone,Impaired UE functional use,Pain,Improper body mechanics,Postural dysfunction  Visit Diagnosis: Pain in left ankle and joints of left foot  Stiffness of left ankle, not elsewhere classified     Problem List Patient Active Problem List   Diagnosis Date Noted  . Postoperative wound infection 06/11/2020  . Surgical site infection 06/11/2020  . Asthma   . GERD (gastroesophageal reflux disease)   . Hypertension   . Fracture of tibia and fibula, shaft, left, open type III, initial encounter 04/10/2020  . Displaced comminuted fracture of shaft of left fibula, initial encounter for closed fracture 04/10/2020  . Fall from height of greater than 3 feet 04/10/2020  . Open displaced pilon fracture of left tibia 04/07/2020  . Hyperlipidemia   . Mild concentric left ventricular hypertrophy (LVH) 03/09/2019  . Essential hypertension 03/01/2019     Durwin Reges DPT [address] [place]  Durwin Reges 08/26/2020, 3:44 PM  [place] [facility] [address], [place], [address] Phone: [phone]   Fax:  [phone]  Name: Otilia Kareem MRN: [id number] Date of Birth: [birth year]

## 2020-08-28 ENCOUNTER — Ambulatory Visit: Payer: Federal, State, Local not specified - PPO | Admitting: Physical Therapy

## 2020-08-28 ENCOUNTER — Other Ambulatory Visit: Payer: Self-pay

## 2020-08-28 ENCOUNTER — Encounter: Payer: Self-pay | Admitting: Physical Therapy

## 2020-08-28 DIAGNOSIS — M25672 Stiffness of left ankle, not elsewhere classified: Secondary | ICD-10-CM

## 2020-08-28 DIAGNOSIS — M25572 Pain in left ankle and joints of left foot: Secondary | ICD-10-CM

## 2020-08-28 NOTE — Therapy (Signed)
[place] [facility] [address] [place], [address] Phone: [phone]   Fax:  [phone]  Physical Therapy Treatment  Patient Details  Name: Kendra Allison MRN: [id number] Date of Birth: 09-20-1955 No data recorded  Encounter Date: 08/28/2020  Past Medical History:  Diagnosis Date  . Asthma   . GERD (gastroesophageal reflux disease)   . Hypercholesteremia   . Hyperlipidemia   . Hypertension   . Osteoporosis     Past Surgical History:  Procedure Laterality Date  . ABDOMINAL HYSTERECTOMY    . BREAST BIOPSY    . COLONOSCOPY WITH PROPOFOL N/A 08/10/2019   Procedure: COLONOSCOPY WITH PROPOFOL;  Surgeon: Jonathon Bellows, MD;  Location: [facility] ENDOSCOPY;  Service: Gastroenterology;  Laterality: N/A;  . ESOPHAGOGASTRODUODENOSCOPY (EGD) WITH PROPOFOL N/A 08/10/2019   Procedure: ESOPHAGOGASTRODUODENOSCOPY (EGD) WITH PROPOFOL;  Surgeon: Jonathon Bellows, MD;  Location: [facility] ENDOSCOPY;  Service: Gastroenterology;  Laterality: N/A;  . EXTERNAL FIXATION LEG Left 04/07/2020    EXTERNAL FIXATION LEG (Left Leg Lower)  . EXTERNAL FIXATION LEG Left 04/07/2020   Procedure: EXTERNAL FIXATION LEG;  Surgeon: Shona Needles, MD;  Location: [place];  Service: Orthopedics;  Laterality: Left;  . EXTERNAL FIXATION REMOVAL Left 04/10/2020   Procedure: REMOVAL EXTERNAL FIXATION LEG;  Surgeon: Shona Needles, MD;  Location: [place];  Service: Orthopedics;  Laterality: Left;  . I & D EXTREMITY Left 04/07/2020   Procedure: IRRIGATION AND DEBRIDEMENT EXTREMITY;  Surgeon: Shona Needles, MD;  Location: [place];  Service: Orthopedics;  Laterality: Left;  . I & D EXTREMITY Left 06/12/2020   Procedure: IRRIGATION AND DEBRIDEMENT EXTREMITY;  Surgeon: Shona Needles, MD;  Location: [place];  Service: Orthopedics;  Laterality: Left;  . Nissem  2014  . TIBIA IM NAIL INSERTION Left 04/10/2020   Procedure: INTRAMEDULLARY (IM) NAIL TIBIAL;  Surgeon: Shona Needles, MD;   Location: [place];  Service: Orthopedics;  Laterality: Left;    There were no vitals filed for this visit.   Therex:   Nustep LEsetting=7,Level6 for gentle motion and strengthening; no use of UE to promote weightbearing of LLE47mins  Standing DF on 8 in step, holding stretch 5 seconds, 15 times, repeated three times, cueing to bend at knee instead of using trunk     PF/DF on bosu ball 3 sets of 10, cueing to keep hips in neutral and move at the ankle   Heel strike and push off exercise:  Standing between two cones on RLE and swinging LLE to heel strike and toe off, good carryover after initial demonstration   Gait training:   Treadmill 5 minutes: 1.6 speed 3 incline, rest 1 min, and then repeat for 2 minutes: cueing to increase step length on the R, better demonstration of push off and heel strike on LLE than previous session    Subjective Assessment - 08/28/20 1029    Subjective Patient reports 2/10 pain in the ankle. Patient reports she was able to walk down the street without her cane but occassionally loses her balance when she tries to take big steps. Patient reports no falls, just occasional LOB.    Pertinent History Pt is 65 y.o s/p L initial I&D with external fixation 04/07/20, then transitioned to ORIF with IM nail of left proximal tibia, and distal tibia/fibula 04/10/20. Subsequent irrigation and drainage of wound 06/11/20. Currently in post op shoe, reporting that surgeon advised her to seek PT opinion on less restrictive shoe option. Is currently unable  to wear normal shoes due to swelling. Wearing boot throughout entire day, using RW for ambulation (previously ind without AD), is WBAT per MD notes- though patient reports she cannot tolerate hardly any pressure at this time, and per patient without any other surgical precautions. Is having stitches removed 07/16/20. Uses a bone stimulator multiple times per day (per Xrays delayed healing of distal tibia and fibula fracture).  Pain is currently 2/10 worst 4/10. Pain is aggrevated mainly with pronlonged walking. Pain is eased with ice and rest and motrin prn. Pain in the morning is fine and only gets worst depending on amount of walking done around the house throughout the day. Activities impaired cooking, cleaning, grocery shopping, bathing, lifting. Pt is currently retired. Pt would like to get back to normal walking without any assisstive device. Denies any N/T, B/B changes, weight changes, night sweats/pain.    Limitations Walking;House hold activities    How long can you sit comfortably? unlimited    How long can you stand comfortably? unlimited    How long can you walk comfortably? 1-2 minutes    Patient Stated Goals Get back to normal walking without an assisstive device    Currently in Pain? Yes    Pain Score 2     Pain Location Ankle    Pain Orientation Left             PT Education - 08/28/20 1249    Education Details therex form/technque, gait    Person(s) Educated Patient    Methods Explanation;Tactile cues;Demonstration    Comprehension Verbalized understanding;Verbal cues required;Returned demonstration            PT Short Term Goals - 07/09/20 1023      PT SHORT TERM GOAL #1   Title Pt will be independent with HEP in order to increase ankle mobility and strength and balance in order to perform household ADLs safely.    Baseline 07/09/20 HEP given    Time 4    Period Weeks    Status New             PT Long Term Goals - 08/07/20 0953      PT LONG TERM GOAL #1   Title Pt will increase gait speed to at least 0.77m/s to demonstrate safe, normal community speed    Baseline 07/09/20 0.31; 08/07/20 0.60m/s    Time 8    Period Weeks    Status On-going      PT LONG TERM GOAL #2   Title Pt will demonstrate at least 10 LLE heel raises to demonstrate baseline PF strength in order to particpate in safe community activities and household ADLs    Baseline 07/09/20 R -10 L - unable to test     Time 8    Period Weeks    Status Deferred      PT LONG TERM GOAL #3   Title Pt will increase L SL stance to at least 8secs in order to demonstrate baseline static balance, needed to demonstrate decreased fall risk    Baseline 07/09/20 R 8 sec L - unable to test; 08/07/20 LLE 1sec    Time 8    Period Weeks    Status On-going      PT LONG TERM GOAL #4   Title Pt will demonstrate ind ambulation without AD with normalized gait mechanics to demonstrate return to PLOF and safety with community ambulation.    Baseline 07/09/20 with RW: step to gait with minimal RLE stance,  decreased LLE step length, minimal WB on RLE; 08/07/20 walks with [facility] with decreased L stance R step length    Time 8    Period Weeks    Status On-going      PT LONG TERM GOAL #5   Title Pt will increase L ankle DF A/PROM to at least 20 degrees to demonstrate normalized ankle mobility need for functional squatting and normalized gait mechanics    Baseline 07/09/20 A- 9d P- 11d; 08/07/20 PROM: 3 AROM: 0    Time 8    Period Weeks    Status On-going      Additional Long Term Goals   Additional Long Term Goals Yes      PT LONG TERM GOAL #6   Title Patient will increase FOTO score to 65 to demonstrate predicted increase in functional mobility to complete ADLs    Baseline 07/09/20 49; 08/07/20 65    Time 8    Period Weeks    Status New                 Plan - 08/28/20 1211    Clinical Impression Statement PT continued there progression to address strength and gait. Patient tolerated gait training and demonstrated normalized gait with minimal cueing. Patient has good motivation to use proper form throughout exercises and gait and is motivated to continue progressing. PT will continue progressions to patient tolerance.    Personal Factors and Comorbidities Fitness;Past/Current Experience;Comorbidity 1    Examination-Activity Limitations Bathing;Lift;Locomotion Level;Stand;Stairs;Other    Examination-Participation  Restrictions Cleaning;Shop;Community Activity    Stability/Clinical Decision Making Evolving/Moderate complexity    Clinical Decision Making Moderate    Rehab Potential Good    PT Frequency 2x / week    PT Duration 8 weeks    PT Treatment/Interventions ADLs/Self Care Home Management;Biofeedback;Cryotherapy;Ultrasound;Moist Heat;Iontophoresis 4mg /ml Dexamethasone;Electrical Stimulation;Gait training;Stair training;Functional mobility training;Neuromuscular re-education;Balance training;Therapeutic exercise;Therapeutic activities;Patient/family education;Orthotic Fit/Training;Manual techniques;Dry needling;Passive range of motion;Scar mobilization;Compression bandaging;Manual lymph drainage;Energy conservation;Taping    PT Next Visit Plan gait training, progress exercises    PT Home Exercise Plan SL stance, dorsiflexion stetch, seated plantar flexion    Consulted and Agree with Plan of Care Patient           Patient will benefit from skilled therapeutic intervention in order to improve the following deficits and impairments:  Abnormal gait,Decreased activity tolerance,Decreased balance,Decreased endurance,Decreased coordination,Decreased mobility,Decreased range of motion,Difficulty walking,Decreased strength,Decreased scar mobility,Decreased skin integrity,Hypomobility,Increased edema,Impaired flexibility,Increased fascial restricitons,Impaired sensation,Impaired tone,Impaired UE functional use,Pain,Improper body mechanics,Postural dysfunction  Visit Diagnosis: Pain in left ankle and joints of left foot  Stiffness of left ankle, not elsewhere classified     Problem List Patient Active Problem List   Diagnosis Date Noted  . Postoperative wound infection 06/11/2020  . Surgical site infection 06/11/2020  . Asthma   . GERD (gastroesophageal reflux disease)   . Hypertension   . Fracture of tibia and fibula, shaft, left, open type III, initial encounter 04/10/2020  . Displaced comminuted  fracture of shaft of left fibula, initial encounter for closed fracture 04/10/2020  . Fall from height of greater than 3 feet 04/10/2020  . Open displaced pilon fracture of left tibia 04/07/2020  . Hyperlipidemia   . Mild concentric left ventricular hypertrophy (LVH) 03/09/2019  . Essential hypertension 03/01/2019    Durwin Reges DPT [address], SPT  Durwin Reges 08/28/2020, 12:59 PM  [facility] [address] [place], [address] Phone: [phone]   Fax:  [phone]  Name: Kendra Allison MRN: [id number] Date of Birth: 15-Mar-1956

## 2020-09-02 ENCOUNTER — Ambulatory Visit: Payer: Federal, State, Local not specified - PPO | Admitting: Physical Therapy

## 2020-09-02 ENCOUNTER — Encounter: Payer: Self-pay | Admitting: Physical Therapy

## 2020-09-02 ENCOUNTER — Other Ambulatory Visit: Payer: Self-pay

## 2020-09-02 DIAGNOSIS — M25572 Pain in left ankle and joints of left foot: Secondary | ICD-10-CM

## 2020-09-02 DIAGNOSIS — M25672 Stiffness of left ankle, not elsewhere classified: Secondary | ICD-10-CM

## 2020-09-02 NOTE — Therapy (Signed)
[facility] [address], [place], [address] Phone: [phone]   Fax:  [phone]  Physical Therapy Treatment  Patient Details  Name: Kendra Allison MRN: [id number] Date of Birth: 1956/01/14 No data recorded  Encounter Date: 09/02/2020   PT End of Session - 09/02/20 1113    Visit Number 17    Number of Visits 17    Date for PT Re-Evaluation 09/06/20    Authorization Type BCBS Federal PPO    Authorization Time Period 07/09/20-09/06/20    Authorization - Visit Number 17    Authorization - Number of Visits 17    PT Start Time 1030    PT Stop Time 1113    PT Time Calculation (min) 43 min    Activity Tolerance Patient tolerated treatment well;No increased pain    Behavior During Therapy WFL for tasks assessed/performed           Past Medical History:  Diagnosis Date  . Asthma   . GERD (gastroesophageal reflux disease)   . Hypercholesteremia   . Hyperlipidemia   . Hypertension   . Osteoporosis     Past Surgical History:  Procedure Laterality Date  . ABDOMINAL HYSTERECTOMY    . BREAST BIOPSY    . COLONOSCOPY WITH PROPOFOL N/A 08/10/2019   Procedure: COLONOSCOPY WITH PROPOFOL;  Surgeon: Jonathon Bellows, MD;  Location: [facility] ENDOSCOPY;  Service: Gastroenterology;  Laterality: N/A;  . ESOPHAGOGASTRODUODENOSCOPY (EGD) WITH PROPOFOL N/A 08/10/2019   Procedure: ESOPHAGOGASTRODUODENOSCOPY (EGD) WITH PROPOFOL;  Surgeon: Jonathon Bellows, MD;  Location: [facility] ENDOSCOPY;  Service: Gastroenterology;  Laterality: N/A;  . EXTERNAL FIXATION LEG Left 04/07/2020    EXTERNAL FIXATION LEG (Left Leg Lower)  . EXTERNAL FIXATION LEG Left 04/07/2020   Procedure: EXTERNAL FIXATION LEG;  Surgeon: Shona Needles, MD;  Location: [place];  Service: Orthopedics;  Laterality: Left;  . EXTERNAL FIXATION REMOVAL Left 04/10/2020   Procedure: REMOVAL EXTERNAL FIXATION LEG;  Surgeon: Shona Needles, MD;  Location: [place];  Service: Orthopedics;   Laterality: Left;  . I & D EXTREMITY Left 04/07/2020   Procedure: IRRIGATION AND DEBRIDEMENT EXTREMITY;  Surgeon: Shona Needles, MD;  Location: [place];  Service: Orthopedics;  Laterality: Left;  . I & D EXTREMITY Left 06/12/2020   Procedure: IRRIGATION AND DEBRIDEMENT EXTREMITY;  Surgeon: Shona Needles, MD;  Location: [place];  Service: Orthopedics;  Laterality: Left;  . Nissem  2014  . TIBIA IM NAIL INSERTION Left 04/10/2020   Procedure: INTRAMEDULLARY (IM) NAIL TIBIAL;  Surgeon: Shona Needles, MD;  Location: [place];  Service: Orthopedics;  Laterality: Left;    There were no vitals filed for this visit.   Subjective Assessment - 09/02/20 1027    Subjective Patient walks into PT without cane and reports she has been practicing walking without cane at home and backwards walking. Patient reports she continues to work on single leg calf raises. patient reports 2/10 pain in ankle and has attempted to use ice to decrease pain but it only hurts when she is moving around. Patient reports she has the most difficulty with her SLS balance and would like to continue working on that.    Pertinent History Pt is 65 y.o s/p L initial I&D with external fixation 04/07/20, then transitioned to ORIF with IM nail of left proximal tibia, and distal tibia/fibula 04/10/20. Subsequent irrigation and drainage of wound 06/11/20. Currently in post op shoe, reporting that surgeon advised her to seek PT opinion  on less restrictive shoe option. Is currently unable to wear normal shoes due to swelling. Wearing boot throughout entire day, using RW for ambulation (previously ind without AD), is WBAT per MD notes- though patient reports she cannot tolerate hardly any pressure at this time, and per patient without any other surgical precautions. Is having stitches removed 07/16/20. Uses a bone stimulator multiple times per day (per Xrays delayed healing of distal tibia and fibula fracture). Pain is currently 2/10 worst 4/10. Pain is  aggrevated mainly with pronlonged walking. Pain is eased with ice and rest and motrin prn. Pain in the morning is fine and only gets worst depending on amount of walking done around the house throughout the day. Activities impaired cooking, cleaning, grocery shopping, bathing, lifting. Pt is currently retired. Pt would like to get back to normal walking without any assisstive device. Denies any N/T, B/B changes, weight changes, night sweats/pain.    Limitations Walking;House hold activities    How long can you sit comfortably? unlimited    How long can you stand comfortably? unlimited    How long can you walk comfortably? 1-2 minutes    Patient Stated Goals Get back to normal walking without an assisstive device    Pain Score 2     Pain Location Ankle    Pain Orientation Left           Therex:   Nustep LEsetting=7,Level6 for gentle motion and strengthening; no use of UE to promote weightbearing of LLE33mins   PF/DF on flat side of bosu ball 3 sets of 15, cueing to keep hips in neutral and move at the ankle to increase ROM, minimal UE support required on bar in front   SL calf raise on ball part of bosu ball 3 sets of 10, cueing to    Heel strike and push off exercise:  Standing between two cones on RLE and swinging LLE to heel strike and toe off, good carryover after initial demonstration, standing on foam strip to challenge balance, performed 2 sets of 10 standing on both LE to work on balance of LLE as well   Gait training:   Treadmill 5 minutes: 1.7 speed 3 incline, cueing to increase step length on the R, better demonstration of push off and heel strike on LLE than previous session  amb on straight line to decrease step width 20 feet, repeated three times   amb around clinic 200 feet, cueing to increase gait speed, and step length bilaterally, cueing to increase push off, repeated 300 feet after a short break, better carryover   PT Education - 09/02/20 1115    Education  Details therex form, gait performance    Person(s) Educated Patient    Methods Explanation;Demonstration;Verbal cues;Tactile cues    Comprehension Verbalized understanding;Returned demonstration;Verbal cues required            PT Short Term Goals - 07/09/20 1023      PT SHORT TERM GOAL #1   Title Pt will be independent with HEP in order to increase ankle mobility and strength and balance in order to perform household ADLs safely.    Baseline 07/09/20 HEP given    Time 4    Period Weeks    Status New             PT Long Term Goals - 08/07/20 0953      PT LONG TERM GOAL #1   Title Pt will increase gait speed to at least 0.70m/s to demonstrate safe,  normal community speed    Baseline 07/09/20 0.31; 08/07/20 0.29m/s    Time 8    Period Weeks    Status On-going      PT LONG TERM GOAL #2   Title Pt will demonstrate at least 10 LLE heel raises to demonstrate baseline PF strength in order to particpate in safe community activities and household ADLs    Baseline 07/09/20 R -10 L - unable to test    Time 8    Period Weeks    Status Deferred      PT LONG TERM GOAL #3   Title Pt will increase L SL stance to at least 8secs in order to demonstrate baseline static balance, needed to demonstrate decreased fall risk    Baseline 07/09/20 R 8 sec L - unable to test; 08/07/20 LLE 1sec    Time 8    Period Weeks    Status On-going      PT LONG TERM GOAL #4   Title Pt will demonstrate ind ambulation without AD with normalized gait mechanics to demonstrate return to PLOF and safety with community ambulation.    Baseline 07/09/20 with RW: step to gait with minimal RLE stance, decreased LLE step length, minimal WB on RLE; 08/07/20 walks with [facility] with decreased L stance R step length    Time 8    Period Weeks    Status On-going      PT LONG TERM GOAL #5   Title Pt will increase L ankle DF A/PROM to at least 20 degrees to demonstrate normalized ankle mobility need for functional squatting and  normalized gait mechanics    Baseline 07/09/20 A- 9d P- 11d; 08/07/20 PROM: 3 AROM: 0    Time 8    Period Weeks    Status On-going      Additional Long Term Goals   Additional Long Term Goals Yes      PT LONG TERM GOAL #6   Title Patient will increase FOTO score to 65 to demonstrate predicted increase in functional mobility to complete ADLs    Baseline 07/09/20 49; 08/07/20 65    Time 8    Period Weeks    Status New                 Plan - 09/02/20 1115    Clinical Impression Statement PT continued therex progression focused on normalizing gait and strengthening LLE. Patient tolerated all exercises well and responded to all cues. Patient is eager to return to PLOF and reports she is making good progress toward her goals. PT will continue to increase exercises to patient toelrance and assess progress at next visit.    Personal Factors and Comorbidities Fitness;Past/Current Experience;Comorbidity 1    Examination-Activity Limitations Bathing;Lift;Locomotion Level;Stand;Stairs;Other    Examination-Participation Restrictions Cleaning;Shop;Community Activity    Clinical Decision Making Moderate    Rehab Potential Good    PT Frequency 2x / week    PT Duration 8 weeks    PT Treatment/Interventions ADLs/Self Care Home Management;Biofeedback;Cryotherapy;Ultrasound;Moist Heat;Iontophoresis 4mg /ml Dexamethasone;Electrical Stimulation;Gait training;Stair training;Functional mobility training;Neuromuscular re-education;Balance training;Therapeutic exercise;Therapeutic activities;Patient/family education;Orthotic Fit/Training;Manual techniques;Dry needling;Passive range of motion;Scar mobilization;Compression bandaging;Manual lymph drainage;Energy conservation;Taping    PT Next Visit Plan reassessment    PT Home Exercise Plan SL stance, dorsiflexion stetch, seated plantar flexion    Consulted and Agree with Plan of Care Patient           Patient will benefit from skilled therapeutic  intervention in order to improve the following deficits and  impairments:  Abnormal gait,Decreased activity tolerance,Decreased balance,Decreased endurance,Decreased coordination,Decreased mobility,Decreased range of motion,Difficulty walking,Decreased strength,Decreased scar mobility,Decreased skin integrity,Hypomobility,Increased edema,Impaired flexibility,Increased fascial restricitons,Impaired sensation,Impaired tone,Impaired UE functional use,Pain,Improper body mechanics,Postural dysfunction  Visit Diagnosis: Pain in left ankle and joints of left foot  Stiffness of left ankle, not elsewhere classified     Problem List Patient Active Problem List   Diagnosis Date Noted  . Postoperative wound infection 06/11/2020  . Surgical site infection 06/11/2020  . Asthma   . GERD (gastroesophageal reflux disease)   . Hypertension   . Fracture of tibia and fibula, shaft, left, open type III, initial encounter 04/10/2020  . Displaced comminuted fracture of shaft of left fibula, initial encounter for closed fracture 04/10/2020  . Fall from height of greater than 3 feet 04/10/2020  . Open displaced pilon fracture of left tibia 04/07/2020  . Hyperlipidemia   . Mild concentric left ventricular hypertrophy (LVH) 03/09/2019  . Essential hypertension 03/01/2019     Durwin Reges DPT [address], SPT  Durwin Reges 09/02/2020, 2:07 PM  [facility] [address] [place], [address] Phone: [phone]   Fax:  [phone]  Name: Kendra Allison MRN: [id number] Date of Birth: 02-23-1956

## 2020-09-04 ENCOUNTER — Ambulatory Visit: Payer: Federal, State, Local not specified - PPO | Admitting: Physical Therapy

## 2020-09-04 ENCOUNTER — Other Ambulatory Visit: Payer: Self-pay

## 2020-09-04 ENCOUNTER — Encounter: Payer: Self-pay | Admitting: Physical Therapy

## 2020-09-04 DIAGNOSIS — M25672 Stiffness of left ankle, not elsewhere classified: Secondary | ICD-10-CM | POA: Diagnosis not present

## 2020-09-04 DIAGNOSIS — M25572 Pain in left ankle and joints of left foot: Secondary | ICD-10-CM

## 2020-09-04 NOTE — Therapy (Signed)
[facility] [address], [place], [address] Phone: [phone]   Fax:  [phone]  Physical Therapy Treatment / Discharge Summary Reporting Periods 08/07/20 - 09/04/20    Patient Details  Name: Kendra Allison MRN: [id number] Date of Birth: [birth year] No data recorded  Encounter Date: 09/04/2020   PT End of Session - 09/04/20 1114    Visit Number 18    Number of Visits 17    Date for PT Re-Evaluation 09/06/20    Authorization Type BCBS Federal PPO    Authorization Time Period 07/09/20-09/06/20    Authorization - Visit Number 18    Authorization - Number of Visits 17    PT Start Time 5830    PT Stop Time 1113    PT Time Calculation (min) 38 min    Activity Tolerance Patient tolerated treatment well;No increased pain    Behavior During Therapy WFL for tasks assessed/performed           Past Medical History:  Diagnosis Date  . Asthma   . GERD (gastroesophageal reflux disease)   . Hypercholesteremia   . Hyperlipidemia   . Hypertension   . Osteoporosis     Past Surgical History:  Procedure Laterality Date  . ABDOMINAL HYSTERECTOMY    . BREAST BIOPSY    . COLONOSCOPY WITH PROPOFOL N/A 08/10/2019   Procedure: COLONOSCOPY WITH PROPOFOL;  Surgeon: Jonathon Bellows, MD;  Location: [facility] ENDOSCOPY;  Service: Gastroenterology;  Laterality: N/A;  . ESOPHAGOGASTRODUODENOSCOPY (EGD) WITH PROPOFOL N/A 08/10/2019   Procedure: ESOPHAGOGASTRODUODENOSCOPY (EGD) WITH PROPOFOL;  Surgeon: Jonathon Bellows, MD;  Location: [facility] ENDOSCOPY;  Service: Gastroenterology;  Laterality: N/A;  . EXTERNAL FIXATION LEG Left 04/07/2020    EXTERNAL FIXATION LEG (Left Leg Lower)  . EXTERNAL FIXATION LEG Left 04/07/2020   Procedure: EXTERNAL FIXATION LEG;  Surgeon: Shona Needles, MD;  Location: [place];  Service: Orthopedics;  Laterality: Left;  . EXTERNAL FIXATION REMOVAL Left 04/10/2020   Procedure: REMOVAL EXTERNAL FIXATION LEG;  Surgeon:  Shona Needles, MD;  Location: [place];  Service: Orthopedics;  Laterality: Left;  . I & D EXTREMITY Left 04/07/2020   Procedure: IRRIGATION AND DEBRIDEMENT EXTREMITY;  Surgeon: Shona Needles, MD;  Location: [place];  Service: Orthopedics;  Laterality: Left;  . I & D EXTREMITY Left 06/12/2020   Procedure: IRRIGATION AND DEBRIDEMENT EXTREMITY;  Surgeon: Shona Needles, MD;  Location: [place];  Service: Orthopedics;  Laterality: Left;  . Nissem  2014  . TIBIA IM NAIL INSERTION Left 04/10/2020   Procedure: INTRAMEDULLARY (IM) NAIL TIBIAL;  Surgeon: Shona Needles, MD;  Location: [place];  Service: Orthopedics;  Laterality: Left;    There were no vitals filed for this visit.   Subjective Assessment - 09/04/20 1035    Subjective Patient walks into PT without cane and reports she walks around the grocery store without AD. Patient reports she still has the 2/10 pain on her lateral ankle, but she is going to see the surgeon on tuesday and will ask about this then. Patient reports her HEP is going well with no increase in symptoms. Patient reports she feels prepared to discharge from PT.    Pertinent History Pt is 65 y.o s/p L initial I&D with external fixation 04/07/20, then transitioned to ORIF with IM nail of left proximal tibia, and distal tibia/fibula 04/10/20. Subsequent irrigation and drainage of wound 06/11/20. Currently in post op shoe, reporting that surgeon advised her to seek PT  opinion on less restrictive shoe option. Is currently unable to wear normal shoes due to swelling. Wearing boot throughout entire day, using RW for ambulation (previously ind without AD), is WBAT per MD notes- though patient reports she cannot tolerate hardly any pressure at this time, and per patient without any other surgical precautions. Is having stitches removed 07/16/20. Uses a bone stimulator multiple times per day (per Xrays delayed healing of distal tibia and fibula fracture). Pain is currently 2/10 worst 4/10. Pain is  aggrevated mainly with pronlonged walking. Pain is eased with ice and rest and motrin prn. Pain in the morning is fine and only gets worst depending on amount of walking done around the house throughout the day. Activities impaired cooking, cleaning, grocery shopping, bathing, lifting. Pt is currently retired. Pt would like to get back to normal walking without any assisstive device. Denies any N/T, B/B changes, weight changes, night sweats/pain.    Limitations Walking;House hold activities    How long can you sit comfortably? unlimited    How long can you stand comfortably? unlimited    How long can you walk comfortably? 20-30 minutes    Patient Stated Goals Get back to normal walking without an assisstive device    Currently in Pain? Yes    Pain Score 2     Pain Location Ankle          Nustep warmup, LE setting =7, no UE to increase LE strength, L7 for 5 minutes,  AROM = DF: 16 degrees  PROM = DF: 18 degrees  Gait speed = 1.1 m/s LLE calf raises = 10 reps, slightly decreased height compared to right  SLS: able to hold for 3 seconds on LLE, will provide HEP to continue addressing this but patient feels comfortable working on this independently  Able to amb 150 feet without cane with normalized gait pattern    Patient educated on HEP including :  squat to chair, SLS with counter support, LLE calf raises with counter support, gastroch knee to Bruna Dills stretch- 30 second hold, half kneeling DF stretch 5second hold, 12 reps   Patient educated on the use of a compression stocking and ice instead of brace to control swelling.     PT Education - 09/04/20 1305    Education Details therex form, progress, HEP    Person(s) Educated Patient    Methods Demonstration;Verbal cues;Explanation;Tactile cues    Comprehension Verbal cues required;Verbalized understanding;Returned demonstration            PT Short Term Goals - 09/04/20 1114      PT SHORT TERM GOAL #1   Title Pt will be independent  with HEP in order to increase ankle mobility and strength and balance in order to perform household ADLs safely.    Baseline 07/09/20 HEP given, 09/04/20 - Progressed HEP    Time 4    Period Weeks    Status Achieved             PT Long Term Goals - 09/04/20 1042      PT LONG TERM GOAL #1   Title Pt will increase gait speed to at least 0.16ms to demonstrate safe, normal community speed    Baseline 07/09/20 0.31; 08/07/20 0.669m, 09/04/20 1.1 m/s- no AD    Time 8    Period Weeks    Status Achieved      PT LONG TERM GOAL #2   Title Pt will demonstrate at least 10 LLE heel raises to demonstrate baseline  PF strength in order to particpate in safe community activities and household ADLs    Baseline 07/09/20 R -10 L - unable to test, 09/04/20 R=10 , L=10    Time 8    Period Weeks    Status Achieved      PT LONG TERM GOAL #3   Title Pt will increase L SL stance to at least 8secs in order to demonstrate baseline static balance, needed to demonstrate decreased fall risk    Baseline 07/09/20 R 8 sec L - unable to test; 08/07/20 LLE 1sec, 09/04/20 - L = 3 seconds    Time 8    Period Weeks    Status Not Met      PT LONG TERM GOAL #4   Title Pt will demonstrate ind ambulation without AD with normalized gait mechanics to demonstrate return to PLOF and safety with community ambulation.    Baseline 07/09/20 with RW: step to gait with minimal RLE stance, decreased LLE step length, minimal WB on RLE; 08/07/20 walks with [facility] with decreased L stance R step length, 09/04/20-Able to amb 150 feet without AD with normalized  gait pattern    Time 8    Period Weeks    Status Achieved      PT LONG TERM GOAL #5   Title Pt will increase L ankle DF A/PROM to at least 20 degrees to demonstrate normalized ankle mobility need for functional squatting and normalized gait mechanics    Baseline 07/09/20 A- 9d P- 11d; 08/07/20 PROM: 3 AROM: 0, 09/04/20 A= 16 deg, P= 18 deg    Time 8    Period Weeks    Status Partially Met       PT LONG TERM GOAL #6   Title Patient will increase FOTO score to 65 to demonstrate predicted increase in functional mobility to complete ADLs    Baseline 07/09/20 49; 08/07/20 65, 09/04/20- 85    Time 8    Period Weeks    Status Achieved                 Plan - 09/04/20 1120    Clinical Impression Statement PT reassessed patients progress with goals. Patient demonstrated increae in ROM and strength. Patient was able to meet most of her goals except SLS balance and slightly less ROM than originally expected. However, her ankle ROM is sufficient for functional activities and she has been provided with exercises to address balance, strength, and mobility. Patient educated on updated HEP and the use of compression stocking instead of brace to control swelling. Patient is ready to discharge from PT with plans to continue performing exercises at home independently.    Personal Factors and Comorbidities Fitness;Past/Current Experience;Comorbidity 1    Examination-Activity Limitations Bathing;Lift;Locomotion Level;Stand;Stairs;Other    Examination-Participation Restrictions Cleaning;Shop;Community Activity    Stability/Clinical Decision Making Evolving/Moderate complexity    Clinical Decision Making Moderate    Rehab Potential Good    PT Frequency 2x / week    PT Duration 8 weeks    PT Treatment/Interventions ADLs/Self Care Home Management;Biofeedback;Cryotherapy;Ultrasound;Moist Heat;Iontophoresis 54m/ml Dexamethasone;Electrical Stimulation;Gait training;Stair training;Functional mobility training;Neuromuscular re-education;Balance training;Therapeutic exercise;Therapeutic activities;Patient/family education;Orthotic Fit/Training;Manual techniques;Dry needling;Passive range of motion;Scar mobilization;Compression bandaging;Manual lymph drainage;Energy conservation;Taping    PT Home Exercise Plan SLS, LLE calf raise, squat to chair, gastroch stretch, half kneeling DF stretch    Consulted and  Agree with Plan of Care Patient           Patient will benefit from skilled therapeutic intervention in order  to improve the following deficits and impairments:  Abnormal gait,Decreased activity tolerance,Decreased balance,Decreased endurance,Decreased coordination,Decreased mobility,Decreased range of motion,Difficulty walking,Decreased strength,Decreased scar mobility,Decreased skin integrity,Hypomobility,Increased edema,Impaired flexibility,Increased fascial restricitons,Impaired sensation,Impaired tone,Impaired UE functional use,Pain,Improper body mechanics,Postural dysfunction  Visit Diagnosis: Pain in left ankle and joints of left foot  Stiffness of left ankle, not elsewhere classified     Problem List Patient Active Problem List   Diagnosis Date Noted  . Postoperative wound infection 06/11/2020  . Surgical site infection 06/11/2020  . Asthma   . GERD (gastroesophageal reflux disease)   . Hypertension   . Fracture of tibia and fibula, shaft, left, open type III, initial encounter 04/10/2020  . Displaced comminuted fracture of shaft of left fibula, initial encounter for closed fracture 04/10/2020  . Fall from height of greater than 3 feet 04/10/2020  . Open displaced pilon fracture of left tibia 04/07/2020  . Hyperlipidemia   . Mild concentric left ventricular hypertrophy (LVH) 03/09/2019  . Essential hypertension 03/01/2019     Durwin Reges DPT [address] [place]  Durwin Reges 09/04/2020, 2:46 PM  [facility] [address] [place], [address] Phone: [phone]   Fax:  [phone]  Name: Kendra Allison MRN: [id number] Date of Birth: [birth year]

## 2020-09-17 DIAGNOSIS — S82872D Displaced pilon fracture of left tibia, subsequent encounter for closed fracture with routine healing: Secondary | ICD-10-CM | POA: Diagnosis not present

## 2020-09-17 DIAGNOSIS — S82202F Unspecified fracture of shaft of left tibia, subsequent encounter for open fracture type IIIA, IIIB, or IIIC with routine healing: Secondary | ICD-10-CM | POA: Diagnosis not present

## 2020-09-17 DIAGNOSIS — T8149XD Infection following a procedure, other surgical site, subsequent encounter: Secondary | ICD-10-CM | POA: Diagnosis not present

## 2020-10-10 ENCOUNTER — Other Ambulatory Visit: Payer: Self-pay | Admitting: Family Medicine

## 2020-10-10 DIAGNOSIS — I10 Essential (primary) hypertension: Secondary | ICD-10-CM

## 2021-02-13 ENCOUNTER — Other Ambulatory Visit: Payer: Self-pay

## 2021-02-13 ENCOUNTER — Encounter: Payer: Self-pay | Admitting: Internal Medicine

## 2021-02-13 ENCOUNTER — Other Ambulatory Visit (HOSPITAL_COMMUNITY)
Admission: RE | Admit: 2021-02-13 | Discharge: 2021-02-13 | Disposition: A | Discharge status: Home / Self Care | Payer: Federal, State, Local not specified - PPO | Source: Ambulatory Visit | Attending: Internal Medicine | Admitting: Internal Medicine

## 2021-02-13 ENCOUNTER — Ambulatory Visit: Payer: Federal, State, Local not specified - PPO | Admitting: Internal Medicine

## 2021-02-13 VITALS — BP 141/81 | HR 79 | Temp 97.5°F | Resp 17 | Ht 65.25 in | Wt 175.0 lb

## 2021-02-13 DIAGNOSIS — S93432S Sprain of tibiofibular ligament of left ankle, sequela: Secondary | ICD-10-CM | POA: Diagnosis not present

## 2021-02-13 DIAGNOSIS — R310 Gross hematuria: Secondary | ICD-10-CM | POA: Insufficient documentation

## 2021-02-13 DIAGNOSIS — Z23 Encounter for immunization: Secondary | ICD-10-CM | POA: Diagnosis not present

## 2021-02-13 DIAGNOSIS — N898 Other specified noninflammatory disorders of vagina: Secondary | ICD-10-CM | POA: Diagnosis not present

## 2021-02-13 DIAGNOSIS — S82872S Displaced pilon fracture of left tibia, sequela: Secondary | ICD-10-CM | POA: Diagnosis not present

## 2021-02-13 DIAGNOSIS — R350 Frequency of micturition: Secondary | ICD-10-CM | POA: Insufficient documentation

## 2021-02-13 DIAGNOSIS — R300 Dysuria: Secondary | ICD-10-CM | POA: Diagnosis not present

## 2021-02-13 DIAGNOSIS — S82492S Other fracture of shaft of left fibula, sequela: Secondary | ICD-10-CM | POA: Diagnosis not present

## 2021-02-13 DIAGNOSIS — S82252F Displaced comminuted fracture of shaft of left tibia, subsequent encounter for open fracture type IIIA, IIIB, or IIIC with routine healing: Secondary | ICD-10-CM | POA: Diagnosis not present

## 2021-02-13 LAB — POCT URINALYSIS DIPSTICK
Bilirubin, UA: NEGATIVE
Glucose, UA: NEGATIVE
Ketones, UA: NEGATIVE
Leukocytes, UA: NEGATIVE
Nitrite, UA: NEGATIVE
Protein, UA: NEGATIVE
Spec Grav, UA: 1.02 (ref 1.010–1.025)
Urobilinogen, UA: 0.2 E.U./dL
pH, UA: 5 (ref 5.0–8.0)

## 2021-02-13 MED ORDER — NITROFURANTOIN MONOHYD MACRO 100 MG PO CAPS: 100.0000 mg | ORAL_CAPSULE | Freq: Two times a day (BID) | ORAL | 0 refills | Status: DC

## 2021-02-13 MED ORDER — FLUCONAZOLE 150 MG PO TABS: 150.0000 mg | ORAL_TABLET | Freq: Once | ORAL | 0 refills | Status: AC

## 2021-02-13 NOTE — Patient Instructions (Signed)
Dysuria ?Dysuria is pain or discomfort during urination. The pain or discomfort may be felt in the part of the body that drains urine from the bladder (urethra) or in the surrounding tissue of the genitals. The pain may also be felt in the groin area, lower abdomen, or lower back. ?You may have to urinate frequently or have the sudden feeling that you have to urinate (urgency). Dysuria can affect anyone, but it is more common in females. Dysuria can be caused by many different things, including: ?Urinary tract infection. ?Kidney stones or bladder stones. ?Certain STIs (sexually transmitted infections), such as chlamydia. ?Dehydration. ?Inflammation of the tissues of the vagina. ?Use of certain medicines. ?Use of certain soaps or scented products that cause irritation. ?Follow these instructions at home: ?Medicines ?Take over-the-counter and prescription medicines only as told by your health care provider. ?If you were prescribed an antibiotic medicine, take it as told by your health care provider. Do not stop taking the antibiotic even if you start to feel better. ?Eating and drinking ? ?Drink enough fluid to keep your urine pale yellow. ?Avoid caffeinated beverages, tea, and alcohol. These beverages can irritate the bladder and make dysuria worse. In males, alcohol may irritate the prostate. ?General instructions ?Watch your condition for any changes. ?Urinate often. Avoid holding urine for long periods of time. ?If you are female, you should wipe from front to back after urinating or having a bowel movement. Use each piece of toilet paper only once. ?Empty your bladder after sex. ?Keep all follow-up visits. This is important. ?If you had any tests done to find the cause of dysuria, it is up to you to get your test results. Ask your health care provider, or the department that is doing the test, when your results will be ready. ?Contact a health care provider if: ?You have a fever. ?You develop pain in your back or  sides. ?You have nausea or vomiting. ?You have blood in your urine. ?You are not urinating as often as you usually do. ?Get help right away if: ?Your pain is severe and not relieved with medicines. ?You cannot eat or drink without vomiting. ?You are confused. ?You have a rapid heartbeat while resting. ?You have shaking or chills. ?You feel extremely weak. ?Summary ?Dysuria is pain or discomfort while urinating. Many different conditions can lead to dysuria. ?If you have dysuria, you may have to urinate frequently or have the sudden feeling that you have to urinate (urgency). ?Watch your condition for any changes. Keep all follow-up visits. ?Make sure that you urinate often and drink enough fluid to keep your urine pale yellow. ?This information is not intended to replace advice given to you by your health care provider. Make sure you discuss any questions you have with your health care provider. ?Document Revised: 12/22/2019 Document Reviewed: 12/22/2019 ?Elsevier Patient Education ? 2022 Elsevier Inc. ? ?

## 2021-02-13 NOTE — Progress Notes (Signed)
HPI  Pt presents to the clinic today with c/o urinary frequency, dysuria, blood in her urine, vaginal discharge and vaginal itching. She reports this started 1 week ago. She denies urgency, urine odor, vaginal odor or abnormal vaginal bleeding. She has had some nausea and chills. She denies fever, bladder pressure or low back pain. She has tried cranberry juice with some relief of symptoms. She is not sexually active and has not been in 3 years.   Review of Systems  Past Medical History:  Diagnosis Date   Asthma    GERD (gastroesophageal reflux disease)    Hypercholesteremia    Hyperlipidemia    Hypertension    Osteoporosis     No family history on file.  Social History   Socioeconomic History   Marital status: Single    Spouse name: Not on file   Number of children: Not on file   Years of education: Not on file   Highest education level: Not on file  Occupational History   Not on file  Tobacco Use   Smoking status: Never   Smokeless tobacco: Never   Tobacco comments:    Quit 40years ago.   Vaping Use   Vaping Use: Never used  Substance and Sexual Activity   Alcohol use: Yes    Comment: "rarely"    Drug use: Never   Sexual activity: Not Currently  Other Topics Concern   Not on file  Social History Narrative   ** Merged History Encounter **       Social Determinants of Health   Financial Resource Strain: Not on file  Food Insecurity: Not on file  Transportation Needs: Not on file  Physical Activity: Not on file  Stress: Not on file  Social Connections: Not on file  Intimate Partner Violence: Not on file    No Known Allergies   Constitutional: Pt reports chills. Denies fever, malaise, fatigue, headache or abrupt weight changes.   GU: Pt reports frequency, pain with urination, blood in her urine, vaginal itching and discharge. Denies burning sensation, blood in urine, odor. Skin: Denies redness, rashes, lesions or ulcercations.   No other specific  complaints in a complete review of systems (except as listed in HPI above).    Objective:   Physical Exam  BP (!) 141/81 (BP Location: Right Arm, Patient Position: Sitting, Cuff Size: Normal)   Pulse 79   Temp (!) 97.5 F (36.4 C) (Temporal)   Resp 17   Ht 5' 5.25" (1.657 m)   Wt 175 lb (79.4 kg)   SpO2 100%   BMI 28.90 kg/m   Wt Readings from Last 3 Encounters:  08/21/20 168 lb (76.2 kg)  07/02/20 170 lb 3.2 oz (77.2 kg)  06/11/20 178 lb (80.7 kg)    General: Appears her stated age, overweight, in NAD. Cardiovascular: Normal rate and rhythm. S1,S2 noted.  Murmur noted. Pulmonary/Chest: Normal effort and positive vesicular breath sounds. No respiratory distress. No wheezes, rales or ronchi noted.  Abdomen: Soft. Normal bowel sounds. No distention or masses noted.  Tender to palpation over the bladder area. CVA tenderness noted on the left.        Assessment & Plan:   Frequency, Dysuria, Blood in Urine, Vaginal Discharge, Vaginal Itching:  Urinalysis: moderate blood Will send urine culture Will send urine for BV and yeast eRx sent for Macrobid 100 mg BID x 5 days eRx sent for Diflucan 150 mg PO x 1 OK to take AZO OTC Drink plenty of fluids  Will follow up after labs are back, RTC as needed or if symptoms persist. Webb Silversmith, NP This visit occurred during the SARS-CoV-2 public health emergency.  Safety protocols were in place, including screening questions prior to the visit, additional usage of staff PPE, and extensive cleaning of exam room while observing appropriate contact time as indicated for disinfecting solutions.

## 2021-02-14 ENCOUNTER — Other Ambulatory Visit: Payer: Self-pay | Admitting: Orthopedic Surgery

## 2021-02-14 ENCOUNTER — Other Ambulatory Visit (HOSPITAL_BASED_OUTPATIENT_CLINIC_OR_DEPARTMENT_OTHER): Payer: Self-pay | Admitting: Orthopedic Surgery

## 2021-02-14 DIAGNOSIS — S82872S Displaced pilon fracture of left tibia, sequela: Secondary | ICD-10-CM

## 2021-02-14 LAB — URINE CULTURE
MICRO NUMBER:: 12410094
Result:: NO GROWTH
SPECIMEN QUALITY:: ADEQUATE

## 2021-02-17 LAB — URINE CYTOLOGY ANCILLARY ONLY
Bacterial Vaginitis-Urine: NEGATIVE
Candida Urine: NEGATIVE

## 2021-02-18 ENCOUNTER — Other Ambulatory Visit: Payer: Self-pay | Admitting: Family Medicine

## 2021-02-18 ENCOUNTER — Ambulatory Visit: Payer: Self-pay | Admitting: *Deleted

## 2021-02-18 DIAGNOSIS — B379 Candidiasis, unspecified: Secondary | ICD-10-CM

## 2021-02-18 MED ORDER — FLUCONAZOLE 150 MG PO TABS: ORAL_TABLET | ORAL | 0 refills | Status: DC

## 2021-02-18 NOTE — Telephone Encounter (Signed)
Sent rx Fluconazole  Nobie Putnam, [facility] 02/18/2021, 1:14 PM

## 2021-02-18 NOTE — Telephone Encounter (Signed)
Reason for Disposition  MODERATE-SEVERE itching (i.e., interferes with school, work, or sleep)  Answer Assessment - Initial Assessment Questions 1. SYMPTOM: "What's the main symptom you're concerned about?" (e.g., rash, itching, swelling, dryness)     Vaginal itching  2. LOCATION: "Where is the  itching  located?" (e.g., inside/outside, left/right)     Outside vagina  3. ONSET: "When did the  itching  start?"     After taking macrobid  4. PAIN: "Is there any pain?" If Yes, ask: "How bad is it?" (Scale: 1-10; mild, moderate, severe)   -  MILD (1-3): doesn't interfere with normal activities    -  MODERATE (4-7): interferes with normal activities (e.g., work or school) or awakens from sleep     -  SEVERE (8-10): excruciating pain, unable to do any normal activities     Na  5. CAUSE: "What do you think is causing the symptoms?"     Antibiotic macrobid  6. OTHER SYMPTOMS: "Do you have any other symptoms?" (e.g., fever, vaginal bleeding, pain with urination)     na 7. PREGNANCY: "Is there any chance you are pregnant?" "When was your last menstrual period?"     na  Protocols used: Vulvar Symptoms-A-AH

## 2021-02-18 NOTE — Telephone Encounter (Signed)
Patient is calling about throat still bothering her tongue has turned white. She is requesting more fluconazole  Called patient to review symptoms . Patient reports she would like another prescription for fluconazole due to vaginal itching from taking antibiotic macrobid. Patient asking about results of urine culture . Reviewed with patient message from Avie Echevaria, NP from 02/16/21 at 7:13 am that urine culture did not grow any bacteria. Stop macrobid. Patient reports she only has 1 pill left. Patient requesting another 3 days of fluconazole if possible for symptoms of vaginal itching. Please advise. Care advise given. Patient verbalized understanding of care advise and to call back if needed.

## 2021-02-24 ENCOUNTER — Other Ambulatory Visit: Payer: Self-pay | Admitting: Family Medicine

## 2021-02-24 DIAGNOSIS — J302 Other seasonal allergic rhinitis: Secondary | ICD-10-CM

## 2021-02-26 ENCOUNTER — Ambulatory Visit
Admission: RE | Admit: 2021-02-26 | Discharge: 2021-02-26 | Disposition: A | Discharge status: Home / Self Care | Payer: Federal, State, Local not specified - PPO | Source: Ambulatory Visit | Attending: Orthopedic Surgery | Admitting: Orthopedic Surgery

## 2021-02-26 ENCOUNTER — Other Ambulatory Visit: Payer: Self-pay

## 2021-02-26 DIAGNOSIS — S82872S Displaced pilon fracture of left tibia, sequela: Secondary | ICD-10-CM | POA: Diagnosis not present

## 2021-02-26 DIAGNOSIS — Z981 Arthrodesis status: Secondary | ICD-10-CM | POA: Diagnosis not present

## 2021-02-26 DIAGNOSIS — M810 Age-related osteoporosis without current pathological fracture: Secondary | ICD-10-CM | POA: Diagnosis not present

## 2021-02-26 DIAGNOSIS — S82252A Displaced comminuted fracture of shaft of left tibia, initial encounter for closed fracture: Secondary | ICD-10-CM | POA: Diagnosis not present

## 2021-03-03 DIAGNOSIS — S82252F Displaced comminuted fracture of shaft of left tibia, subsequent encounter for open fracture type IIIA, IIIB, or IIIC with routine healing: Secondary | ICD-10-CM | POA: Diagnosis not present

## 2021-03-03 DIAGNOSIS — S82872S Displaced pilon fracture of left tibia, sequela: Secondary | ICD-10-CM | POA: Diagnosis not present

## 2021-03-03 DIAGNOSIS — S93432S Sprain of tibiofibular ligament of left ankle, sequela: Secondary | ICD-10-CM | POA: Diagnosis not present

## 2021-03-03 DIAGNOSIS — S92492S Other fracture of left great toe, sequela: Secondary | ICD-10-CM | POA: Diagnosis not present

## 2021-03-07 DIAGNOSIS — S82201D Unspecified fracture of shaft of right tibia, subsequent encounter for closed fracture with routine healing: Secondary | ICD-10-CM | POA: Diagnosis not present

## 2021-03-14 DIAGNOSIS — S82201D Unspecified fracture of shaft of right tibia, subsequent encounter for closed fracture with routine healing: Secondary | ICD-10-CM | POA: Diagnosis not present

## 2021-03-18 DIAGNOSIS — S82201D Unspecified fracture of shaft of right tibia, subsequent encounter for closed fracture with routine healing: Secondary | ICD-10-CM | POA: Diagnosis not present

## 2021-03-21 DIAGNOSIS — Z6829 Body mass index (BMI) 29.0-29.9, adult: Secondary | ICD-10-CM | POA: Diagnosis not present

## 2021-03-21 DIAGNOSIS — N898 Other specified noninflammatory disorders of vagina: Secondary | ICD-10-CM | POA: Diagnosis not present

## 2021-03-21 DIAGNOSIS — M810 Age-related osteoporosis without current pathological fracture: Secondary | ICD-10-CM | POA: Diagnosis not present

## 2021-03-21 DIAGNOSIS — L292 Pruritus vulvae: Secondary | ICD-10-CM | POA: Diagnosis not present

## 2021-03-21 DIAGNOSIS — Z01419 Encounter for gynecological examination (general) (routine) without abnormal findings: Secondary | ICD-10-CM | POA: Diagnosis not present

## 2021-03-24 ENCOUNTER — Encounter: Payer: Self-pay | Admitting: Internal Medicine

## 2021-03-24 ENCOUNTER — Ambulatory Visit: Payer: Self-pay | Admitting: *Deleted

## 2021-03-24 ENCOUNTER — Other Ambulatory Visit: Payer: Self-pay

## 2021-03-24 ENCOUNTER — Ambulatory Visit: Payer: Federal, State, Local not specified - PPO | Admitting: Internal Medicine

## 2021-03-24 VITALS — BP 152/89 | HR 84 | Temp 98.2°F | Resp 17 | Ht 65.25 in | Wt 175.2 lb

## 2021-03-24 DIAGNOSIS — R310 Gross hematuria: Secondary | ICD-10-CM

## 2021-03-24 DIAGNOSIS — R197 Diarrhea, unspecified: Secondary | ICD-10-CM

## 2021-03-24 DIAGNOSIS — R1013 Epigastric pain: Secondary | ICD-10-CM

## 2021-03-24 DIAGNOSIS — R140 Abdominal distension (gaseous): Secondary | ICD-10-CM

## 2021-03-24 DIAGNOSIS — R350 Frequency of micturition: Secondary | ICD-10-CM

## 2021-03-24 DIAGNOSIS — R110 Nausea: Secondary | ICD-10-CM

## 2021-03-24 LAB — POCT URINALYSIS DIPSTICK
Bilirubin, UA: NEGATIVE
Blood, UA: NEGATIVE
Glucose, UA: NEGATIVE
Ketones, UA: NEGATIVE
Leukocytes, UA: NEGATIVE
Nitrite, UA: NEGATIVE
Protein, UA: NEGATIVE
Spec Grav, UA: <=1.005 — AB (ref 1.010–1.025)
Urobilinogen, UA: 0.2 E.U./dL
pH, UA: 5 (ref 5.0–8.0)

## 2021-03-24 NOTE — Patient Instructions (Signed)
Abdominal Pain, Adult Many things can cause belly (abdominal) pain. Most times, belly pain is not dangerous. Many cases of belly pain can be watched and treated at home. Sometimes, though, belly pain is serious. Yourdoctor will try to find the cause of your belly pain. Follow these instructions at home:  Medicines Take over-the-counter and prescription medicines only as told by your doctor. Do not take medicines that help you poop (laxatives) unless told by your doctor. General instructions Watch your belly pain for any changes. Drink enough fluid to keep your pee (urine) pale yellow. Keep all follow-up visits as told by your doctor. This is important. Contact a doctor if: Your belly pain changes or gets worse. You are not hungry, or you lose weight without trying. You are having trouble pooping (constipated) or have watery poop (diarrhea) for more than 2-3 days. You have pain when you pee or poop. Your belly pain wakes you up at night. Your pain gets worse with meals, after eating, or with certain foods. You are vomiting and cannot keep anything down. You have a fever. You have blood in your pee. Get help right away if: Your pain does not go away as soon as your doctor says it should. You cannot stop vomiting. Your pain is only in areas of your belly, such as the right side or the left lower part of the belly. You have bloody or black poop, or poop that looks like tar. You have very bad pain, cramping, or bloating in your belly. You have signs of not having enough fluid or water in your body (dehydration), such as: Dark pee, very little pee, or no pee. Cracked lips. Dry mouth. Sunken eyes. Sleepiness. Weakness. You have trouble breathing or chest pain. Summary Many cases of belly pain can be watched and treated at home. Watch your belly pain for any changes. Take over-the-counter and prescription medicines only as told by your doctor. Contact a doctor if your belly pain  changes or gets worse. Get help right away if you have very bad pain, cramping, or bloating in your belly. This information is not intended to replace advice given to you by your health care provider. Make sure you discuss any questions you have with your healthcare provider. Document Revised: 09/19/2018 Document Reviewed: 09/19/2018 Elsevier Patient Education  2022 Elsevier Inc.  

## 2021-03-24 NOTE — Telephone Encounter (Signed)
Reason for Disposition  Eyelid is red and painful (or tender to touch)  Answer Assessment - Initial Assessment Questions 1. COLOR of URINE: "Describe the color of the urine."  (e.g., tea-colored, pink, red, blood clots, bloody)     Pt calling in saying she has blood in her urine.   Also her vision is blurry.   I think it's my allergies affecting my eyes.  I thought it was allergies but I'm not sure.    My vision is blurry.   The allergy medicine usually helps.   My eyes are puffy now.    I think it's related to the urine.   I was seen recently for blood in my urine and it was sent off and came back ok.   I would like to have some blood work done.   2. ONSET: "When did the bleeding start?"      I noticed it Friday.   It's usually dark yellow but it's had red in it now.   I'm urinating more frequently.   No burning.  A little lower back pain on left side that started on Fri also.   A little pain over bladder area. 3. EPISODES: "How many times has there been blood in the urine?" or "How many times today?"     Every time I urinate.   Today just before calling you. 4. PAIN with URINATION: "Is there any pain with passing your urine?" If Yes, ask: "How bad is the pain?"  (Scale 1-10; or mild, moderate, severe)    - MILD - complains slightly about urination hurting    - MODERATE - interferes with normal activities      - SEVERE - excruciating, unwilling or unable to urinate because of the pain      No burning 5. FEVER: "Do you have a fever?" If Yes, ask: "What is your temperature, how was it measured, and when did it start?"     I keep sweating 6. ASSOCIATED SYMPTOMS: "Are you passing urine more frequently than usual?"     Yes 7. OTHER SYMPTOMS: "Do you have any other symptoms?" (e.g., back/flank pain, abdominal pain, vomiting)     A little left flank pain.  A little pain over the bladder area.   I'm having diarrhea.   Everything I eat is going through me.   It started before Friday before I noticed the  frequency with urination. 8. PREGNANCY: "Is there any chance you are pregnant?" "When was your last menstrual period?"     Not asked due to age  Answer Assessment - Initial Assessment Questions 1. ONSET: "When did the swelling start?" (e.g., minutes, hours, days)     My eyes are puffy like I've not had any sleep.   Under my eyes are puffy since last week when the frequent urination started. 2. LOCATION: "What part of the eyelids is swollen?"     Under my eyes are puffy and red. 3. SEVERITY: "How swollen is it?"     Both eyes puffy under my eyes like I haven't had sleep. 4. ITCHING: "Is there any itching?" If Yes, ask: "How much?"   (Scale 1-10; mild, moderate or severe)     Not now.   They were but I used the eye drops which helped the itching.  No drainage 5. PAIN: "Is the swelling painful to touch?" If Yes, ask: "How painful is it?"   (Scale 1-10; mild, moderate or severe)     Hurts to touch the  puffiness under my eyes 6. FEVER: "Do you have a fever?" If Yes, ask: "What is it, how was it measured, and when did it start?"      No 7. CAUSE: "What do you think is causing the swelling?"     I thought it was allergies but now I believe it's related to the urine problem. 8. RECURRENT SYMPTOM: "Have you had eyelid swelling before?" If Yes, ask: "When was the last time?" "What happened that time?"     No 9. OTHER SYMPTOMS: "Do you have any other symptoms?" (e.g., blurred vision, eye discharge, rash, runny nose)     Blurred vision.   Stuffy nose not running.  Ears are fine.  No coughing or sore throat. 10. PREGNANCY: "Is there any chance you are pregnant?" "When was your last menstrual period?"       Not asked due to age  Protocols used: Urine - Blood In-A-AH, Eye - Swelling-A-AH

## 2021-03-24 NOTE — Progress Notes (Signed)
Subjective:    Patient ID: Kendra Allison, female    DOB: 11/30/1955, 65 y.o.   MRN: [id number]  HPI  Pt presents to the clinic today with c/o urinary frequency, blood in her urine. She noticed this 4 days ago. She reports it seems to come and go. She reports associated abdominal pain, nausea, bloating, diarrhea and sweating. She denies urinary urgency, dysuria, vaginal irritation, odor, discharge or abnormal bleeding. She denies reflux, constipation, vomiting or blood in her stool. She has not taken anything OTC for this. Prior to this past week, she had been taking frequent Ibuprofen after a root canal but reports she has not had any Ibuprofen in a week. She did recently start Melatonin and is not sure if this is a contributing factor. She has not had sick contacts that she is aware of.   Review of Systems     Past Medical History:  Diagnosis Date   Asthma    GERD (gastroesophageal reflux disease)    Hypercholesteremia    Hyperlipidemia    Hypertension    Osteoporosis     Current Outpatient Medications  Medication Sig Dispense Refill   acetaminophen (TYLENOL) 500 MG tablet Take 1,000 mg by mouth every 6 (six) hours as needed for mild pain.     albuterol (VENTOLIN HFA) 108 (90 Base) MCG/ACT inhaler Inhale 2 puffs into the lungs every 4 (four) hours as needed for wheezing or shortness of breath (cough). 1 each 3   alendronate (FOSAMAX) 70 MG tablet TAKE 1 TABLET(70 MG) BY MOUTH 1 TIME A WEEK WITH A FULL GLASS OF WATER AND ON AN EMPTY STOMACH 12 tablet 3   amLODipine (NORVASC) 10 MG tablet Take 1 tablet (10 mg total) by mouth daily. 90 tablet 3   aspirin 325 MG tablet Take 1 tablet (325 mg total) by mouth daily. (Patient not taking: Reported on 02/13/2021) 30 tablet 0   azelastine (ASTELIN) 0.1 % nasal spray Place 1-2 sprays into both nostrils 2 (two) times daily as needed for allergies.      celecoxib (CELEBREX) 200 MG capsule Take 200 mg by mouth 2 (two) times daily.     cetirizine  (ZYRTEC) 10 MG tablet Take 10 mg by mouth daily as needed for allergies.     EPINEPHrine 0.3 mg/0.3 mL IJ SOAJ injection Inject 0.3 mg into the muscle as needed for anaphylaxis (for allergy shots). (Patient not taking: Reported on 02/13/2021)     ferrous sulfate 325 (65 FE) MG EC tablet Take 1 tablet (325 mg total) by mouth 3 (three) times daily with meals. (Patient not taking: Reported on 07/02/2020) 90 tablet 2   fluconazole (DIFLUCAN) 150 MG tablet Take one tablet by mouth every other day until completed 3 doses. 3 tablet 0   fluticasone (FLONASE) 50 MCG/ACT nasal spray Place 2 sprays into both nostrils daily as needed for allergies.     losartan (COZAAR) 50 MG tablet Take 1 tablet (50 mg total) by mouth daily. 90 tablet 3   montelukast (SINGULAIR) 10 MG tablet TAKE 1 TABLET(10 MG) BY MOUTH AT BEDTIME 90 tablet 1   Multiple Vitamin (MULTIVITAMIN WITH MINERALS) TABS tablet Take 1 tablet by mouth daily. One-A-Day Multivitamin     nitrofurantoin, macrocrystal-monohydrate, (MACROBID) 100 MG capsule Take 1 capsule (100 mg total) by mouth 2 (two) times daily. 10 capsule 0   rosuvastatin (CRESTOR) 10 MG tablet Take 1 tablet (10 mg total) by mouth at bedtime. 90 tablet 3   No current facility-administered medications  for this visit.    No Known Allergies  No family history on file.  Social History   Socioeconomic History   Marital status: Single    Spouse name: Not on file   Number of children: Not on file   Years of education: Not on file   Highest education level: Not on file  Occupational History   Not on file  Tobacco Use   Smoking status: Never   Smokeless tobacco: Never   Tobacco comments:    Quit 40years ago.   Vaping Use   Vaping Use: Never used  Substance and Sexual Activity   Alcohol use: Yes    Comment: "rarely"    Drug use: Never   Sexual activity: Not Currently  Other Topics Concern   Not on file  Social History Narrative   ** Merged History Encounter **        Social Determinants of Health   Financial Resource Strain: Not on file  Food Insecurity: Not on file  Transportation Needs: Not on file  Physical Activity: Not on file  Stress: Not on file  Social Connections: Not on file  Intimate Partner Violence: Not on file     Constitutional: Denies fever, malaise, fatigue, headache or abrupt weight changes.  Respiratory: Denies difficulty breathing, shortness of breath, cough or sputum production.   Cardiovascular: Denies chest pain, chest tightness, palpitations or swelling in the hands or feet.  Gastrointestinal: Pt reports abdominal pain, bloating and diarrhea. Denies abdominal pain, bloating, constipation, diarrhea or blood in the stool.  GU: Pt reports urinary frequency, blood in her urine. Denies urgency, pain with urination, burning sensation, odor or discharge.  No other specific complaints in a complete review of systems (except as listed in HPI above).  Objective:   Physical Exam  BP (!) 152/89 (BP Location: Right Arm, Patient Position: Sitting, Cuff Size: Normal)   Pulse 84   Temp 98.2 F (36.8 C) (Temporal)   Resp 17   Ht 5' 5.25" (1.657 m)   Wt 175 lb 3.2 oz (79.5 kg)   SpO2 100%   BMI 28.93 kg/m   Wt Readings from Last 3 Encounters:  02/13/21 175 lb (79.4 kg)  08/21/20 168 lb (76.2 kg)  07/02/20 170 lb 3.2 oz (77.2 kg)    General: Appears her stated age, overweight, in NAD. Skin: Warm, dry and intact.  Cardiovascular: Normal rate and rhythm. S1,S2 noted.  No murmur, rubs or gallops noted. Pulmonary/Chest: Normal effort and positive vesicular breath sounds. No respiratory distress. No wheezes, rales or ronchi noted.  Abdomen: Soft and mildly tender in the epigastric region. Normal bowel sounds. No distention or masses noted.  Neurological: Alert and oriented.   BMET    Component Value Date/Time   NA 141 08/21/2020 0859   NA 139 10/13/2018 0000   K 4.3 08/21/2020 0859   CL 103 08/21/2020 0859   CO2 29  08/21/2020 0859   GLUCOSE 87 08/21/2020 0859   BUN 14 08/21/2020 0859   BUN 17 10/13/2018 0000   CREATININE 0.86 08/21/2020 0859   CALCIUM 10.6 (H) 08/21/2020 0859   GFRNONAA 71 08/21/2020 0859   GFRAA 83 08/21/2020 0859    Lipid Panel     Component Value Date/Time   CHOL 141 08/21/2020 0859   TRIG 98 08/21/2020 0859   HDL 52 08/21/2020 0859   CHOLHDL 2.7 08/21/2020 0859   LDLCALC 71 08/21/2020 0859    CBC    Component Value Date/Time   WBC 4.7  08/21/2020 0859   RBC 4.35 08/21/2020 0859   HGB 13.0 08/21/2020 0859   HCT 39.3 08/21/2020 0859   PLT 403 (H) 08/21/2020 0859   MCV 90.3 08/21/2020 0859   MCH 29.9 08/21/2020 0859   MCHC 33.1 08/21/2020 0859   RDW 12.8 08/21/2020 0859   LYMPHSABS 1,349 08/21/2020 0859   MONOABS 0.4 06/11/2020 1534   EOSABS 89 08/21/2020 0859   BASOSABS 61 08/21/2020 0859    Hgb A1C Lab Results  Component Value Date   HGBA1C 5.3 08/21/2020           Assessment & Plan:   Epigastric Pain, Abdominal, Bloating, Nausea, Diarrhea, Blood in Urine, Urinary Frequency:  Urinalysis: normal Will check CMET, Amylase, Lipase and H Pylori Advised her to avoid NSAID's OTC in case this is causing some inflammation in her stomach Encouraged bland diet for now Can take TUMS, Mylanta, Maalox or Rolaids OTC to see if this helps  Will follow up after labs with further recommendations and treatment plan.  Webb Silversmith, NP This visit occurred during the SARS-CoV-2 public health emergency.  Safety protocols were in place, including screening questions prior to the visit, additional usage of staff PPE, and extensive cleaning of exam room while observing appropriate contact time as indicated for disinfecting solutions.

## 2021-03-24 NOTE — Telephone Encounter (Signed)
Pt called in c/o having blood in her urine that started on Friday.  She is also c/o having puffiness that is tender under both of her eyes like she hasn't had enough sleep.  She is requesting blood work and thinks the puffiness is related to the urinary problems.    I made her an appt with Webb Silversmith for today at 3:40 in office visit.     I sent my notes to [facility].

## 2021-03-25 DIAGNOSIS — S82201D Unspecified fracture of shaft of right tibia, subsequent encounter for closed fracture with routine healing: Secondary | ICD-10-CM | POA: Diagnosis not present

## 2021-03-25 LAB — COMPLETE METABOLIC PANEL WITH GFR
AG Ratio: 2.1 (calc) (ref 1.0–2.5)
ALT: 18 U/L (ref 6–29)
AST: 20 U/L (ref 10–35)
Albumin: 5.1 g/dL (ref 3.6–5.1)
Alkaline phosphatase (APISO): 74 U/L (ref 37–153)
BUN: 16 mg/dL (ref 7–25)
CO2: 27 mmol/L (ref 20–32)
Calcium: 10.4 mg/dL (ref 8.6–10.4)
Chloride: 105 mmol/L (ref 98–110)
Creat: 1 mg/dL (ref 0.50–1.05)
Globulin: 2.4 g/dL (calc) (ref 1.9–3.7)
Glucose, Bld: 101 mg/dL — ABNORMAL HIGH (ref 65–99)
Potassium: 4 mmol/L (ref 3.5–5.3)
Sodium: 141 mmol/L (ref 135–146)
Total Bilirubin: 0.4 mg/dL (ref 0.2–1.2)
Total Protein: 7.5 g/dL (ref 6.1–8.1)
eGFR: 63 mL/min/{1.73_m2} (ref >=60)

## 2021-03-25 LAB — LIPASE: Lipase: 24 U/L (ref 7–60)

## 2021-03-25 LAB — URINALYSIS, MICROSCOPIC ONLY
Bacteria, UA: NONE SEEN /HPF
Hyaline Cast: NONE SEEN /LPF
RBC / HPF: NONE SEEN /HPF (ref 0–2)
Squamous Epithelial / HPF: NONE SEEN /HPF (ref <=5)
WBC, UA: NONE SEEN /HPF (ref 0–5)

## 2021-03-25 LAB — AMYLASE: Amylase: 77 U/L (ref 21–101)

## 2021-03-25 LAB — H. PYLORI BREATH TEST: H. pylori Breath Test: NOT DETECTED

## 2021-03-28 ENCOUNTER — Other Ambulatory Visit: Payer: Self-pay | Admitting: Obstetrics & Gynecology

## 2021-03-28 DIAGNOSIS — M810 Age-related osteoporosis without current pathological fracture: Secondary | ICD-10-CM

## 2021-04-02 DIAGNOSIS — S82201D Unspecified fracture of shaft of right tibia, subsequent encounter for closed fracture with routine healing: Secondary | ICD-10-CM | POA: Diagnosis not present

## 2021-04-09 DIAGNOSIS — S82201D Unspecified fracture of shaft of right tibia, subsequent encounter for closed fracture with routine healing: Secondary | ICD-10-CM | POA: Diagnosis not present

## 2021-04-15 DIAGNOSIS — S82201D Unspecified fracture of shaft of right tibia, subsequent encounter for closed fracture with routine healing: Secondary | ICD-10-CM | POA: Diagnosis not present

## 2021-04-24 ENCOUNTER — Other Ambulatory Visit: Payer: Self-pay

## 2021-04-24 ENCOUNTER — Ambulatory Visit
Admission: RE | Admit: 2021-04-24 | Discharge: 2021-04-24 | Disposition: A | Discharge status: Home / Self Care | Payer: Federal, State, Local not specified - PPO | Source: Ambulatory Visit | Attending: Obstetrics & Gynecology | Admitting: Obstetrics & Gynecology

## 2021-04-24 DIAGNOSIS — M810 Age-related osteoporosis without current pathological fracture: Secondary | ICD-10-CM

## 2021-04-24 DIAGNOSIS — Z780 Asymptomatic menopausal state: Secondary | ICD-10-CM | POA: Diagnosis not present

## 2021-04-24 DIAGNOSIS — M8589 Other specified disorders of bone density and structure, multiple sites: Secondary | ICD-10-CM | POA: Diagnosis not present

## 2021-04-28 ENCOUNTER — Other Ambulatory Visit: Payer: Self-pay

## 2021-04-28 ENCOUNTER — Encounter: Payer: Self-pay | Admitting: Internal Medicine

## 2021-04-28 ENCOUNTER — Ambulatory Visit: Payer: Federal, State, Local not specified - PPO | Admitting: Internal Medicine

## 2021-04-28 VITALS — BP 155/93 | HR 80 | Temp 97.1°F | Resp 17 | Ht 65.35 in | Wt 173.8 lb

## 2021-04-28 DIAGNOSIS — R5383 Other fatigue: Secondary | ICD-10-CM | POA: Diagnosis not present

## 2021-04-28 DIAGNOSIS — R531 Weakness: Secondary | ICD-10-CM

## 2021-04-28 DIAGNOSIS — H538 Other visual disturbances: Secondary | ICD-10-CM | POA: Diagnosis not present

## 2021-04-28 DIAGNOSIS — R350 Frequency of micturition: Secondary | ICD-10-CM

## 2021-04-28 DIAGNOSIS — I10 Essential (primary) hypertension: Secondary | ICD-10-CM

## 2021-04-28 DIAGNOSIS — R202 Paresthesia of skin: Secondary | ICD-10-CM

## 2021-04-28 DIAGNOSIS — R631 Polydipsia: Secondary | ICD-10-CM

## 2021-04-28 LAB — CBC
HCT: 39 % (ref 35.0–45.0)
Hemoglobin: 12.9 g/dL (ref 11.7–15.5)
MCH: 29.9 pg (ref 27.0–33.0)
MCHC: 33.1 g/dL (ref 32.0–36.0)
MCV: 90.5 fL (ref 80.0–100.0)
MPV: 10 fL (ref 7.5–12.5)
Platelets: 344 10*3/uL (ref 140–400)
RBC: 4.31 10*6/uL (ref 3.80–5.10)
RDW: 12.1 % (ref 11.0–15.0)
WBC: 4.1 10*3/uL (ref 3.8–10.8)

## 2021-04-28 LAB — TSH: TSH: 1.54 mIU/L (ref 0.40–4.50)

## 2021-04-28 LAB — VITAMIN D 25 HYDROXY (VIT D DEFICIENCY, FRACTURES): Vit D, 25-Hydroxy: 59 ng/mL (ref 30–100)

## 2021-04-28 LAB — MAGNESIUM: Magnesium: 2.3 mg/dL (ref 1.5–2.5)

## 2021-04-28 LAB — POCT GLYCOSYLATED HEMOGLOBIN (HGB A1C): Hemoglobin A1C: 5.5 % (ref 4.0–5.6)

## 2021-04-28 LAB — VITAMIN B12: Vitamin B-12: 928 pg/mL (ref 200–1100)

## 2021-04-28 NOTE — Patient Instructions (Signed)
Blurred Vision, Adult   Having blurred vision means that you cannot see things clearly. Your vision may seem fuzzy or out of focus. It can involve your vision for objects that are close or far away. It may affect one or both eyes. There are many causes of blurred vision, including cataracts, macular degeneration, eye inflammation (uveitis), and diabetic retinopathy. In many cases, blurred vision has to do with the shape of your eye. An abnormal eye shape means you cannot focus well (refractive error). When this happens, it can cause: Faraway objects to look blurry (nearsightedness). Close objects to look blurry (farsightedness). Blurry vision at any distance (astigmatism). Refractive errors are often corrected with glasses or contacts. If you have blurred vision, it is best to see an eye care specialist. Blurred vision can be diagnosed based on your symptoms and a complete eye exam. Tell your eye care specialist about any other health problems you have, any recent eye injury, and any prior surgeries. You may need to see an eye care specialist who specializes in medical and surgical eye problems (ophthalmologist). Your treatment will depend on what is causing your blurred vision. Follow these instructions at home: Keep all follow-up visits. This is important. These include any visits to your eye specialists. Do not drive or use machinery if your vision is blurry. Use eye drops only as told by your health care provider. If you were prescribed glasses or contact lenses, wear the glasses or contacts as told by your health care provider. Schedule eye exams regularly. Pay attention to any changes in your symptoms. Avoid rubbing your eyes. Contact a health care provider if: Your symptoms do not improve or they get worse. You have: New symptoms. A headache. Trouble seeing at night. Trouble noticing the difference between colors. You notice: Drooping of your eyelids. Drainage coming from your  eyes. A rash around your eyes. Get help right away if: You have: Severe eye pain. A severe headache. A sudden change in vision. A sudden loss of vision. A vision change after an injury. You notice flashing lights in your field of vision. Your field of vision is the area that you can see without moving your eyes. Summary Having blurred vision means that you cannot see things clearly. Your vision may seem fuzzy or out of focus. There are many causes of blurred vision. In many cases, blurred vision has to do with an abnormal eye shape (refractive error), and it can be corrected with glasses or contact lenses. Pay attention to any changes in your symptoms. Contact a health care provider if your symptoms do not improve or if you have any new symptoms. This information is not intended to replace advice given to you by your health care provider. Make sure you discuss any questions you have with your health care provider. Document Revised: 09/10/2020 Document Reviewed: 09/10/2020 Elsevier Patient Education  [place].

## 2021-04-28 NOTE — Progress Notes (Signed)
Subjective:    Patient ID: Kendra Allison, female    DOB: 1955-09-26, 65 y.o.   MRN: [id number]  HPI  Patient presents the clinic today with complaint of fatigue, blurry vision, increased thirst, frequent urination and numbness and tingling in her feet.  She reports this has been an ongoing issue for at least the last 6 months.  She reports her eyes are blurry first thing in the morning and takes about an minute or so to focus and then improves.  She denies floaters, double vision or auras.  She denies urinary urgency, dysuria or blood in her urine.  She denies chronic low back pain.  She is very concerned that she has diabetes.  She has seen her eye doctor for her the blurred vision and he reports that it is not an eye problem.  Of note, her BP today is 155/93.  She is taking amlodipine and losartan as prescribed but is very concerned that these medications are causing her symptoms and would like them to be changed.  She would also like to be checked for diabetes as well.  Review of Systems     Past Medical History:  Diagnosis Date   Asthma    GERD (gastroesophageal reflux disease)    Hypercholesteremia    Hyperlipidemia    Hypertension    Osteoporosis     Current Outpatient Medications  Medication Sig Dispense Refill   acetaminophen (TYLENOL) 500 MG tablet Take 1,000 mg by mouth every 6 (six) hours as needed for mild pain.     albuterol (VENTOLIN HFA) 108 (90 Base) MCG/ACT inhaler Inhale 2 puffs into the lungs every 4 (four) hours as needed for wheezing or shortness of breath (cough). 1 each 3   alendronate (FOSAMAX) 70 MG tablet TAKE 1 TABLET(70 MG) BY MOUTH 1 TIME A WEEK WITH A FULL GLASS OF WATER AND ON AN EMPTY STOMACH 12 tablet 3   amLODipine (NORVASC) 10 MG tablet Take 1 tablet (10 mg total) by mouth daily. 90 tablet 3   azelastine (ASTELIN) 0.1 % nasal spray Place 1-2 sprays into both nostrils 2 (two) times daily as needed for allergies.      celecoxib (CELEBREX) 200 MG  capsule Take 200 mg by mouth 2 (two) times daily as needed.     cetirizine (ZYRTEC) 10 MG tablet Take 10 mg by mouth daily as needed for allergies.     EPINEPHrine 0.3 mg/0.3 mL IJ SOAJ injection Inject 0.3 mg into the muscle as needed for anaphylaxis (for allergy shots). (Patient not taking: No sig reported)     ferrous sulfate 325 (65 FE) MG EC tablet Take 1 tablet (325 mg total) by mouth 3 (three) times daily with meals. (Patient not taking: Reported on 07/02/2020) 90 tablet 2   fluticasone (FLONASE) 50 MCG/ACT nasal spray Place 2 sprays into both nostrils daily as needed for allergies.     losartan (COZAAR) 50 MG tablet Take 1 tablet (50 mg total) by mouth daily. 90 tablet 3   montelukast (SINGULAIR) 10 MG tablet TAKE 1 TABLET(10 MG) BY MOUTH AT BEDTIME 90 tablet 1   Multiple Vitamin (MULTIVITAMIN WITH MINERALS) TABS tablet Take 1 tablet by mouth daily. One-A-Day Multivitamin     nitrofurantoin, macrocrystal-monohydrate, (MACROBID) 100 MG capsule Take 1 capsule (100 mg total) by mouth 2 (two) times daily. (Patient not taking: Reported on 03/24/2021) 10 capsule 0   rosuvastatin (CRESTOR) 10 MG tablet Take 1 tablet (10 mg total) by mouth at bedtime. 90 tablet  3   No current facility-administered medications for this visit.    No Known Allergies  No family history on file.  Social History   Socioeconomic History   Marital status: Single    Spouse name: Not on file   Number of children: Not on file   Years of education: Not on file   Highest education level: Not on file  Occupational History   Not on file  Tobacco Use   Smoking status: Never   Smokeless tobacco: Never   Tobacco comments:    Quit 40years ago.   Vaping Use   Vaping Use: Never used  Substance and Sexual Activity   Alcohol use: Yes    Comment: "rarely"    Drug use: Never   Sexual activity: Not Currently  Other Topics Concern   Not on file  Social History Narrative   ** Merged History Encounter **       Social  Determinants of Health   Financial Resource Strain: Not on file  Food Insecurity: Not on file  Transportation Needs: Not on file  Physical Activity: Not on file  Stress: Not on file  Social Connections: Not on file  Intimate Partner Violence: Not on file     Constitutional: Patient reports fatigue.  Denies fever, malaise, headache or abrupt weight changes.  HEENT: Patient reports blurred vision.  Denies eye pain, eye redness, ear pain, ringing in the ears, wax buildup, runny nose, nasal congestion, bloody nose, or sore throat. Respiratory: Denies difficulty breathing, shortness of breath, cough or sputum production.   Cardiovascular: Denies chest pain, chest tightness, palpitations or swelling in the hands or feet.  Gastrointestinal: Patient reports increased thirst.  Denies abdominal pain, bloating, constipation, diarrhea or blood in the stool.  GU: Patient reports urinary frequency.  Denies urgency, pain with urination, burning sensation, blood in urine, odor or discharge. Musculoskeletal: Denies decrease in range of motion, difficulty with gait, muscle pain or joint pain and swelling.  Skin: Denies redness, rashes, lesions or ulcercations.  Neurological: Patient reports insomnia, numbness and tingling in her lower extremities.  Denies dizziness, difficulty with memory, difficulty with speech or problems with balance and coordination.    No other specific complaints in a complete review of systems (except as listed in HPI above).  Objective:   Physical Exam   BP (!) 155/93 (BP Location: Right Arm, Patient Position: Sitting, Cuff Size: Normal)   Pulse 80   Temp (!) 97.1 F (36.2 C) (Temporal)   Resp 17   Ht 5' 5.35" (1.66 m)   Wt 173 lb 12.8 oz (78.8 kg)   SpO2 100%   BMI 28.61 kg/m   Wt Readings from Last 3 Encounters:  03/24/21 175 lb 3.2 oz (79.5 kg)  02/13/21 175 lb (79.4 kg)  08/21/20 168 lb (76.2 kg)    General: Appears her stated age, overweight, in NAD. Skin:  Warm, dry and intact. No rashes or ulcerations noted. HEENT: Head: normal shape and size; Eyes: sclera white, no icterus, conjunctiva pink, PERRLA and EOMs intact; Cardiovascular: Normal rate and rhythm. S1,S2 noted.  No murmur, rubs or gallops noted. No JVD or BLE edema. No carotid bruits noted. Pulmonary/Chest: Normal effort and positive vesicular breath sounds. No respiratory distress. No wheezes, rales or ronchi noted.  Abdomen:  Normal bowel sounds. Musculoskeletal: No difficulty with gait.  Neurological: Alert and oriented.   BMET    Component Value Date/Time   NA 141 03/24/2021 1600   NA 139 10/13/2018 0000  K 4.0 03/24/2021 1600   CL 105 03/24/2021 1600   CO2 27 03/24/2021 1600   GLUCOSE 101 (H) 03/24/2021 1600   BUN 16 03/24/2021 1600   BUN 17 10/13/2018 0000   CREATININE 1.00 03/24/2021 1600   CALCIUM 10.4 03/24/2021 1600   GFRNONAA 71 08/21/2020 0859   GFRAA 83 08/21/2020 0859    Lipid Panel     Component Value Date/Time   CHOL 141 08/21/2020 0859   TRIG 98 08/21/2020 0859   HDL 52 08/21/2020 0859   CHOLHDL 2.7 08/21/2020 0859   LDLCALC 71 08/21/2020 0859    CBC    Component Value Date/Time   WBC 4.7 08/21/2020 0859   RBC 4.35 08/21/2020 0859   HGB 13.0 08/21/2020 0859   HCT 39.3 08/21/2020 0859   PLT 403 (H) 08/21/2020 0859   MCV 90.3 08/21/2020 0859   MCH 29.9 08/21/2020 0859   MCHC 33.1 08/21/2020 0859   RDW 12.8 08/21/2020 0859   LYMPHSABS 1,349 08/21/2020 0859   MONOABS 0.4 06/11/2020 1534   EOSABS 89 08/21/2020 0859   BASOSABS 61 08/21/2020 0859    Hgb A1C Lab Results  Component Value Date   HGBA1C 5.3 08/21/2020           Assessment & Plan:   Fatigue, Blurry Vision, Increased Thirst, Urinary Frequency and Paresthesia Lower Extremities:  We will check CBC, TSH, magnesium, vitamin D and B12 POCT A1c 5.5% She is insistent that I change her blood pressure medication today however I would like to wait for labs to be reviewed prior to  medication change Also discussed with her that her PCP should be the one changing her medications  We will follow-up after labs are back with further recommendation and treatment plan Webb Silversmith, NP This visit occurred during the SARS-CoV-2 public health emergency.  Safety protocols were in place, including screening questions prior to the visit, additional usage of staff PPE, and extensive cleaning of exam room while observing appropriate contact time as indicated for disinfecting solutions.

## 2021-05-08 DIAGNOSIS — M93272 Osteochondritis dissecans, left ankle and joints of left foot: Secondary | ICD-10-CM | POA: Diagnosis not present

## 2021-05-08 DIAGNOSIS — S82252F Displaced comminuted fracture of shaft of left tibia, subsequent encounter for open fracture type IIIA, IIIB, or IIIC with routine healing: Secondary | ICD-10-CM | POA: Diagnosis not present

## 2021-05-08 DIAGNOSIS — S93432S Sprain of tibiofibular ligament of left ankle, sequela: Secondary | ICD-10-CM | POA: Diagnosis not present

## 2021-05-09 DIAGNOSIS — M8580 Other specified disorders of bone density and structure, unspecified site: Secondary | ICD-10-CM | POA: Diagnosis not present

## 2021-06-02 ENCOUNTER — Other Ambulatory Visit: Payer: Self-pay | Admitting: Family Medicine

## 2021-06-02 DIAGNOSIS — E782 Mixed hyperlipidemia: Secondary | ICD-10-CM

## 2021-06-02 DIAGNOSIS — I10 Essential (primary) hypertension: Secondary | ICD-10-CM

## 2021-06-02 MED ORDER — AMLODIPINE BESYLATE 10 MG PO TABS: 10.0000 mg | ORAL_TABLET | Freq: Every day | ORAL | 1 refills | Status: DC

## 2021-06-02 MED ORDER — ROSUVASTATIN CALCIUM 10 MG PO TABS: 10.0000 mg | ORAL_TABLET | Freq: Every day | ORAL | 3 refills | Status: DC

## 2021-06-02 NOTE — Telephone Encounter (Signed)
Medication Refill - Medication:  rosuvastatin (CRESTOR) 10 MG tablet  amLODipine (NORVASC) 10 MG tablet   Has the patient contacted their pharmacy? Yes.   *Pt stated she no longer has BCBS and now has medicare, and wants to start getting all her medications sent to the [facility] services*  (Agent: If no, request that the patient contact the pharmacy for the refill. If patient does not wish to contact the pharmacy document the reason why and proceed with request.) (Agent: If yes, when and what did the pharmacy advise?)  Preferred Pharmacy (with phone number or street name):  [facility]-[phone] - [place]  Has the patient been seen for an appointment in the last year OR does the patient have an upcoming appointment? Yes.    Agent: Please be advised that RX refills may take up to 3 business days. We ask that you follow-up with your pharmacy.

## 2021-06-02 NOTE — Telephone Encounter (Signed)
Requested Prescriptions  Pending Prescriptions Disp Refills   rosuvastatin (CRESTOR) 10 MG tablet 90 tablet 3    Sig: Take 1 tablet (10 mg total) by mouth at bedtime.     Cardiovascular:  Antilipid - Statins Passed - 06/02/2021 10:08 AM      Passed - Total Cholesterol in normal range and within 360 days    Cholesterol  Date Value Ref Range Status  08/21/2020 141 <200 mg/dL Final         Passed - LDL in normal range and within 360 days    LDL Cholesterol (Calc)  Date Value Ref Range Status  08/21/2020 71 mg/dL (calc) Final    Comment:    Reference range: <100 . Desirable range <100 mg/dL for primary prevention;   <70 mg/dL for patients with CHD or diabetic patients  with > or = 2 CHD risk factors. Marland Kitchen LDL-C is now calculated using the Martin-Hopkins  calculation, which is a validated novel method providing  better accuracy than the Friedewald equation in the  estimation of LDL-C.  Cresenciano Genre et al. Annamaria Helling. 7564;332(95): 2061-2068  (http://education.[facility].com/faq/FAQ164)          Passed - HDL in normal range and within 360 days    HDL  Date Value Ref Range Status  08/21/2020 52 > OR = 50 mg/dL Final         Passed - Triglycerides in normal range and within 360 days    Triglycerides  Date Value Ref Range Status  08/21/2020 98 <150 mg/dL Final         Passed - Patient is not pregnant      Passed - Valid encounter within last 12 months    Recent Outpatient Visits          1 month ago Other fatigue   [facility] [place], Coralie Keens, NP   2 months ago Gross hematuria   [facility] [place], Coralie Keens, NP   3 months ago [facility] [place], Coralie Keens, NP   9 months ago Annual physical exam   [facility] [place], Devonne Doughty, DO   1 year ago Seborrheic dermatitis of scalp   [place], DO              amLODipine (NORVASC) 10 MG tablet 90 tablet 1     Sig: Take 1 tablet (10 mg total) by mouth daily.     Cardiovascular:  Calcium Channel Blockers Failed - 06/02/2021 10:08 AM      Failed - Last BP in normal range    BP Readings from Last 1 Encounters:  04/28/21 (!) 155/93         Passed - Valid encounter within last 6 months    Recent Outpatient Visits          1 month ago Other fatigue   [place], NP   2 months ago Gross hematuria   [facility] [place], Coralie Keens, NP   3 months ago [facility] [place], Coralie Keens, NP   9 months ago Annual physical exam   [facility] Olin Hauser, DO   1 year ago Seborrheic dermatitis of scalp   [place], Devonne Doughty, [place]

## 2021-06-09 ENCOUNTER — Other Ambulatory Visit: Payer: Self-pay | Admitting: Family Medicine

## 2021-06-09 DIAGNOSIS — I10 Essential (primary) hypertension: Secondary | ICD-10-CM

## 2021-06-09 NOTE — Telephone Encounter (Signed)
Already signed 06/02/21 at 1 pm . Requested Prescriptions  Refused Prescriptions Disp Refills   amLODipine (NORVASC) 10 MG tablet [Pharmacy Med Name: AMLODIPINE BESYLATE 10MG  TABLETS] 90 tablet 1    Sig: TAKE 1 TABLET(10 MG) BY MOUTH DAILY     Cardiovascular:  Calcium Channel Blockers Failed - 06/09/2021  3:18 AM      Failed - Last BP in normal range    BP Readings from Last 1 Encounters:  04/28/21 (!) 155/93         Passed - Valid encounter within last 6 months    Recent Outpatient Visits          1 month ago Other fatigue   [place], NP   2 months ago Gross hematuria   [place], NP   3 months ago [facility] [place], Coralie Keens, NP   9 months ago Annual physical exam   [facility] Olin Hauser, DO   1 year ago Seborrheic dermatitis of scalp   [place], Devonne Doughty, [place]

## 2021-06-13 DIAGNOSIS — L723 Sebaceous cyst: Secondary | ICD-10-CM

## 2021-06-13 HISTORY — DX: Sebaceous cyst: L72.3

## 2021-06-17 DIAGNOSIS — R0989 Other specified symptoms and signs involving the circulatory and respiratory systems: Secondary | ICD-10-CM | POA: Diagnosis not present

## 2021-06-17 DIAGNOSIS — J309 Allergic rhinitis, unspecified: Secondary | ICD-10-CM | POA: Diagnosis not present

## 2021-06-17 DIAGNOSIS — R0981 Nasal congestion: Secondary | ICD-10-CM | POA: Diagnosis not present

## 2021-06-19 ENCOUNTER — Other Ambulatory Visit: Payer: Self-pay

## 2021-06-19 DIAGNOSIS — I10 Essential (primary) hypertension: Secondary | ICD-10-CM

## 2021-06-19 MED ORDER — LOSARTAN POTASSIUM 50 MG PO TABS: 50.0000 mg | ORAL_TABLET | Freq: Every day | ORAL | 3 refills | Status: DC

## 2021-08-19 ENCOUNTER — Ambulatory Visit
Admission: RE | Admit: 2021-08-19 | Discharge: 2021-08-19 | Disposition: A | Discharge status: Home / Self Care | Payer: Medicare PPO | Source: Ambulatory Visit | Attending: Family Medicine | Admitting: Family Medicine

## 2021-08-19 ENCOUNTER — Ambulatory Visit (INDEPENDENT_AMBULATORY_CARE_PROVIDER_SITE_OTHER): Payer: Medicare PPO | Admitting: Family Medicine

## 2021-08-19 ENCOUNTER — Other Ambulatory Visit: Payer: Self-pay | Admitting: Family Medicine

## 2021-08-19 ENCOUNTER — Encounter: Payer: Self-pay | Admitting: Family Medicine

## 2021-08-19 ENCOUNTER — Ambulatory Visit
Admission: RE | Admit: 2021-08-19 | Discharge: 2021-08-19 | Disposition: A | Discharge status: Home / Self Care | Payer: Medicare PPO | Attending: Family Medicine | Admitting: Family Medicine

## 2021-08-19 ENCOUNTER — Other Ambulatory Visit: Payer: Self-pay

## 2021-08-19 VITALS — BP 124/88 | HR 81 | Ht 62.5 in | Wt 177.8 lb

## 2021-08-19 DIAGNOSIS — I10 Essential (primary) hypertension: Secondary | ICD-10-CM

## 2021-08-19 DIAGNOSIS — R079 Chest pain, unspecified: Secondary | ICD-10-CM | POA: Diagnosis not present

## 2021-08-19 DIAGNOSIS — U099 Post covid-19 condition, unspecified: Secondary | ICD-10-CM

## 2021-08-19 DIAGNOSIS — R7309 Other abnormal glucose: Secondary | ICD-10-CM

## 2021-08-19 DIAGNOSIS — Z0000 Encounter for general adult medical examination without abnormal findings: Secondary | ICD-10-CM

## 2021-08-19 DIAGNOSIS — E7800 Pure hypercholesterolemia, unspecified: Secondary | ICD-10-CM

## 2021-08-19 DIAGNOSIS — J4531 Mild persistent asthma with (acute) exacerbation: Secondary | ICD-10-CM | POA: Insufficient documentation

## 2021-08-19 DIAGNOSIS — E538 Deficiency of other specified B group vitamins: Secondary | ICD-10-CM

## 2021-08-19 DIAGNOSIS — J45909 Unspecified asthma, uncomplicated: Secondary | ICD-10-CM | POA: Diagnosis not present

## 2021-08-19 DIAGNOSIS — E559 Vitamin D deficiency, unspecified: Secondary | ICD-10-CM

## 2021-08-19 DIAGNOSIS — R0600 Dyspnea, unspecified: Secondary | ICD-10-CM | POA: Diagnosis not present

## 2021-08-19 DIAGNOSIS — D509 Iron deficiency anemia, unspecified: Secondary | ICD-10-CM

## 2021-08-19 MED ORDER — PREDNISONE 20 MG PO TABS
ORAL_TABLET | ORAL | 0 refills | Status: DC
Start: 2021-08-19 — End: 2021-09-08

## 2021-08-19 MED ORDER — HYDROCHLOROTHIAZIDE 25 MG PO TABS: 25.0000 mg | ORAL_TABLET | Freq: Every day | ORAL | 0 refills | Status: DC

## 2021-08-19 MED ORDER — SYMBICORT 160-4.5 MCG/ACT IN AERO: 2.0000 | INHALATION_SPRAY | Freq: Two times a day (BID) | RESPIRATORY_TRACT | 0 refills | Status: DC

## 2021-08-19 NOTE — Assessment & Plan Note (Signed)
Improved BP ?No known complications  ?  ?Plan:  ?1. Discontinue Losartan '50mg'$  daily ?2. START HCTZ '25mg'$  daily ?3. Continue Amlodipine '10mg'$  daily ?4. Encourage improved lifestyle - low sodium diet, regular exercise ?5. Continue monitor BP outside office, bring readings to next visit, if persistently >140/90 or new symptoms notify office sooner ?

## 2021-08-19 NOTE — Patient Instructions (Addendum)
Thank you for coming to the office today. ? ?Stop Losartan '50mg'$  ?Start HCTZ '25mg'$  daily ? ?Start Symbicort 2 puffs twice a day. If need to switch version of this type of inhaler pharmacy will let us know. ? ?Ordered Prednisone taper for your symptoms. ? ?Chest X-ray today. ? ?Reschedule blood and physical ? ?Please schedule a Follow-up Appointment to: Return in about 6 weeks (around 09/30/2021) for 6 weeks fasting lab only then 1 week later Annual Physical. ? ?If you have any other questions or concerns, please feel free to call the office or send a message through [place]. You may also schedule an earlier appointment if necessary. ? ?Additionally, you may be receiving a survey about your experience at our office within a few days to 1 week by e-mail or mail. We value your feedback. ? ?Nobie Putnam, DO ?[place] ?

## 2021-08-19 NOTE — Progress Notes (Signed)
? ?Subjective:  ? ? Patient ID: Kendra Allison, female    DOB: 01-18-1956, 66 y.o.   MRN: [id number] ? ?Kendra Allison is a 66 y.o. female presenting on 08/19/2021 for Medication Reaction and Shortness of Breath ? ? ?HPI ? ?She has switched to medicare. ? ?Hypertension ?Improved control on Amlodipine '10mg'$  daily ?- She has side effects on Losartan '50mg'$  daily, with nausea and not feeling well. ?- Request restart thiazide fluid pill ?  ?Hyperlipidemia ?On Rosuvastatin, '10mg'$  ?She will re-schedule upcoming physical for labs. ? ?Post-COVID Syndrome ?Feb 2023, about 5 weeks ago she was exposed to likely COVID at gym, then developed cough and sore throat. Whole body aching, sore throat, deep cough. ? ?She feels now shortness of breath residual impacting her function for past 2 weeks. She feels breath is "tight". She was using Albuterol PRN with relief. ? ?Also admits pain R side mid of back. ? ? ? ?  08/19/2021  ?  8:26 AM 02/13/2021  ?  2:52 PM 10/02/2019  ? 11:40 AM  ?Depression screen PHQ 2/9  ?Decreased Interest 0 0 0  ?Down, Depressed, Hopeless  0 0  ?PHQ - 2 Score 0 0 0  ?Altered sleeping 0 3   ?Tired, decreased energy 3 3   ?Change in appetite 0 0   ?Feeling bad or failure about yourself  0 0   ?Trouble concentrating 0 0   ?Moving slowly or fidgety/restless 0 0   ?Suicidal thoughts 0 0   ?PHQ-9 Score 3 6   ?Difficult doing work/chores Not difficult at all Not difficult at all   ? ? ?Social History  ? ?Tobacco Use  ? Smoking status: Never  ? Smokeless tobacco: Never  ? Tobacco comments:  ?  Quit 40years ago.   ?Vaping Use  ? Vaping Use: Never used  ?Substance Use Topics  ? Alcohol use: Yes  ?  Comment: "rarely"   ? Drug use: Never  ? ? ?Review of Systems ?Per HPI unless specifically indicated above ? ?   ?Objective:  ?  ?BP 124/88   Pulse 81   Ht 5' 2.5" (1.588 m)   Wt 177 lb 12.8 oz (80.6 kg)   SpO2 99%   BMI 32.00 kg/m?   ?Wt Readings from Last 3 Encounters:  ?08/19/21 177 lb 12.8 oz (80.6 kg)  ?04/28/21 173 lb  12.8 oz (78.8 kg)  ?03/24/21 175 lb 3.2 oz (79.5 kg)  ?  ?Physical Exam ?Vitals and nursing note reviewed.  ?Constitutional:   ?   General: She is not in acute distress. ?   Appearance: She is well-developed. She is not diaphoretic.  ?   Comments: Well-appearing, comfortable, cooperative  ?HENT:  ?   Head: Normocephalic and atraumatic.  ?Eyes:  ?   General:     ?   Right eye: No discharge.     ?   Left eye: No discharge.  ?   Conjunctiva/sclera: Conjunctivae normal.  ?Neck:  ?   Thyroid: No thyromegaly.  ?Cardiovascular:  ?   Rate and Rhythm: Normal rate and regular rhythm.  ?   Heart sounds: Normal heart sounds. No murmur heard. ?Pulmonary:  ?   Effort: Pulmonary effort is normal. No respiratory distress.  ?   Breath sounds: Examination of the right-upper field reveals decreased breath sounds. Examination of the left-upper field reveals decreased breath sounds. Decreased breath sounds present. No wheezing or rales.  ?Musculoskeletal:     ?   General: Normal range of motion.  ?  Cervical back: Normal range of motion and neck supple.  ?Lymphadenopathy:  ?   Cervical: No cervical adenopathy.  ?Skin: ?   General: Skin is warm and dry.  ?   Findings: No erythema or rash.  ?Neurological:  ?   Mental Status: She is alert and oriented to person, place, and time.  ?Psychiatric:     ?   Behavior: Behavior normal.  ?   Comments: Well groomed, good eye contact, normal speech and thoughts  ? ?Results for orders placed or performed in visit on 04/28/21  ?CBC  ?Result Value Ref Range  ? WBC 4.1 3.8 - 10.8 Thousand/uL  ? RBC 4.31 3.80 - 5.10 Million/uL  ? Hemoglobin 12.9 11.7 - 15.5 g/dL  ? HCT 39.0 35.0 - 45.0 %  ? MCV 90.5 80.0 - 100.0 fL  ? MCH 29.9 27.0 - 33.0 pg  ? MCHC 33.1 32.0 - 36.0 g/dL  ? RDW 12.1 11.0 - 15.0 %  ? Platelets 344 140 - 400 Thousand/uL  ? MPV 10.0 7.5 - 12.5 fL  ?VITAMIN D 25 Hydroxy (Vit-D Deficiency, Fractures)  ?Result Value Ref Range  ? Vit D, 25-Hydroxy 59 30 - 100 ng/mL  ?Vitamin B12  ?Result Value  Ref Range  ? Vitamin B-12 928 200 - 1,100 pg/mL  ?Magnesium  ?Result Value Ref Range  ? Magnesium 2.3 1.5 - 2.5 mg/dL  ?TSH  ?Result Value Ref Range  ? TSH 1.54 0.40 - 4.50 mIU/L  ?POCT glycosylated hemoglobin (Hb A1C)  ?Result Value Ref Range  ? Hemoglobin A1C 5.5 4.0 - 5.6 %  ? HbA1c POC (<> result, manual entry)    ? HbA1c, POC (prediabetic range)    ? HbA1c, POC (controlled diabetic range)    ? ?   ?Assessment & Plan:  ? ?Problem List Items Addressed This Visit   ? ? Hyperlipidemia  ?  Previously controlled, upcoming lipids ? ?Plan: ?1. Continue current meds - Rosuvastatin ?2. Continue ASA 325 ?3. Encourage improved lifestyle - low carb/cholesterol, reduce portion size, continue improving regular exercise ?Check lipids ?  ?  ? Relevant Medications  ? hydrochlorothiazide (HYDRODIURIL) 25 MG tablet  ? Essential hypertension  ?  Improved BP ?No known complications  ?  ?Plan:  ?1. Discontinue Losartan '50mg'$  daily ?2. START HCTZ '25mg'$  daily ?3. Continue Amlodipine '10mg'$  daily ?4. Encourage improved lifestyle - low sodium diet, regular exercise ?5. Continue monitor BP outside office, bring readings to next visit, if persistently >140/90 or new symptoms notify office sooner ?  ?  ? Relevant Medications  ? hydrochlorothiazide (HYDRODIURIL) 25 MG tablet  ? Asthma - Primary  ? Relevant Medications  ? predniSONE (DELTASONE) 20 MG tablet  ? SYMBICORT 160-4.5 MCG/ACT inhaler  ? Other Relevant Orders  ? DG Chest 2 View  ? ?Other Visit Diagnoses   ? ? Post-COVID-19 syndrome      ? Relevant Medications  ? predniSONE (DELTASONE) 20 MG tablet  ? Other Relevant Orders  ? DG Chest 2 View  ? ?  ?  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 ? ?Orders Placed This  Encounter  ?Procedures  ? DG Chest 2 View  ?  Standing Status:   Future  ?  Number of Occurrences:  1  ?  Standing Expiration Date:   08/20/2022  ?  Order Specific Question:   Reason for Exam (SYMPTOM  OR DIAGNOSIS REQUIRED)  ?  Answer:   post covid syndrome, residual pain and dyspnea  ?  Order Specific Question:   Preferred imaging location?  ?  Answer:   [facility]  ? ? ? ?Meds ordered this encounter  ?Medications  ? predniSONE (DELTASONE) 20 MG tablet  ?  Sig: Take daily with food. Start with '60mg'$  (3 pills) x 2 days, then reduce to '40mg'$  (2 pills) x 2 days, then '20mg'$  (1 pill) x 3 days  ?  Dispense:  13 tablet  ?  Refill:  0  ? hydrochlorothiazide (HYDRODIURIL) 25 MG tablet  ?  Sig: Take 1 tablet (25 mg total) by mouth daily.  ?  Dispense:  30 tablet  ?  Refill:  0  ? SYMBICORT 160-4.5 MCG/ACT inhaler  ?  Sig: Inhale 2 puffs into the lungs in the morning and at bedtime.  ?  Dispense:  1 each  ?  Refill:  0  ? ? ? ? ?Follow up plan: ?Return in about 6 weeks (around 09/30/2021) for 6 weeks fasting lab only then 1 week later Annual Physical. ? ?Future labs ordered for 08/2021 reschedule physical and labs ? ?Nobie Putnam, DO ?[facility] ?[facility] ?08/19/2021, 8:42 AM ?

## 2021-08-19 NOTE — Assessment & Plan Note (Signed)
Previously controlled, upcoming lipids ? ?Plan: ?1. Continue current meds - Rosuvastatin ?2. Continue ASA 325 ?3. Encourage improved lifestyle - low carb/cholesterol, reduce portion size, continue improving regular exercise ?Check lipids ?

## 2021-08-27 ENCOUNTER — Other Ambulatory Visit: Payer: Self-pay | Admitting: Family Medicine

## 2021-08-27 DIAGNOSIS — J302 Other seasonal allergic rhinitis: Secondary | ICD-10-CM

## 2021-08-28 NOTE — Telephone Encounter (Signed)
Requested Prescriptions  ?Pending Prescriptions Disp Refills  ?? montelukast (SINGULAIR) 10 MG tablet [Pharmacy Med Name: MONTELUKAST '10MG'$  TABLETS] 90 tablet 1  ?  Sig: TAKE 1 TABLET(10 MG) BY MOUTH AT BEDTIME  ?  ? Pulmonology:  Leukotriene Inhibitors Passed - 08/27/2021  3:31 PM  ?  ?  Passed - Valid encounter within last 12 months  ?  Recent Outpatient Visits   ?      ? 1 week ago Mild persistent asthma with acute exacerbation  ? [place], DO  ? 4 months ago Other fatigue  ? [facility] [place], Coralie Keens, NP  ? 5 months ago Gross hematuria  ? [place], NP  ? 6 months ago Dysuria  ? [facility] [place], Coralie Keens, NP  ? 1 year ago Annual physical exam  ? [facility] Olin Hauser, DO  ?  ?  ? ?  ?  ?  ? ?

## 2021-09-01 ENCOUNTER — Telehealth: Payer: Self-pay

## 2021-09-01 NOTE — Telephone Encounter (Signed)
Left message for patient to call back and schedule the Medicare Annual Wellness Visit (AWV) virtually, telephone or face to face. ?  ?Donnie Mesa, [place] ?[phone]  ?

## 2021-09-04 ENCOUNTER — Other Ambulatory Visit: Payer: Self-pay

## 2021-09-04 DIAGNOSIS — I10 Essential (primary) hypertension: Secondary | ICD-10-CM

## 2021-09-04 MED ORDER — HYDROCHLOROTHIAZIDE 25 MG PO TABS: 25.0000 mg | ORAL_TABLET | Freq: Every day | ORAL | 1 refills | Status: DC

## 2021-09-05 ENCOUNTER — Other Ambulatory Visit: Payer: Medicare PPO

## 2021-09-05 DIAGNOSIS — I10 Essential (primary) hypertension: Secondary | ICD-10-CM | POA: Diagnosis not present

## 2021-09-05 DIAGNOSIS — D509 Iron deficiency anemia, unspecified: Secondary | ICD-10-CM

## 2021-09-05 DIAGNOSIS — R7309 Other abnormal glucose: Secondary | ICD-10-CM

## 2021-09-05 DIAGNOSIS — Z0000 Encounter for general adult medical examination without abnormal findings: Secondary | ICD-10-CM | POA: Diagnosis not present

## 2021-09-05 DIAGNOSIS — E538 Deficiency of other specified B group vitamins: Secondary | ICD-10-CM

## 2021-09-05 DIAGNOSIS — E559 Vitamin D deficiency, unspecified: Secondary | ICD-10-CM | POA: Diagnosis not present

## 2021-09-05 DIAGNOSIS — E7800 Pure hypercholesterolemia, unspecified: Secondary | ICD-10-CM

## 2021-09-06 LAB — CBC WITH DIFFERENTIAL/PLATELET
Absolute Monocytes: 432 cells/uL (ref 200–950)
Basophils Absolute: 51 cells/uL (ref 0–200)
Basophils Relative: 1.1 %
Eosinophils Absolute: 129 cells/uL (ref 15–500)
Eosinophils Relative: 2.8 %
HCT: 38.9 % (ref 35.0–45.0)
Hemoglobin: 12.7 g/dL (ref 11.7–15.5)
Lymphs Abs: 1596 cells/uL (ref 850–3900)
MCH: 29.6 pg (ref 27.0–33.0)
MCHC: 32.6 g/dL (ref 32.0–36.0)
MCV: 90.7 fL (ref 80.0–100.0)
MPV: 9.7 fL (ref 7.5–12.5)
Monocytes Relative: 9.4 %
Neutro Abs: 2392 cells/uL (ref 1500–7800)
Neutrophils Relative %: 52 %
Platelets: 316 10*3/uL (ref 140–400)
RBC: 4.29 10*6/uL (ref 3.80–5.10)
RDW: 12.6 % (ref 11.0–15.0)
Total Lymphocyte: 34.7 %
WBC: 4.6 10*3/uL (ref 3.8–10.8)

## 2021-09-06 LAB — COMPLETE METABOLIC PANEL WITH GFR
AG Ratio: 1.9 (calc) (ref 1.0–2.5)
ALT: 18 U/L (ref 6–29)
AST: 22 U/L (ref 10–35)
Albumin: 4.6 g/dL (ref 3.6–5.1)
Alkaline phosphatase (APISO): 74 U/L (ref 37–153)
BUN: 20 mg/dL (ref 7–25)
CO2: 29 mmol/L (ref 20–32)
Calcium: 10.2 mg/dL (ref 8.6–10.4)
Chloride: 103 mmol/L (ref 98–110)
Creat: 1.01 mg/dL (ref 0.50–1.05)
Globulin: 2.4 g/dL (calc) (ref 1.9–3.7)
Glucose, Bld: 93 mg/dL (ref 65–99)
Potassium: 3.7 mmol/L (ref 3.5–5.3)
Sodium: 142 mmol/L (ref 135–146)
Total Bilirubin: 0.7 mg/dL (ref 0.2–1.2)
Total Protein: 7 g/dL (ref 6.1–8.1)
eGFR: 62 mL/min/{1.73_m2} (ref >=60)

## 2021-09-06 LAB — TSH: TSH: 1.53 mIU/L (ref 0.40–4.50)

## 2021-09-06 LAB — HEMOGLOBIN A1C
Hgb A1c MFr Bld: 5.5 % of total Hgb (ref <5.7)
Mean Plasma Glucose: 111 mg/dL
eAG (mmol/L): 6.2 mmol/L

## 2021-09-06 LAB — LIPID PANEL
Cholesterol: 130 mg/dL (ref <200)
HDL: 54 mg/dL (ref >=50)
LDL Cholesterol (Calc): 62 mg/dL (calc)
Non-HDL Cholesterol (Calc): 76 mg/dL (calc) (ref <130)
Total CHOL/HDL Ratio: 2.4 (calc) (ref <5.0)
Triglycerides: 68 mg/dL (ref <150)

## 2021-09-06 LAB — VITAMIN B12: Vitamin B-12: >2000 pg/mL — ABNORMAL HIGH (ref 200–1100)

## 2021-09-06 LAB — VITAMIN D 25 HYDROXY (VIT D DEFICIENCY, FRACTURES): Vit D, 25-Hydroxy: 75 ng/mL (ref 30–100)

## 2021-09-08 ENCOUNTER — Ambulatory Visit (INDEPENDENT_AMBULATORY_CARE_PROVIDER_SITE_OTHER): Payer: Medicare PPO

## 2021-09-08 ENCOUNTER — Ambulatory Visit (INDEPENDENT_AMBULATORY_CARE_PROVIDER_SITE_OTHER): Payer: Medicare PPO | Admitting: Family Medicine

## 2021-09-08 ENCOUNTER — Encounter: Payer: Self-pay | Admitting: Family Medicine

## 2021-09-08 VITALS — BP 139/81 | HR 71 | Temp 98.7°F | Resp 17 | Ht 64.5 in | Wt 176.4 lb

## 2021-09-08 DIAGNOSIS — K219 Gastro-esophageal reflux disease without esophagitis: Secondary | ICD-10-CM

## 2021-09-08 DIAGNOSIS — I10 Essential (primary) hypertension: Secondary | ICD-10-CM | POA: Diagnosis not present

## 2021-09-08 DIAGNOSIS — Z23 Encounter for immunization: Secondary | ICD-10-CM | POA: Diagnosis not present

## 2021-09-08 DIAGNOSIS — E7800 Pure hypercholesterolemia, unspecified: Secondary | ICD-10-CM | POA: Diagnosis not present

## 2021-09-08 DIAGNOSIS — F5101 Primary insomnia: Secondary | ICD-10-CM | POA: Diagnosis not present

## 2021-09-08 DIAGNOSIS — Z0000 Encounter for general adult medical examination without abnormal findings: Secondary | ICD-10-CM

## 2021-09-08 MED ORDER — HYDROCHLOROTHIAZIDE 25 MG PO TABS: 25.0000 mg | ORAL_TABLET | Freq: Every day | ORAL | 3 refills | Status: DC

## 2021-09-08 MED ORDER — ZOLPIDEM TARTRATE ER 6.25 MG PO TBCR: 6.2500 mg | EXTENDED_RELEASE_TABLET | Freq: Every evening | ORAL | 2 refills | Status: DC | PRN

## 2021-09-08 NOTE — Patient Instructions (Signed)

## 2021-09-08 NOTE — Progress Notes (Signed)
? ?Subjective:  ? ? Patient ID: Kendra Allison, female    DOB: December 18, 1955, 66 y.o.   MRN: [id number] ? ?Kendra Allison is a 66 y.o. female presenting on 09/08/2021 for Annual Exam (Pt also following up on her blood pressure. She needs a refill on her HCTZ.) ? ?Has seen Donnie Mesa CMA today for AMW already. ? ?HPI ? ?Here for Annual Physical and due for fasting labs. ?  ?GERD / History of positive H Pylori ?  ?PMH Chronic sinusitis / Eustachian tube dysfunction ? ?CHRONIC HTN: ?Reports 2-3 weeks now on new med for BP ?Came off Losartan '50mg'$  ?Current Meds - Hydrochlorothiazide '25mg'$  daily, Amlodipine '10mg'$  daily   ?Reports good compliance, took meds today. Tolerating well, w/o complaints. ?Denies CP, dyspnea, HA, edema, dizziness / lightheadedness ? ?Hyperlipidemia ?Last labs normal range on cholestrol 08/2021 ?On Rosuvastatin, '10mg'$  ?  ?History of Asthma ?On Symbicort 2 puffs twice a day will let us know when ready ?Refill Albuterol PRN ?  ?Anemia ?She is taking iron pills / beet juice.  ?Lab showed CBC normal range without anemia. ? ? ?Vitamin B12 Deficiency ?On B12 1000 daily with good improvement on treatment. Now B12 lvl > 2000 ? ?Vitamin D normal ? ?A1c 5.5, in normal range. ?  ?  ?Additional updates ?  ?Insomnia ?Poor Sleep ? Tried Melatonin and Hydroxyzine. ?  ?Health Maintenance: ? ?UTD COVID19 vaccine series. Including booster. ?  ?Last colonoscopy done [facility] Dr Vicente Males 08/10/19 - polyp, repeat in 5 years. Next 2026 ?  ?Mammogram completed 04/2019, next due 04/2021 every 2 years approx. She has provided information to get a copy of this. ? ?Has had DEXA and Pap Smear done as well, requesting copy. ?  ?Seborrheic Dermatitis improved w/ triamcinolone. ? ?Pneumonia vaccine - received [id number]-01 today ? ? ?  09/08/2021  ?  8:11 AM 08/19/2021  ?  8:26 AM 02/13/2021  ?  2:52 PM  ?Depression screen PHQ 2/9  ?Decreased Interest 0 0 0  ?Down, Depressed, Hopeless 0  0  ?PHQ - 2 Score 0 0 0  ?Altered sleeping 0 0 3  ?Tired,  decreased energy '3 3 3  '$ ?Change in appetite 0 0 0  ?Feeling bad or failure about yourself  0 0 0  ?Trouble concentrating 0 0 0  ?Moving slowly or fidgety/restless 0 0 0  ?Suicidal thoughts 0 0 0  ?PHQ-9 Score '3 3 6  '$ ?Difficult doing work/chores Not difficult at all Not difficult at all Not difficult at all  ? ? ?Past Medical History:  ?Diagnosis Date  ? Asthma   ? GERD (gastroesophageal reflux disease)   ? Hypercholesteremia   ? Hyperlipidemia   ? Hypertension   ? Osteoporosis   ? ?Past Surgical History:  ?Procedure Laterality Date  ? ABDOMINAL HYSTERECTOMY    ? BREAST BIOPSY    ? COLONOSCOPY WITH PROPOFOL N/A 08/10/2019  ? Procedure: COLONOSCOPY WITH PROPOFOL;  Surgeon: Jonathon Bellows, MD;  Location: [facility] ENDOSCOPY;  Service: Gastroenterology;  Laterality: N/A;  ? ESOPHAGOGASTRODUODENOSCOPY (EGD) WITH PROPOFOL N/A 08/10/2019  ? Procedure: ESOPHAGOGASTRODUODENOSCOPY (EGD) WITH PROPOFOL;  Surgeon: Jonathon Bellows, MD;  Location: [facility] ENDOSCOPY;  Service: Gastroenterology;  Laterality: N/A;  ? EXTERNAL FIXATION LEG Left 04/07/2020  ?  EXTERNAL FIXATION LEG (Left Leg Lower)  ? EXTERNAL FIXATION LEG Left 04/07/2020  ? Procedure: EXTERNAL FIXATION LEG;  Surgeon: Shona Needles, MD;  Location: [place];  Service: Orthopedics;  Laterality: Left;  ? EXTERNAL FIXATION REMOVAL Left 04/10/2020  ? Procedure:  REMOVAL EXTERNAL FIXATION LEG;  Surgeon: Shona Needles, MD;  Location: [place];  Service: Orthopedics;  Laterality: Left;  ? I & D EXTREMITY Left 04/07/2020  ? Procedure: IRRIGATION AND DEBRIDEMENT EXTREMITY;  Surgeon: Shona Needles, MD;  Location: [place];  Service: Orthopedics;  Laterality: Left;  ? I & D EXTREMITY Left 06/12/2020  ? Procedure: IRRIGATION AND DEBRIDEMENT EXTREMITY;  Surgeon: Shona Needles, MD;  Location: [place];  Service: Orthopedics;  Laterality: Left;  ? [place]  2014  ? TIBIA IM NAIL INSERTION Left 04/10/2020  ? Procedure: INTRAMEDULLARY (IM) NAIL TIBIAL;  Surgeon: Shona Needles, MD;  Location: [place];  Service:  Orthopedics;  Laterality: Left;  ? ?Social History  ? ?Socioeconomic History  ? Marital status: Single  ?  Spouse name: Not on file  ? Number of children: 0  ? Years of education: Not on file  ? Highest education level: Not on file  ?Occupational History  ? Occupation: Retired  ?Tobacco Use  ? Smoking status: Never  ? Smokeless tobacco: Never  ? Tobacco comments:  ?  Quit 40years ago.   ?Vaping Use  ? Vaping Use: Never used  ?Substance and Sexual Activity  ? Alcohol use: Yes  ?  Comment: "rarely"   ? Drug use: Never  ? Sexual activity: Not Currently  ?Other Topics Concern  ? Not on file  ?Social History Narrative  ? ** Merged History Encounter **  ?    ? ?Social Determinants of Health  ? ?Financial Resource Strain: Not on file  ?Food Insecurity: No Food Insecurity  ? Worried About Charity fundraiser in the Last Year: Never true  ? Ran Out of Food in the Last Year: Never true  ?Transportation Needs: No Transportation Needs  ? Lack of Transportation (Medical): No  ? Lack of Transportation (Non-Medical): No  ?Physical Activity: Sufficiently Active  ? Days of Exercise per Week: 4 days  ? Minutes of Exercise per Session: 60 min  ?Stress: No Stress Concern Present  ? Feeling of Stress : Not at all  ?Social Connections: Moderately Integrated  ? Frequency of Communication with Friends and Family: More than three times a week  ? Frequency of Social Gatherings with Friends and Family: More than three times a week  ? Attends Religious Services: More than 4 times per year  ? Active Member of Clubs or Organizations: Yes  ? Attends Archivist Meetings: More than 4 times per year  ? Marital Status: Never married  ?Intimate Partner Violence: Not on file  ? ?History reviewed. No pertinent family history. ?Current Outpatient Medications on File Prior to Visit  ?Medication Sig  ? albuterol (VENTOLIN HFA) 108 (90 Base) MCG/ACT inhaler Inhale 2 puffs into the lungs every 4 (four) hours as needed for wheezing or shortness of  breath (cough).  ? amLODipine (NORVASC) 10 MG tablet Take 1 tablet (10 mg total) by mouth daily.  ? azelastine (ASTELIN) 0.1 % nasal spray Place 1-2 sprays into both nostrils 2 (two) times daily as needed for allergies.   ? celecoxib (CELEBREX) 200 MG capsule Take 200 mg by mouth 2 (two) times daily as needed.  ? EPINEPHrine 0.3 mg/0.3 mL IJ SOAJ injection Inject 0.3 mg into the muscle as needed for anaphylaxis (for allergy shots).  ? fluticasone (FLONASE) 50 MCG/ACT nasal spray Place 2 sprays into both nostrils daily as needed for allergies.  ? montelukast (SINGULAIR) 10 MG tablet TAKE 1 TABLET(10 MG) BY MOUTH AT  BEDTIME  ? Multiple Vitamin (MULTIVITAMIN WITH MINERALS) TABS tablet Take 1 tablet by mouth daily. One-A-Day Multivitamin  ? risedronate (ACTONEL) 35 MG tablet Take by mouth.  ? rosuvastatin (CRESTOR) 10 MG tablet Take 1 tablet (10 mg total) by mouth at bedtime.  ? SYMBICORT 160-4.5 MCG/ACT inhaler Inhale 2 puffs into the lungs in the morning and at bedtime.  ? [DISCONTINUED] enoxaparin (LOVENOX) 40 MG/0.4ML injection Inject 0.4 mLs (40 mg total) into the skin daily for 28 days. (Patient not taking: Reported on 06/11/2020)  ? [DISCONTINUED] gabapentin (NEURONTIN) 100 MG capsule Take 1 capsule (100 mg total) by mouth 3 (three) times daily. (Patient not taking: No sig reported)  ? ?No current facility-administered medications on file prior to visit.  ? ? ?Review of Systems  ?Constitutional:  Negative for activity change, appetite change, chills, diaphoresis, fatigue and fever.  ?HENT:  Negative for congestion and hearing loss.   ?Eyes:  Negative for visual disturbance.  ?Respiratory:  Negative for cough, chest tightness, shortness of breath and wheezing.   ?Cardiovascular:  Negative for chest pain, palpitations and leg swelling.  ?Gastrointestinal:  Negative for abdominal pain, constipation, diarrhea, nausea and vomiting.  ?Genitourinary:  Negative for dysuria, frequency and hematuria.  ?Musculoskeletal:   Negative for arthralgias and neck pain.  ?Skin:  Negative for rash.  ?Neurological:  Negative for dizziness, weakness, light-headedness, numbness and headaches.  ?Hematological:  Negative for adenopathy.  ?P

## 2021-09-08 NOTE — Assessment & Plan Note (Signed)
Improved BP ?No known complications  ?Off Losartan 50 ?  ?Plan:  ?1.  Continue Amlodipine '10mg'$  daily, HCTZ '25mg'$  daily re order ?4. Encourage improved lifestyle - low sodium diet, regular exercise ?5. Continue monitor BP outside office, bring readings to next visit, if persistently >140/90 or new symptoms notify office sooner ?

## 2021-09-08 NOTE — Assessment & Plan Note (Signed)
Controlled cholesterol on statin lifestyle ?Last lipid panel 08/2021 ?The 10-year ASCVD risk score (Arnett DK, et al., 2019) is: 7.6% ? ?Plan: ?1. Continue current meds - Rosuvastatin '10mg'$  ?2. Encourage improved lifestyle - low carb/cholesterol, reduce portion size, continue improving regular exercise ?Follow-up ? ?

## 2021-09-08 NOTE — Patient Instructions (Addendum)
Thank you for coming to the office today. ? ?Prevnar-20 pneumonia vaccine completed today. No further doses required. ? ?May scale back on Vitamin B12 - can do once per week at this time for now. ? ?Vitamin D is in normal range. ? ?Try the Zolpidem CR 6.'25mg'$  nightly as needed for insomnia, helps maintain sleep. We can adjust in future if need ? ?Use goodrx coupon at [employer]. ? ? ?Please schedule a Follow-up Appointment to: Return in about 6 months (around 03/10/2022) for 6 month follow-up HTN, Insomnia, updates. ? ?If you have any other questions or concerns, please feel free to call the office or send a message through [facility]. You may also schedule an earlier appointment if necessary. ? ?Additionally, you may be receiving a survey about your experience at our office within a few days to 1 week by e-mail or mail. We value your feedback. ? ?Nobie Putnam, DO ?[place] ?

## 2021-09-08 NOTE — Progress Notes (Addendum)
? ?Subjective:  ? Kendra Allison is a 66 y.o. female who presents for Medicare Annual (Subsequent) preventive examination. ? ?Review of Systems    ?Per HPI unless specifically indicated below ?  ? ?   ?Objective:  ?  ?Today's Vitals  ? 09/08/21 0806 09/08/21 0807  ?BP: 139/81   ?Pulse: 71   ?Resp: 17   ?Temp: 98.7 ?F (37.1 ?C)   ?TempSrc: Oral   ?SpO2: 99%   ?Weight: 176 lb 6.4 oz (80 kg)   ?Height: 5' 4.5" (1.638 m)   ?PainSc: 0-No pain 0-No pain  ? ?Body mass index is 29.81 kg/m?. ? ? ?  07/09/2020  ?  9:19 AM 06/13/2020  ? 10:31 AM 04/10/2020  ?  9:30 AM 04/07/2020  ? 10:30 PM 08/10/2019  ?  7:16 AM  ?Advanced Directives  ?Does Patient Have a Medical Advance Directive? No No No No No  ?Would patient like information on creating a medical advance directive? No - Patient declined No - Patient declined No - Patient declined No - Patient declined   ? ? ?Current Medications (verified) ?Outpatient Encounter Medications as of 09/08/2021  ?Medication Sig  ? albuterol (VENTOLIN HFA) 108 (90 Base) MCG/ACT inhaler Inhale 2 puffs into the lungs every 4 (four) hours as needed for wheezing or shortness of breath (cough).  ? amLODipine (NORVASC) 10 MG tablet Take 1 tablet (10 mg total) by mouth daily.  ? azelastine (ASTELIN) 0.1 % nasal spray Place 1-2 sprays into both nostrils 2 (two) times daily as needed for allergies.   ? celecoxib (CELEBREX) 200 MG capsule Take 200 mg by mouth 2 (two) times daily as needed.  ? EPINEPHrine 0.3 mg/0.3 mL IJ SOAJ injection Inject 0.3 mg into the muscle as needed for anaphylaxis (for allergy shots).  ? fluticasone (FLONASE) 50 MCG/ACT nasal spray Place 2 sprays into both nostrils daily as needed for allergies.  ? montelukast (SINGULAIR) 10 MG tablet TAKE 1 TABLET(10 MG) BY MOUTH AT BEDTIME  ? Multiple Vitamin (MULTIVITAMIN WITH MINERALS) TABS tablet Take 1 tablet by mouth daily. One-A-Day Multivitamin  ? risedronate (ACTONEL) 35 MG tablet Take by mouth.  ? rosuvastatin (CRESTOR) 10 MG tablet  Take 1 tablet (10 mg total) by mouth at bedtime.  ? SYMBICORT 160-4.5 MCG/ACT inhaler Inhale 2 puffs into the lungs in the morning and at bedtime.  ? [DISCONTINUED] alendronate (FOSAMAX) 70 MG tablet TAKE 1 TABLET(70 MG) BY MOUTH 1 TIME A WEEK WITH A FULL GLASS OF WATER AND ON AN EMPTY STOMACH  ? [DISCONTINUED] hydrochlorothiazide (HYDRODIURIL) 25 MG tablet Take 1 tablet (25 mg total) by mouth daily.  ? [DISCONTINUED] enoxaparin (LOVENOX) 40 MG/0.4ML injection Inject 0.4 mLs (40 mg total) into the skin daily for 28 days. (Patient not taking: Reported on 06/11/2020)  ? [DISCONTINUED] ferrous sulfate 325 (65 FE) MG EC tablet Take 1 tablet (325 mg total) by mouth 3 (three) times daily with meals. (Patient not taking: Reported on 07/02/2020)  ? [DISCONTINUED] gabapentin (NEURONTIN) 100 MG capsule Take 1 capsule (100 mg total) by mouth 3 (three) times daily. (Patient not taking: No sig reported)  ? [DISCONTINUED] predniSONE (DELTASONE) 20 MG tablet Take daily with food. Start with '60mg'$  (3 pills) x 2 days, then reduce to '40mg'$  (2 pills) x 2 days, then '20mg'$  (1 pill) x 3 days (Patient not taking: Reported on 09/08/2021)  ? ?No facility-administered encounter medications on file as of 09/08/2021.  ? ? ?Allergies (verified) ?Patient has no known allergies.  ? ?History: ?Past  Medical History:  ?Diagnosis Date  ? Asthma   ? GERD (gastroesophageal reflux disease)   ? Hypercholesteremia   ? Hyperlipidemia   ? Hypertension   ? Osteoporosis   ? ?Past Surgical History:  ?Procedure Laterality Date  ? ABDOMINAL HYSTERECTOMY    ? BREAST BIOPSY    ? COLONOSCOPY WITH PROPOFOL N/A 08/10/2019  ? Procedure: COLONOSCOPY WITH PROPOFOL;  Surgeon: Jonathon Bellows, MD;  Location: [facility] ENDOSCOPY;  Service: Gastroenterology;  Laterality: N/A;  ? ESOPHAGOGASTRODUODENOSCOPY (EGD) WITH PROPOFOL N/A 08/10/2019  ? Procedure: ESOPHAGOGASTRODUODENOSCOPY (EGD) WITH PROPOFOL;  Surgeon: Jonathon Bellows, MD;  Location: [facility] ENDOSCOPY;  Service: Gastroenterology;   Laterality: N/A;  ? EXTERNAL FIXATION LEG Left 04/07/2020  ?  EXTERNAL FIXATION LEG (Left Leg Lower)  ? EXTERNAL FIXATION LEG Left 04/07/2020  ? Procedure: EXTERNAL FIXATION LEG;  Surgeon: Shona Needles, MD;  Location: [place];  Service: Orthopedics;  Laterality: Left;  ? EXTERNAL FIXATION REMOVAL Left 04/10/2020  ? Procedure: REMOVAL EXTERNAL FIXATION LEG;  Surgeon: Shona Needles, MD;  Location: [place];  Service: Orthopedics;  Laterality: Left;  ? I & D EXTREMITY Left 04/07/2020  ? Procedure: IRRIGATION AND DEBRIDEMENT EXTREMITY;  Surgeon: Shona Needles, MD;  Location: [place];  Service: Orthopedics;  Laterality: Left;  ? I & D EXTREMITY Left 06/12/2020  ? Procedure: IRRIGATION AND DEBRIDEMENT EXTREMITY;  Surgeon: Shona Needles, MD;  Location: [place];  Service: Orthopedics;  Laterality: Left;  ? [place]  2014  ? TIBIA IM NAIL INSERTION Left 04/10/2020  ? Procedure: INTRAMEDULLARY (IM) NAIL TIBIAL;  Surgeon: Shona Needles, MD;  Location: [place];  Service: Orthopedics;  Laterality: Left;  ? ?No family history on file. ?Social History  ? ?Socioeconomic History  ? Marital status: Single  ?  Spouse name: Not on file  ? Number of children: 0  ? Years of education: Not on file  ? Highest education level: Not on file  ?Occupational History  ? Occupation: Retired  ?Tobacco Use  ? Smoking status: Never  ? Smokeless tobacco: Never  ? Tobacco comments:  ?  Quit 40years ago.   ?Vaping Use  ? Vaping Use: Never used  ?Substance and Sexual Activity  ? Alcohol use: Yes  ?  Comment: "rarely"   ? Drug use: Never  ? Sexual activity: Not Currently  ?Other Topics Concern  ? Not on file  ?Social History Narrative  ? ** Merged History Encounter **  ?    ? ?Social Determinants of Health  ? ?Financial Resource Strain: Not on file  ?Food Insecurity: No Food Insecurity  ? Worried About Charity fundraiser in the Last Year: Never true  ? Ran Out of Food in the Last Year: Never true  ?Transportation Needs: No Transportation Needs  ? Lack of  Transportation (Medical): No  ? Lack of Transportation (Non-Medical): No  ?Physical Activity: Sufficiently Active  ? Days of Exercise per Week: 4 days  ? Minutes of Exercise per Session: 60 min  ?Stress: No Stress Concern Present  ? Feeling of Stress : Not at all  ?Social Connections: Moderately Integrated  ? Frequency of Communication with Friends and Family: More than three times a week  ? Frequency of Social Gatherings with Friends and Family: More than three times a week  ? Attends Religious Services: More than 4 times per year  ? Active Member of Clubs or Organizations: Yes  ? Attends Archivist Meetings: More than 4 times per year  ?  Marital Status: Never married  ? ? ?Tobacco Counseling ?Counseling given: Not Answered ?Tobacco comments: Quit 40years ago.  ? ? ?Clinical Intake: ? ?Pre-visit preparation completed: No ? ?Pain : No/denies pain ?Pain Score: 0-No pain ? ?  ? ?Nutritional Status: BMI of 19-24  Normal ?Nutritional Risks: Nausea/ vomitting/ diarrhea ?Diabetes: No ? ?How often do you need to have someone help you when you read instructions, pamphlets, or other written materials from your doctor or pharmacy?: 1 - Never ? ?Diabetic?No  ? ?Interpreter Needed?: No ? ?  ? ? ?Activities of Daily Living ? ?  09/08/2021  ?  8:10 AM  ?In your present state of health, do you have any difficulty performing the following activities:  ?Hearing? 0  ?Vision? 1  ?Difficulty concentrating or making decisions? 0  ?Walking or climbing stairs? 0  ?Dressing or bathing? 0  ?Doing errands, shopping? 0  ? ? ?Patient Care Team: ?Olin Hauser, DO as PCP - General (Family Medicine) ?Parks Ranger Devonne Doughty, DO (Family Medicine) ? ?Indicate any recent Medical Services you may have received from other than [facility] providers in the past year (date may be approximate). ? ?  No hospitalization in the past 12 months.  ?Assessment:  ? This is a routine wellness examination for Rhianna. ? ?Hearing/Vision screen ?No  results found. ? ?Dietary issues and exercise activities discussed: ?Current Exercise Habits: Structured exercise class, Type of exercise: strength training/weights;walking, Time (Minutes): 60, Frequency (Times/Week):

## 2021-09-12 DIAGNOSIS — Z114 Encounter for screening for human immunodeficiency virus [HIV]: Secondary | ICD-10-CM | POA: Diagnosis not present

## 2021-09-12 DIAGNOSIS — R319 Hematuria, unspecified: Secondary | ICD-10-CM | POA: Diagnosis not present

## 2021-09-12 DIAGNOSIS — N898 Other specified noninflammatory disorders of vagina: Secondary | ICD-10-CM | POA: Diagnosis not present

## 2021-09-12 DIAGNOSIS — Z1159 Encounter for screening for other viral diseases: Secondary | ICD-10-CM | POA: Diagnosis not present

## 2021-09-12 DIAGNOSIS — Z118 Encounter for screening for other infectious and parasitic diseases: Secondary | ICD-10-CM | POA: Diagnosis not present

## 2021-09-12 DIAGNOSIS — R102 Pelvic and perineal pain: Secondary | ICD-10-CM | POA: Diagnosis not present

## 2021-09-12 DIAGNOSIS — B3731 Acute candidiasis of vulva and vagina: Secondary | ICD-10-CM | POA: Diagnosis not present

## 2021-09-12 DIAGNOSIS — Z113 Encounter for screening for infections with a predominantly sexual mode of transmission: Secondary | ICD-10-CM | POA: Diagnosis not present

## 2021-09-23 ENCOUNTER — Ambulatory Visit: Payer: Self-pay

## 2021-09-23 NOTE — Telephone Encounter (Signed)
?Chief Complaint: Diarrhea since sunday ?Symptoms: Diarrhea since Sunday.  ?Frequency: sunday ?Pertinent Negatives: Patient denies fever ?Disposition: '[x]'$ ED /'[]'$ Urgent Care (no appt availability in office) / '[]'$ Appointment(In office/virtual)/ '[]'$  [place] [facility]/ '[]'$ Home Care/ '[]'$ Refused Recommended Disposition /'[]'$ [place] [facility]/ '[]'$  Follow-up with PCP ?Additional Notes: Pt was seen recently for vaginal itching and odor. Pt also had some diarrhea at that time. Pt was tested and nothing was found. Pt was given medication which stopped the itching but not the odor. PT states that she also had blood in her urine when tested at [facility] and at the GYN office. ?Pt states that since Sunday she has had many very dark watery stools. (Stools may be dark due to Pepto bismol consumption.) Pt had a BM in her sleep this afternoon. Pt states that she is gassy, and nauseous. When she eats it goes right through her.  She rates her pain of 8/10. Pt would like call back from provider. She does not want to go to the ED. She is interested in a gastro referral.  ? ? ?Reason for Disposition ? [1] SEVERE abdominal pain (e.g., excruciating) AND [2] present > 1 hour ? ?Answer Assessment - Initial Assessment Questions ?1. DIARRHEA SEVERITY: "How bad is the diarrhea?" "How many more stools have you had in the past 24 hours than normal?"  ?  - NO DIARRHEA (SCALE 0) ?  - MILD (SCALE 1-3): Few loose or mushy BMs; increase of 1-3 stools over normal daily number of stools; mild increase in ostomy output. ?  -  MODERATE (SCALE 4-7): Increase of 4-6 stools daily over normal; moderate increase in ostomy output. ?* SEVERE (SCALE 8-10; OR 'WORST POSSIBLE'): Increase of 7 or more stools daily over normal; moderate increase in ostomy output; incontinence. ?    7/10 ?2. ONSET: "When did the diarrhea begin?"  ?    Sunday ?3. BM CONSISTENCY: "How loose or watery is the diarrhea?"  ?    Black water ?4. VOMITING: "Are you also vomiting?" If Yes, ask:  "How many times in the past 24 hours?"  ?    no ?5. ABDOMINAL PAIN: "Are you having any abdominal pain?" If Yes, ask: "What does it feel like?" (e.g., crampy, dull, intermittent, constant)  ?    crampy ?6. ABDOMINAL PAIN SEVERITY: If present, ask: "How bad is the pain?"  (e.g., Scale 1-10; mild, moderate, or severe) ?  - MILD (1-3): doesn't interfere with normal activities, abdomen soft and not tender to touch  ?  - MODERATE (4-7): interferes with normal activities or awakens from sleep, abdomen tender to touch  ?  - SEVERE (8-10): excruciating pain, doubled over, unable to do any normal activities   ?    8/10 ?7. ORAL INTAKE: If vomiting, "Have you been able to drink liquids?" "How much liquids have you had in the past 24 hours?" ?    yes ?8. HYDRATION: "Any signs of dehydration?" (e.g., dry mouth [not just dry lips], too weak to stand, dizziness, new weight loss) "When did you last urinate?" ?    no ?9. EXPOSURE: "Have you traveled to a foreign country recently?" "Have you been exposed to anyone with diarrhea?" "Could you have eaten any food that was spoiled?" ?    no ?10. ANTIBIOTIC USE: "Are you taking antibiotics now or have you taken antibiotics in the past 2 months?" ?      no ?11. OTHER SYMPTOMS: "Do you have any other symptoms?" (e.g., fever, blood in stool) ?  no ?12. PREGNANCY: "Is there any chance you are pregnant?" "When was your last menstrual period?" ?      na ? ?Protocols used: Diarrhea-A-AH ? ?

## 2021-09-23 NOTE — Telephone Encounter (Signed)
Last visit with me was for Annual Wellness. We did not address acute abdominal pain and dark stools diarrhea. This is a new complaint I have not evaluated in order to refer. ? ?Please schedule acute visit at our office or if urgent I am worried about her symptoms I agree sooner evaluation at Urgent Care or  if needed ED only if severe abdominal pain and cannot keep nutrition in due to diarrhea risk dehydration. ? ?Nobie Putnam, DO ?[facility] ?[facility] ?09/23/2021, 5:12 PM ? ?

## 2021-09-24 ENCOUNTER — Other Ambulatory Visit: Payer: Self-pay | Admitting: Family Medicine

## 2021-09-24 DIAGNOSIS — K5730 Diverticulosis of large intestine without perforation or abscess without bleeding: Secondary | ICD-10-CM | POA: Diagnosis not present

## 2021-09-24 DIAGNOSIS — R079 Chest pain, unspecified: Secondary | ICD-10-CM | POA: Diagnosis not present

## 2021-09-24 DIAGNOSIS — K429 Umbilical hernia without obstruction or gangrene: Secondary | ICD-10-CM | POA: Diagnosis not present

## 2021-09-24 DIAGNOSIS — Z8719 Personal history of other diseases of the digestive system: Secondary | ICD-10-CM | POA: Diagnosis not present

## 2021-09-24 DIAGNOSIS — J4531 Mild persistent asthma with (acute) exacerbation: Secondary | ICD-10-CM

## 2021-09-24 DIAGNOSIS — K219 Gastro-esophageal reflux disease without esophagitis: Secondary | ICD-10-CM | POA: Diagnosis not present

## 2021-09-24 DIAGNOSIS — K449 Diaphragmatic hernia without obstruction or gangrene: Secondary | ICD-10-CM | POA: Diagnosis not present

## 2021-09-24 DIAGNOSIS — I119 Hypertensive heart disease without heart failure: Secondary | ICD-10-CM | POA: Diagnosis not present

## 2021-09-24 DIAGNOSIS — I10 Essential (primary) hypertension: Secondary | ICD-10-CM

## 2021-09-24 DIAGNOSIS — R1013 Epigastric pain: Secondary | ICD-10-CM | POA: Diagnosis not present

## 2021-09-24 DIAGNOSIS — R9431 Abnormal electrocardiogram [ECG] [EKG]: Secondary | ICD-10-CM | POA: Diagnosis not present

## 2021-09-24 DIAGNOSIS — N281 Cyst of kidney, acquired: Secondary | ICD-10-CM | POA: Diagnosis not present

## 2021-09-24 DIAGNOSIS — R197 Diarrhea, unspecified: Secondary | ICD-10-CM | POA: Diagnosis not present

## 2021-09-24 DIAGNOSIS — Z87891 Personal history of nicotine dependence: Secondary | ICD-10-CM | POA: Diagnosis not present

## 2021-09-24 NOTE — Telephone Encounter (Signed)
Pt. Called back and states she is going to ED today. ?

## 2021-09-24 NOTE — Telephone Encounter (Signed)
Requested Prescriptions  ?Pending Prescriptions Disp Refills  ?? hydrochlorothiazide (HYDRODIURIL) 25 MG tablet [Pharmacy Med Name: HYDROCHLOROTHIAZIDE '25MG'$  TABLETS] 90 tablet 3  ?  Sig: TAKE 1 TABLET(25 MG) BY MOUTH DAILY  ?  ? Cardiovascular: Diuretics - Thiazide Passed - 09/24/2021 11:29 AM  ?  ?  Passed - Cr in normal range and within 180 days  ?  Creat  ?Date Value Ref Range Status  ?09/05/2021 1.01 0.50 - 1.05 mg/dL Final  ?   ?  ?  Passed - K in normal range and within 180 days  ?  Potassium  ?Date Value Ref Range Status  ?09/05/2021 3.7 3.5 - 5.3 mmol/L Final  ?   ?  ?  Passed - Na in normal range and within 180 days  ?  Sodium  ?Date Value Ref Range Status  ?09/05/2021 142 135 - 146 mmol/L Final  ?10/13/2018 139 137 - 147 Final  ?   ?  ?  Passed - Last BP in normal range  ?  BP Readings from Last 1 Encounters:  ?09/08/21 139/81  ?   ?  ?  Passed - Valid encounter within last 6 months  ?  Recent Outpatient Visits   ?      ? 2 weeks ago Annual physical exam  ? [place], DO  ? 1 month ago Mild persistent asthma with acute exacerbation  ? [place], DO  ? 4 months ago Other fatigue  ? [facility] [place], Coralie Keens, NP  ? 6 months ago Gross hematuria  ? [place], NP  ? 7 months ago Dysuria  ? [facility], Coralie Keens, NP  ?  ?  ? ?  ?  ?  ?? SYMBICORT 160-4.5 MCG/ACT inhaler [Pharmacy Med Name: SYMBICORT 160/4.5MCG (120 ORAL INH)] 3 each 3  ?  Sig: INHALE 2 PUFFS INTO THE LUNGS IN THE MORNING AND AT BEDTIME  ?  ? Pulmonology:  Combination Products Passed - 09/24/2021 11:29 AM  ?  ?  Passed - Valid encounter within last 12 months  ?  Recent Outpatient Visits   ?      ? 2 weeks ago Annual physical exam  ? [place], DO  ? 1 month ago Mild persistent asthma with acute exacerbation  ? [place], DO  ? 4 months ago Other fatigue  ? [facility], Coralie Keens, NP  ? 6 months ago Gross hematuria  ? [place], NP  ? 7 months ago Dysuria  ? [facility] [place], Coralie Keens, NP  ?  ?  ? ?  ?  ?  ? ? ?

## 2021-09-30 ENCOUNTER — Encounter: Payer: Self-pay | Admitting: Family Medicine

## 2021-09-30 ENCOUNTER — Ambulatory Visit (INDEPENDENT_AMBULATORY_CARE_PROVIDER_SITE_OTHER): Payer: Medicare PPO | Admitting: Family Medicine

## 2021-09-30 ENCOUNTER — Other Ambulatory Visit: Payer: Self-pay

## 2021-09-30 VITALS — BP 122/84 | HR 80 | Ht 64.5 in | Wt 174.0 lb

## 2021-09-30 DIAGNOSIS — K2900 Acute gastritis without bleeding: Secondary | ICD-10-CM | POA: Diagnosis not present

## 2021-09-30 DIAGNOSIS — R109 Unspecified abdominal pain: Secondary | ICD-10-CM

## 2021-09-30 DIAGNOSIS — A048 Other specified bacterial intestinal infections: Secondary | ICD-10-CM

## 2021-09-30 DIAGNOSIS — J4531 Mild persistent asthma with (acute) exacerbation: Secondary | ICD-10-CM

## 2021-09-30 MED ORDER — PANTOPRAZOLE SODIUM 40 MG PO TBEC: 40.0000 mg | DELAYED_RELEASE_TABLET | Freq: Two times a day (BID) | ORAL | 2 refills | Status: DC

## 2021-09-30 MED ORDER — DICYCLOMINE HCL 10 MG PO CAPS: 10.0000 mg | ORAL_CAPSULE | Freq: Three times a day (TID) | ORAL | 2 refills | Status: DC

## 2021-09-30 MED ORDER — SYMBICORT 160-4.5 MCG/ACT IN AERO: 2.0000 | INHALATION_SPRAY | Freq: Two times a day (BID) | RESPIRATORY_TRACT | 3 refills | Status: DC

## 2021-09-30 MED ORDER — METRONIDAZOLE 500 MG PO TABS: 500.0000 mg | ORAL_TABLET | Freq: Three times a day (TID) | ORAL | 0 refills | Status: DC

## 2021-09-30 MED ORDER — CLARITHROMYCIN 500 MG PO TABS: 500.0000 mg | ORAL_TABLET | Freq: Two times a day (BID) | ORAL | 0 refills | Status: DC

## 2021-09-30 NOTE — Patient Instructions (Addendum)
Thank you for coming to the office today. ? ?Start antibiotics as prescribed for 2 weeks ? ?Stop Famotidine ?Start Pantoprazole '40mg'$  twice a day for 2 weeks, then go down to 1 time daily in morning ? ?Referral to new GI Dr Lucilla Lame - sent. ? ?[facility]) ?[address] ?[address] ?[place], [place] [address] ?Main: [phone] ? ? ?Please schedule a Follow-up Appointment to: Return if symptoms worsen or fail to improve. ? ?If you have any other questions or concerns, please feel free to call the office or send a message through [place]. You may also schedule an earlier appointment if necessary. ? ?Additionally, you may be receiving a survey about your experience at our office within a few days to 1 week by e-mail or mail. We value your feedback. ? ?Nobie Putnam, DO ?[place] ?

## 2021-09-30 NOTE — Progress Notes (Signed)
? ?Subjective:  ? ? Patient ID: Kendra Allison, female    DOB: [birth year], 66 y.o.   MRN: [id number] ? ?Kendra Allison is a 66 y.o. female presenting on 09/30/2021 for Hospitalization Follow-up and Hiatal Hernia ? ? ?HPI ? ?ED FOLLOW-UP VISIT ? ?Hospital/Location: [facility] ?Date of ED Visit: 09/08/21 ? ?Reason for Presenting to ED: Abdominal Pain Epigastric ? ?FOLLOW-UP  ?- ED provider note and record have been reviewed ?- Patient presents today about 6 days after recent ED visit. Brief summary of recent course, patient had symptoms of  ? ?She reports prior to hospital she had vaginal discharge and itching, she was treated with diflucan after negative work up urinalysis ? ?Few days later she woke up on 5/2 with significant upper abdominal epigastric pain, she went to ED on  09/24/21, she described stool incontinence, diarrhea. ? ?She has described gastritis and H Pylori, seems similar to past. [facility] ED testing was with H Pylori antibody was negative. Urinalysis negative. Lipase negative. Chemistry panel. ? ?CT without obvious cause of her symptoms, see below. ? ?No NSAIDs, she rarely takes Celebrex and has been a while a few months since took it last. ? ?She was treated previously in 2021 for H Pylori and symptoms resolved at that time with antibiotics and triple therapy.; ? ?Previously followed by Dr Caro Laroche [place] has had past endoscopy EGD and colonoscopy 07/2019 at that time. ? ?- Today reports overall has still had persistent abdominal discomfort and requesting new GI referral since has not been scheduled yet. In past she did well with treatment for H Pylori ? ? ?I have reviewed the discharge medication list, and have reconciled the current and discharge medications today. ? ? ?Current Outpatient Medications:  ?  albuterol (VENTOLIN HFA) 108 (90 Base) MCG/ACT inhaler, Inhale 2 puffs into the lungs every 4 (four) hours as needed for wheezing or shortness of breath (cough)., Disp: 1 each, Rfl: 3 ?  amLODipine  (NORVASC) 10 MG tablet, Take 1 tablet (10 mg total) by mouth daily., Disp: 90 tablet, Rfl: 1 ?  azelastine (ASTELIN) 0.1 % nasal spray, Place 1-2 sprays into both nostrils 2 (two) times daily as needed for allergies. , Disp: , Rfl:  ?  celecoxib (CELEBREX) 200 MG capsule, Take 200 mg by mouth 2 (two) times daily as needed., Disp: , Rfl:  ?  clarithromycin (BIAXIN) 500 MG tablet, Take 1 tablet (500 mg total) by mouth 2 (two) times daily., Disp: 28 tablet, Rfl: 0 ?  dicyclomine (BENTYL) 10 MG capsule, Take 1 capsule (10 mg total) by mouth 3 (three) times daily before meals. As needed for abdominal pain cramping bloating, Disp: 30 capsule, Rfl: 2 ?  EPINEPHrine 0.3 mg/0.3 mL IJ SOAJ injection, Inject 0.3 mg into the muscle as needed for anaphylaxis (for allergy shots)., Disp: , Rfl:  ?  famotidine (PEPCID) 20 MG tablet, Take by mouth., Disp: , Rfl:  ?  fluticasone (FLONASE) 50 MCG/ACT nasal spray, Place 2 sprays into both nostrils daily as needed for allergies., Disp: , Rfl:  ?  hydrochlorothiazide (HYDRODIURIL) 25 MG tablet, TAKE 1 TABLET(25 MG) BY MOUTH DAILY, Disp: 90 tablet, Rfl: 3 ?  metroNIDAZOLE (FLAGYL) 500 MG tablet, Take 1 tablet (500 mg total) by mouth 3 (three) times daily. Do not drink alcohol while taking this medicine., Disp: 42 tablet, Rfl: 0 ?  montelukast (SINGULAIR) 10 MG tablet, TAKE 1 TABLET(10 MG) BY MOUTH AT BEDTIME, Disp: 90 tablet, Rfl: 1 ?  Multiple Vitamin (MULTIVITAMIN WITH  MINERALS) TABS tablet, Take 1 tablet by mouth daily. One-A-Day Multivitamin, Disp: , Rfl:  ?  pantoprazole (PROTONIX) 40 MG tablet, Take 1 tablet (40 mg total) by mouth 2 (two) times daily before a meal., Disp: 60 tablet, Rfl: 2 ?  risedronate (ACTONEL) 35 MG tablet, Take by mouth., Disp: , Rfl:  ?  rosuvastatin (CRESTOR) 10 MG tablet, Take 1 tablet (10 mg total) by mouth at bedtime., Disp: 90 tablet, Rfl: 3 ?  SYMBICORT 160-4.5 MCG/ACT inhaler, Inhale 2 puffs into the lungs in the morning and at bedtime., Disp: 3 each,  Rfl: 3 ?  zolpidem (AMBIEN CR) 6.25 MG CR tablet, Take 1 tablet (6.25 mg total) by mouth at bedtime as needed for sleep., Disp: 30 tablet, Rfl: 2 ? ?------------------------------------------------------------------------- ?Social History  ? ?Tobacco Use  ? Smoking status: Never  ? Smokeless tobacco: Never  ? Tobacco comments:  ?  Quit 40years ago.   ?Vaping Use  ? Vaping Use: Never used  ?Substance Use Topics  ? Alcohol use: Yes  ?  Comment: "rarely"   ? Drug use: Never  ? ? ?Review of Systems ?Per HPI unless specifically indicated above ? ?   ?Objective:  ?  ?BP 122/84   Pulse 80   Ht 5' 4.5" (1.638 m)   Wt 174 lb (78.9 kg)   SpO2 98%   BMI 29.41 kg/m?   ?Wt Readings from Last 3 Encounters:  ?09/30/21 174 lb (78.9 kg)  ?09/08/21 176 lb 6.4 oz (80 kg)  ?09/08/21 176 lb 6.4 oz (80 kg)  ?  ?Physical Exam ?Vitals and nursing note reviewed.  ?Constitutional:   ?   General: She is not in acute distress. ?   Appearance: She is well-developed. She is not diaphoretic.  ?   Comments: Well-appearing, comfortable, cooperative  ?HENT:  ?   Head: Normocephalic and atraumatic.  ?Eyes:  ?   General:     ?   Right eye: No discharge.     ?   Left eye: No discharge.  ?   Conjunctiva/sclera: Conjunctivae normal.  ?Neck:  ?   Thyroid: No thyromegaly.  ?Cardiovascular:  ?   Rate and Rhythm: Normal rate and regular rhythm.  ?   Heart sounds: Normal heart sounds. No murmur heard. ?Pulmonary:  ?   Effort: Pulmonary effort is normal. No respiratory distress.  ?   Breath sounds: Normal breath sounds. No wheezing or rales.  ?Abdominal:  ?   General: Bowel sounds are normal. There is no distension.  ?   Palpations: There is no mass.  ?   Tenderness: There is abdominal tenderness (epigastric).  ?   Hernia: No hernia is present.  ?Musculoskeletal:     ?   General: Normal range of motion.  ?   Cervical back: Normal range of motion and neck supple.  ?Lymphadenopathy:  ?   Cervical: No cervical adenopathy.  ?Skin: ?   General: Skin is warm  and dry.  ?   Findings: No erythema or rash.  ?Neurological:  ?   Mental Status: She is alert and oriented to person, place, and time.  ?Psychiatric:     ?   Behavior: Behavior normal.  ?   Comments: Well groomed, good eye contact, normal speech and thoughts  ? ? ?I have personally reviewed the radiology report from 09/24/21 on CT Abdomen Pelvis. ? ?CT abdomen pelvis with contrast ? ?Anatomical Region Laterality Modality  ?Abdomen -- Computed Tomography  ?Pelvis -- --  ? ?  Narrative ? ?Examination: CT scan of abdomen and pelvis with only intravenous contrast.  ? ?Indication: Epigastric pain, R10.13 Epigastric pain. IV Contrast Only.  ? ?Contrast: 100 cc Isovue-300 intravenously.  ? ?Comparison: None.  ? ?Technique: Contiguous images from the domes of the diaphragm to pubic  ?symphysis with IV contrast were obtained.  ? ?Note: Dose reduction was obtained with Automatic Exposure Control (AEC) or,  ?if AEC could not be utilized, by manual adjustment of the mA and/or kV  ?according to patient size.  ? ?Findings:  ? ?Lung bases: Small hiatal hernia is noted. Nonspecific mild thickening of  ?the distal esophagus may or may not be due to reflux esophagitis. Mild  ?dependent atelectatic changes noted. Heart size is mildly enlarged. No  ?pericardial effusion.  ? ?Abdomen: Small low-density lesion in the subcapsular right hepatic lobe on  ?series 2 image 17-18 measuring 6 mm has attenuation value of 63 Hounsfield  ?units. Due to small size the attenuation value is not reliable. 4.5 x 5.3 x  ?5.2 cm water attenuation mass at the upper pole of the left kidney, most  ?consistent with a simple renal cyst. Otherwise kidneys are unremarkable.  ?Scans of the gallbladder, spleen, both adrenal glands, and the pancreas are  ?unremarkable. There is no evidence for retroperitoneal adenopathy. Mild  ?atherosclerosis.  ? ?GI tract, mesentery, omentum, abdominal wall: Tiny fat-containing umbilical  ?hernia noted. Sigmoid diverticula noted  without diverticulitis. Otherwise  ?the visualized bowel loops are unremarkable. Appendix is normal. No  ?mesenteric fat stranding. No pneumatosis or pneumoperitoneum. No intestinal  ?obstruction or ileus.  ? ?Pelvis:

## 2021-11-06 DIAGNOSIS — M65872 Other synovitis and tenosynovitis, left ankle and foot: Secondary | ICD-10-CM | POA: Diagnosis not present

## 2021-11-26 ENCOUNTER — Other Ambulatory Visit: Payer: Self-pay | Admitting: Family Medicine

## 2021-11-26 DIAGNOSIS — R6881 Early satiety: Secondary | ICD-10-CM | POA: Diagnosis not present

## 2021-11-26 DIAGNOSIS — I10 Essential (primary) hypertension: Secondary | ICD-10-CM

## 2021-11-26 DIAGNOSIS — R194 Change in bowel habit: Secondary | ICD-10-CM | POA: Diagnosis not present

## 2021-11-26 DIAGNOSIS — R140 Abdominal distension (gaseous): Secondary | ICD-10-CM | POA: Diagnosis not present

## 2021-11-26 DIAGNOSIS — R12 Heartburn: Secondary | ICD-10-CM | POA: Diagnosis not present

## 2021-11-27 NOTE — Telephone Encounter (Signed)
Requested Prescriptions  Pending Prescriptions Disp Refills  . amLODipine (NORVASC) 10 MG tablet [Pharmacy Med Name: AMLODIPINE BESYLATE 10 MG Tablet] 90 tablet 1    Sig: TAKE 1 TABLET EVERY DAY     Cardiovascular: Calcium Channel Blockers 2 Passed - 11/26/2021  5:08 PM      Passed - Last BP in normal range    BP Readings from Last 1 Encounters:  09/30/21 122/84         Passed - Last Heart Rate in normal range    Pulse Readings from Last 1 Encounters:  09/30/21 80         Passed - Valid encounter within last 6 months    Recent Outpatient Visits          1 month ago Acute gastritis, presence of bleeding unspecified, unspecified gastritis type   [place], DO   2 months ago Annual physical exam   [place], DO   3 months ago Mild persistent asthma with acute exacerbation   [place], DO   7 months ago Other fatigue   [facility] [place], Coralie Keens, NP   8 months ago Gross hematuria   [facility] [place], Coralie Keens, NP

## 2021-12-03 IMAGING — RF DG C-ARM 1-60 MIN
1 series · 9 of 9 positions shown · non-contrast
Comparison: X-ray from same day

CLINICAL DATA: External fixator placement

EXAM:
DG C-ARM 1-60 MIN
FLUOROSCOPY TIME:  Fluoroscopy Time:  33 seconds
Radiation Exposure Index (if provided by the fluoroscopic device):
0.96 mGy
Number of Acquired Spot Images: 9

[Series 1: run · 9 of 9 slices shown]
[im 1/9]
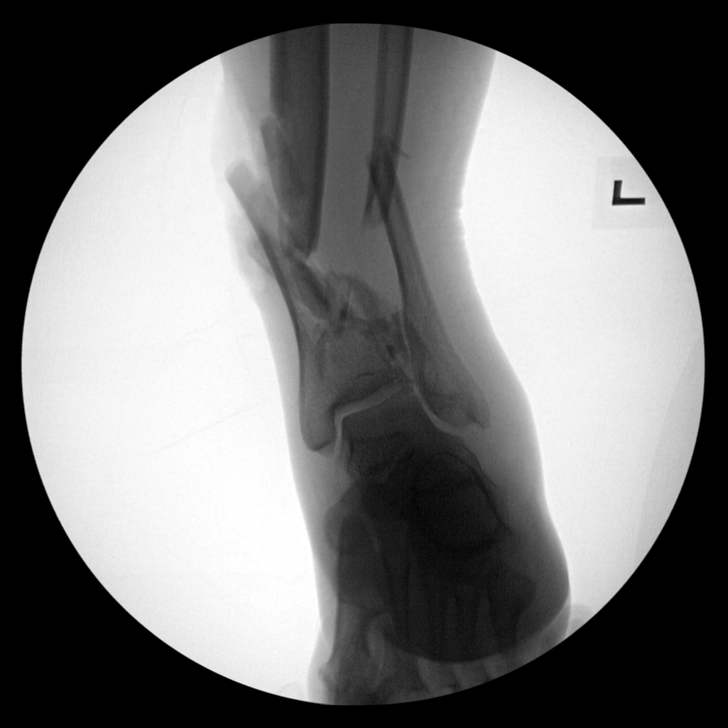
[im 2/9]
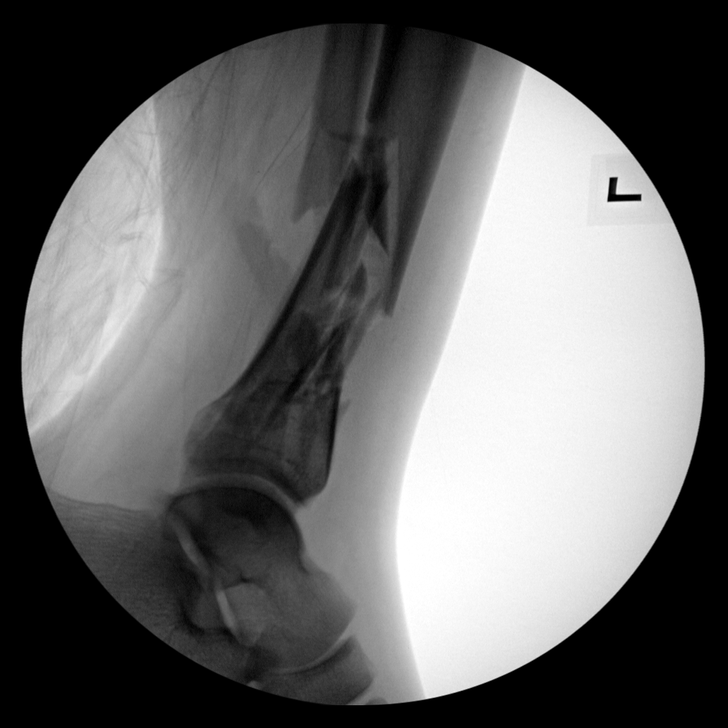
[im 3/9]
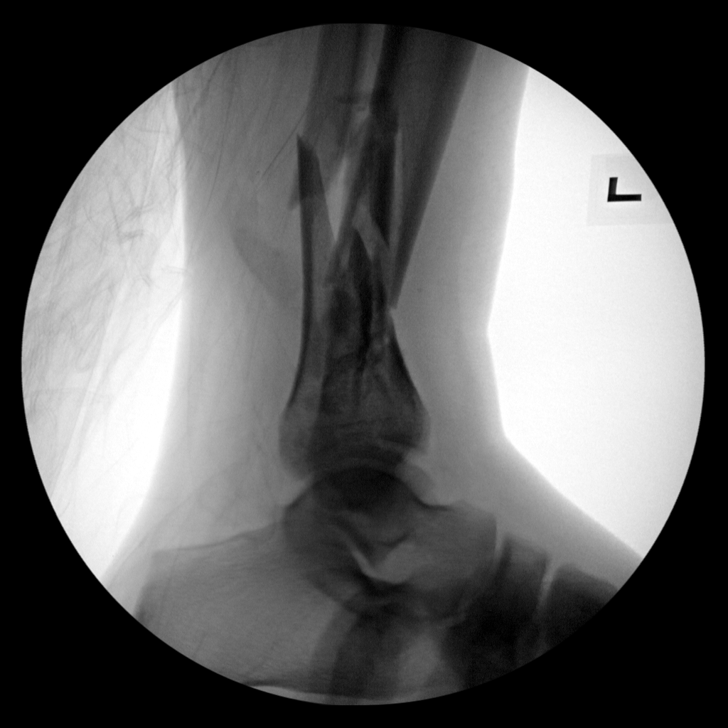
[im 4/9]
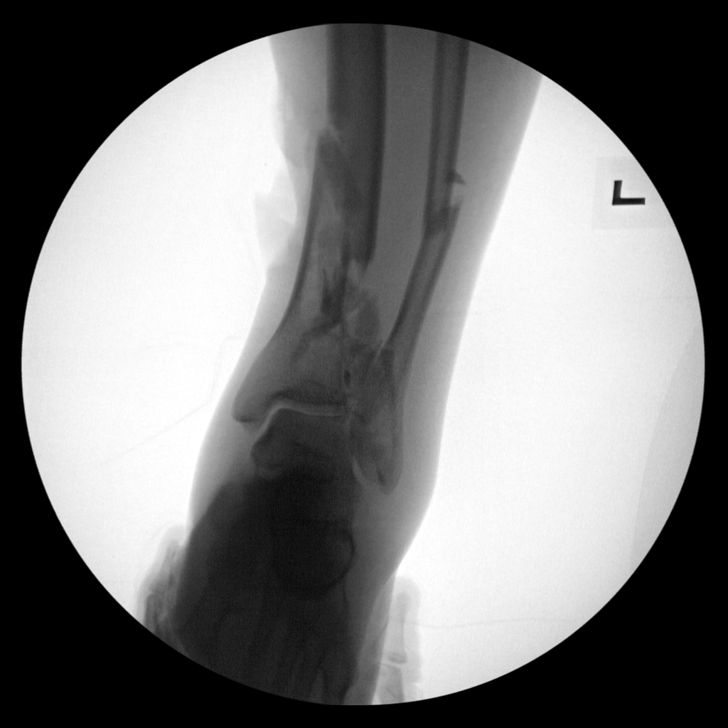
[im 5/9]
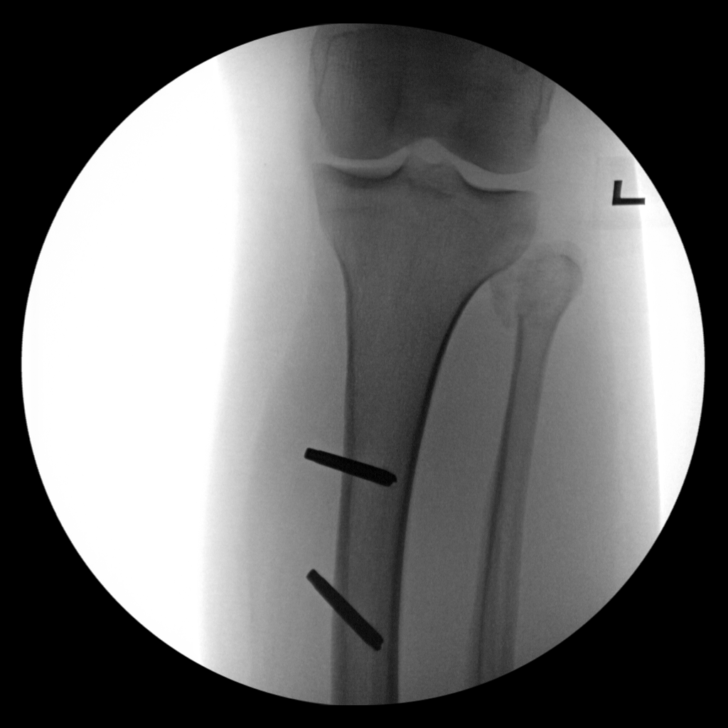
[im 6/9]
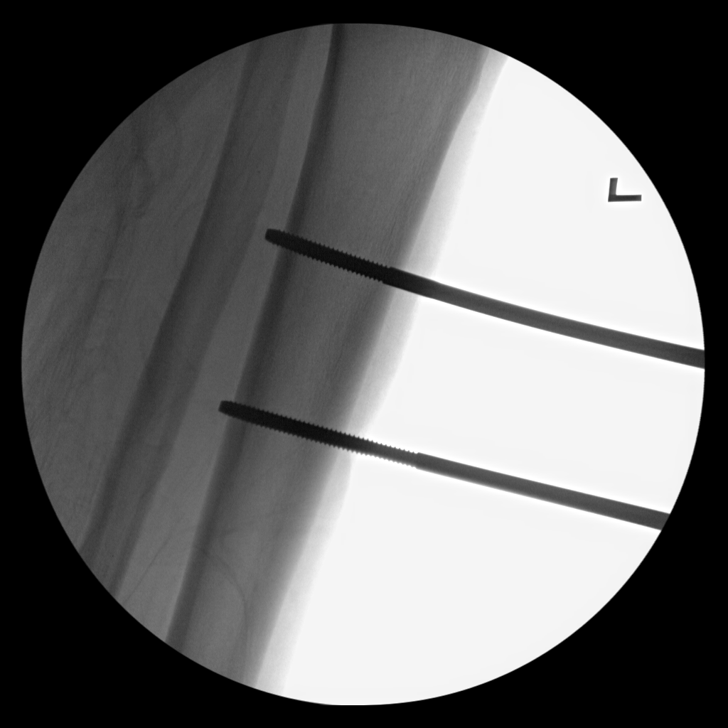
[im 7/9]
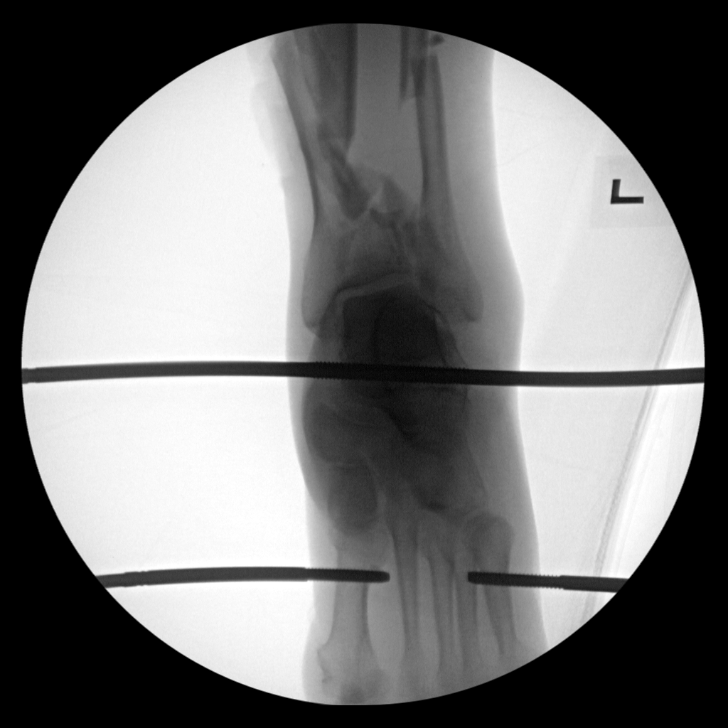
[im 8/9]
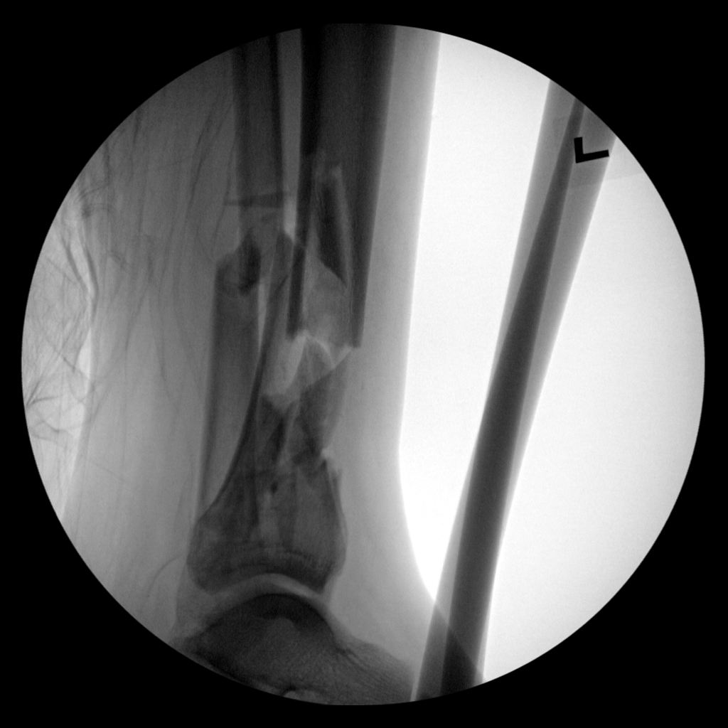
[im 9/9]
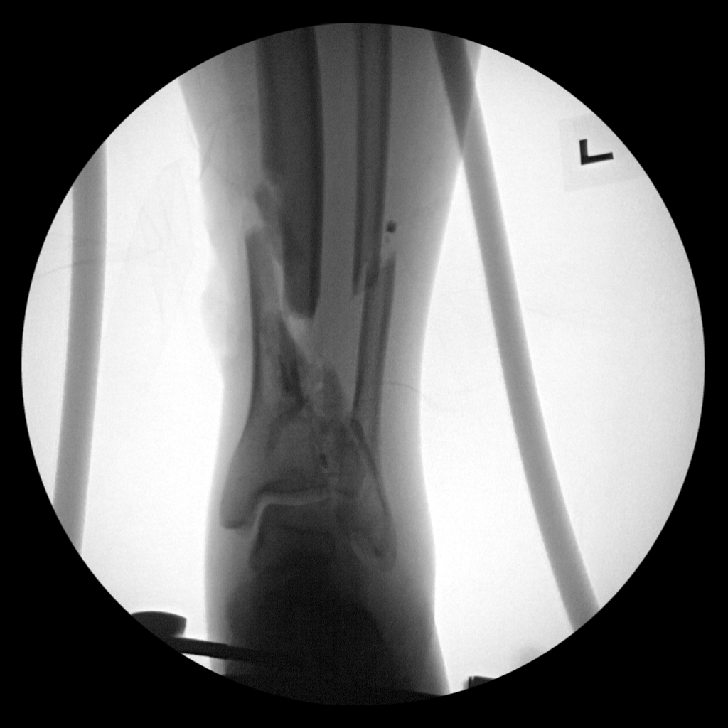

[9 of 9 positions shown; findings below may reference images not displayed]

FINDINGS: Again noted are highly comminuted fractures of the distal tibia and
fibula. The patient has undergone external fixation with somewhat
improved osseous alignment. There are expected postsurgical changes.
IMPRESSION: Improved osseous alignment status post external fixation.

## 2021-12-03 IMAGING — DX DG ANKLE COMPLETE 3+V*L*
2 series · 2 of 2 positions shown · non-contrast
Comparison: None.

CLINICAL DATA: 64-year-old female with fall and trauma to the left
lower extremity.

EXAM:
LEFT TIBIA AND FIBULA - 2 VIEW; LEFT ANKLE COMPLETE - 3+ VIEW; LEFT
OS CALCIS - 2+ VIEW

[tibia lat]
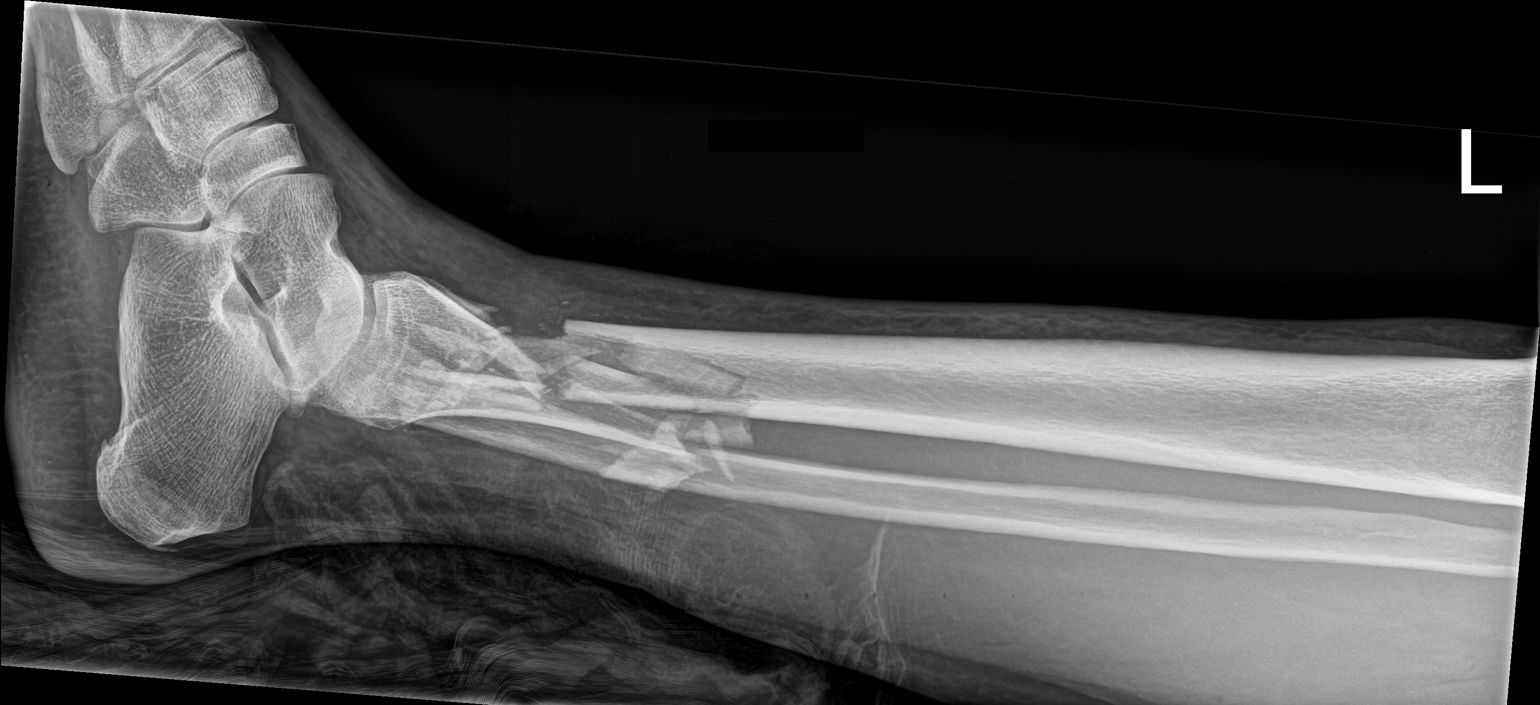

[tibia ap]
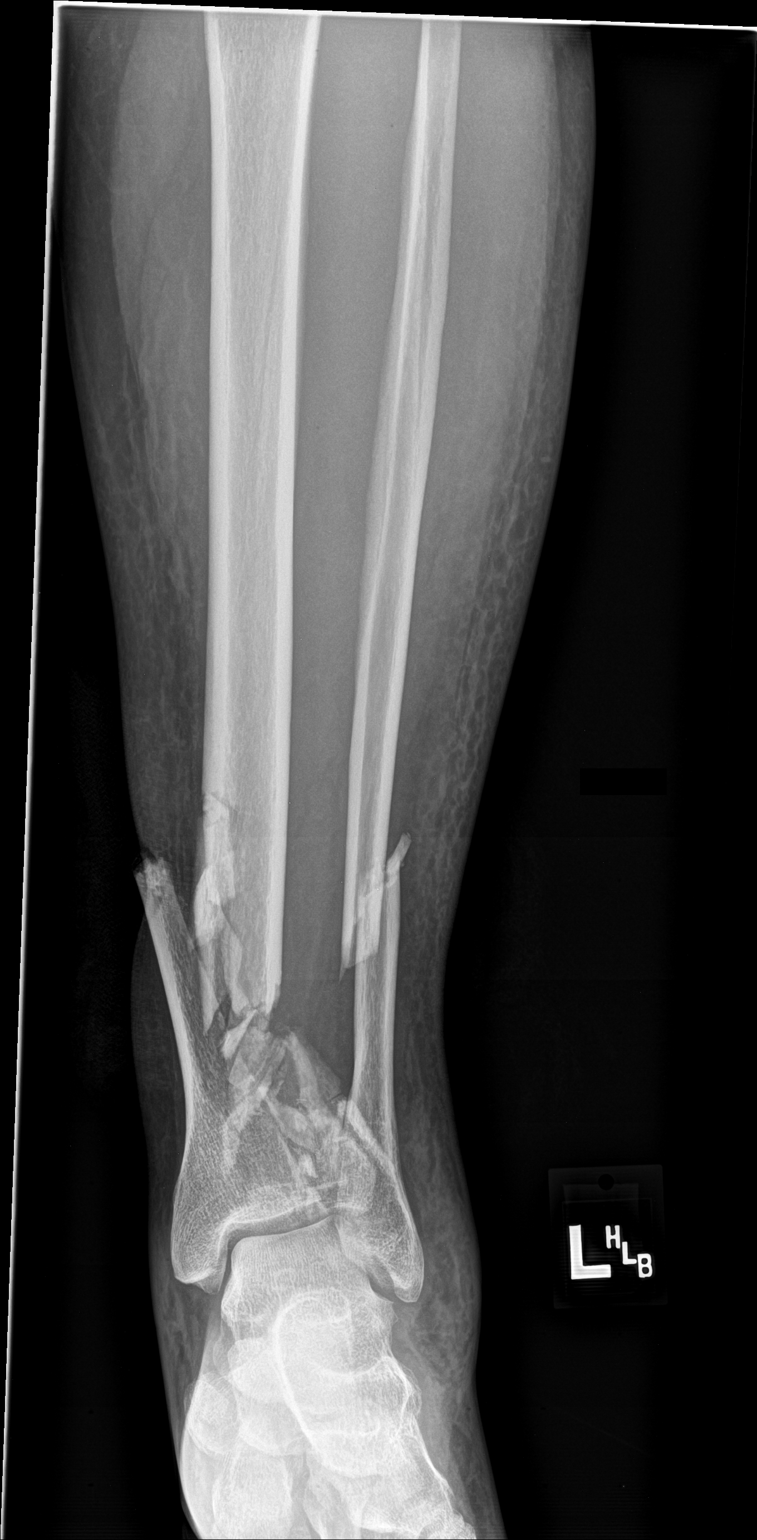

[2 of 2 positions shown; findings below may reference images not displayed]

FINDINGS: There is a mildly displaced comminuted fracture of the fibular head
and neck.

There is comminuted and displaced fracture of the distal tibial
diaphysis with lateral displacement of the distal fracture fragment.
The proximal end of the distal fracture fragment appears to extend
outside of the skin and represent an open fracture. Clinical
correlation is recommended. There is also displaced fracture of the
distal fibular diaphysis with approximately 2 cm overlap. Linear
cortical density along the medial aspect of the posterior calcaneus
may represent a mildly displaced cortical fracture.

There is no dislocation. There is diffuse soft tissue edema.
IMPRESSION: 1. Comminuted and displaced open fracture of the distal tibia as
well as comminuted fractures of the fibular head/neck and distal
fibular diaphysis.
2. Possible mildly displaced cortical fracture of the medial
posterior calcaneus.
3. Diffuse soft tissue edema.

## 2021-12-03 IMAGING — DX DG TIBIA/FIBULA 2V*L*
4 series · 4 of 4 positions shown · non-contrast
Comparison: None.

CLINICAL DATA: 64-year-old female with fall and trauma to the left
lower extremity.

EXAM:
LEFT TIBIA AND FIBULA - 2 VIEW; LEFT ANKLE COMPLETE - 3+ VIEW; LEFT
OS CALCIS - 2+ VIEW

[tibia ap (1 of 2)]
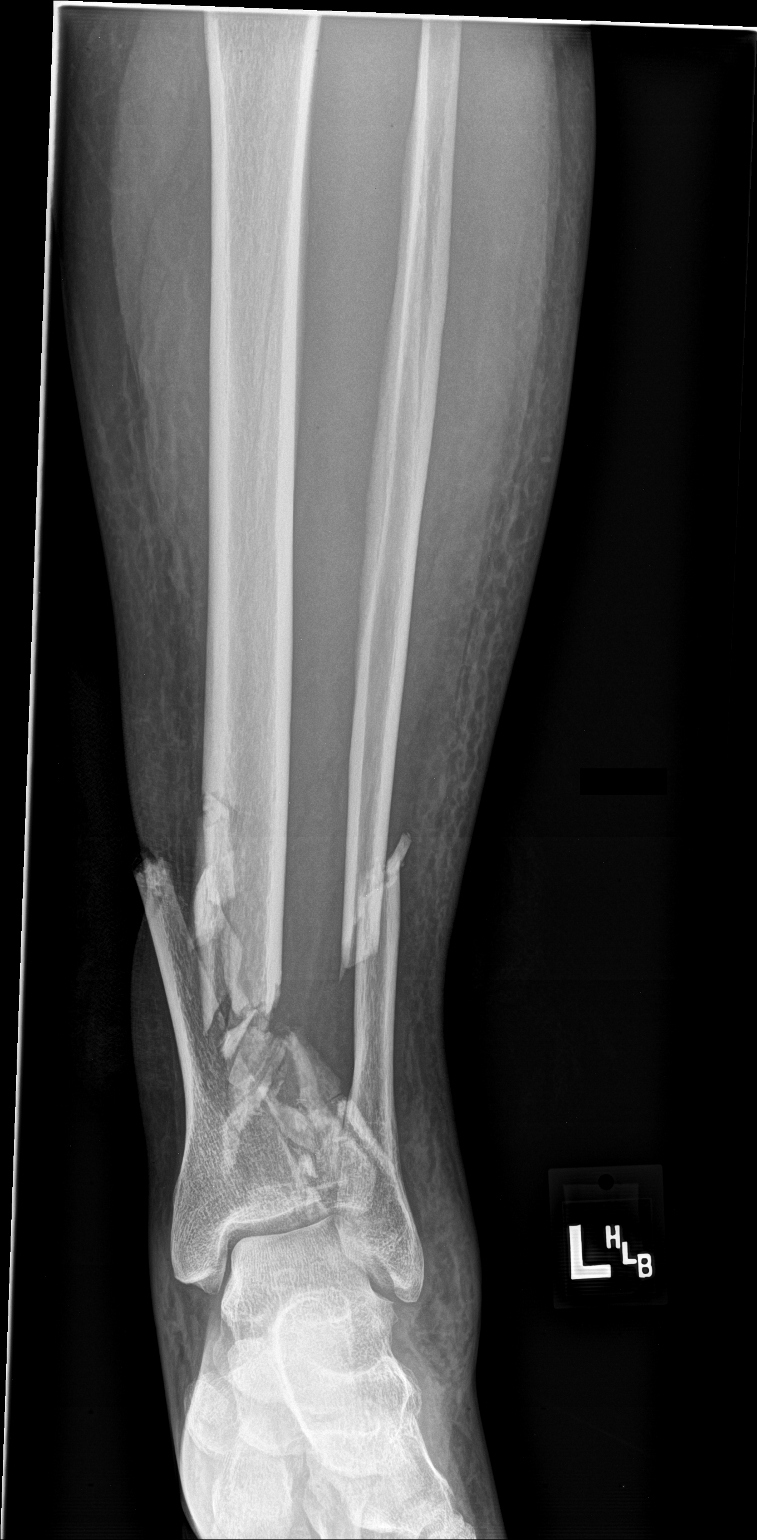

[tibia ap (2 of 2)]
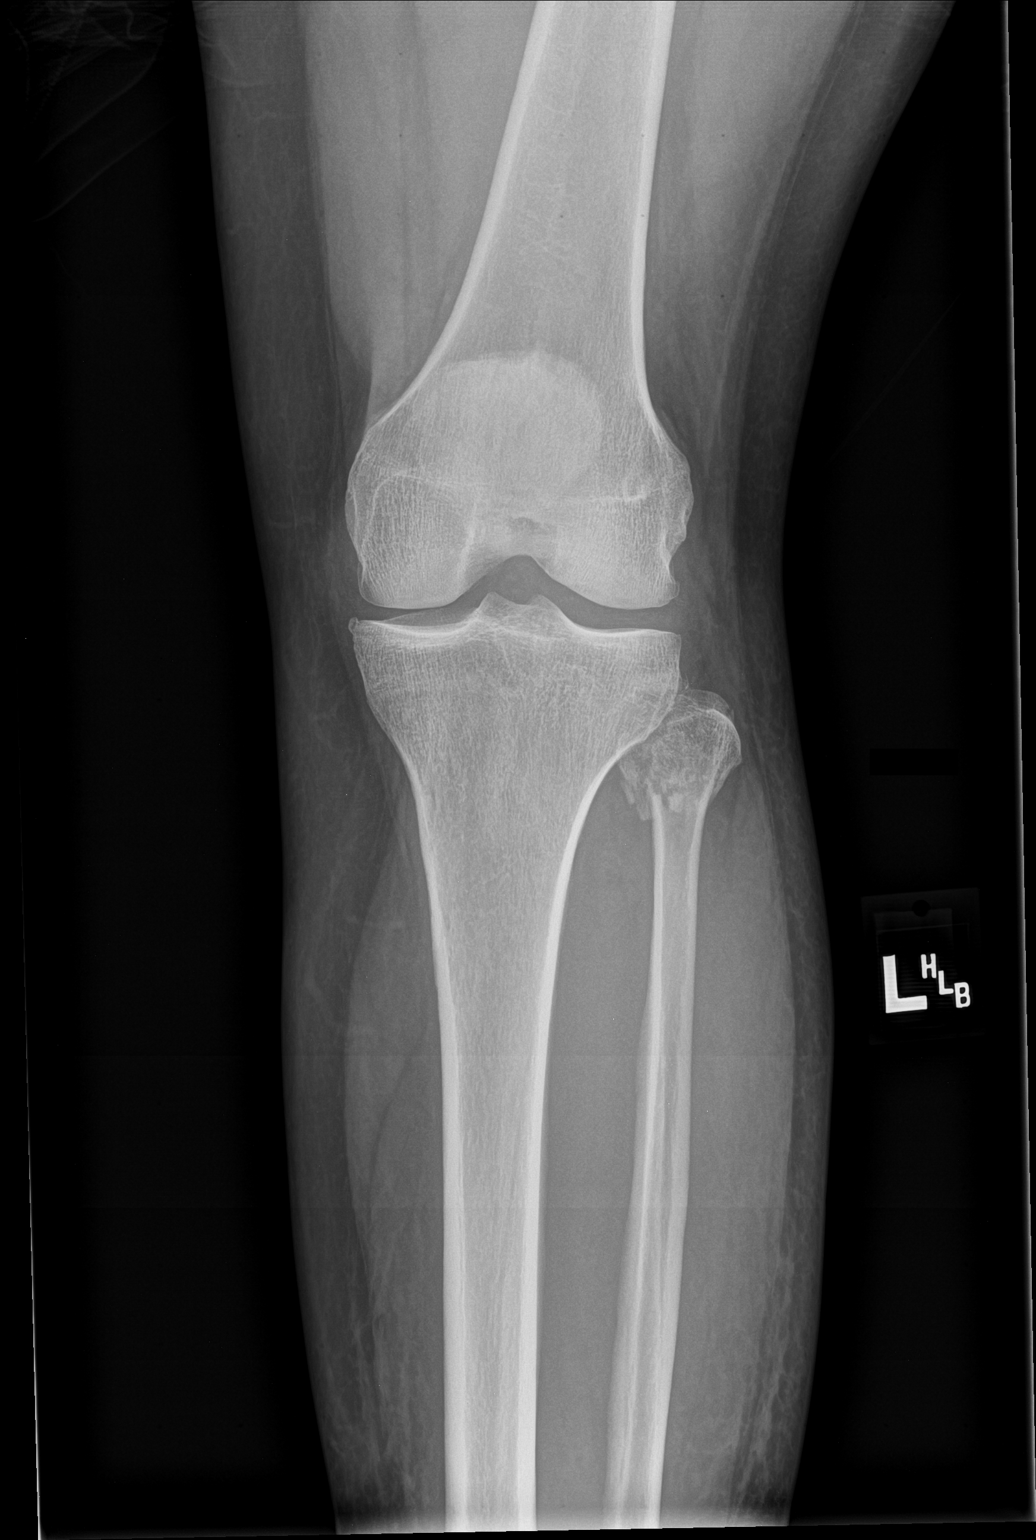

[tibia lat (1 of 2)]
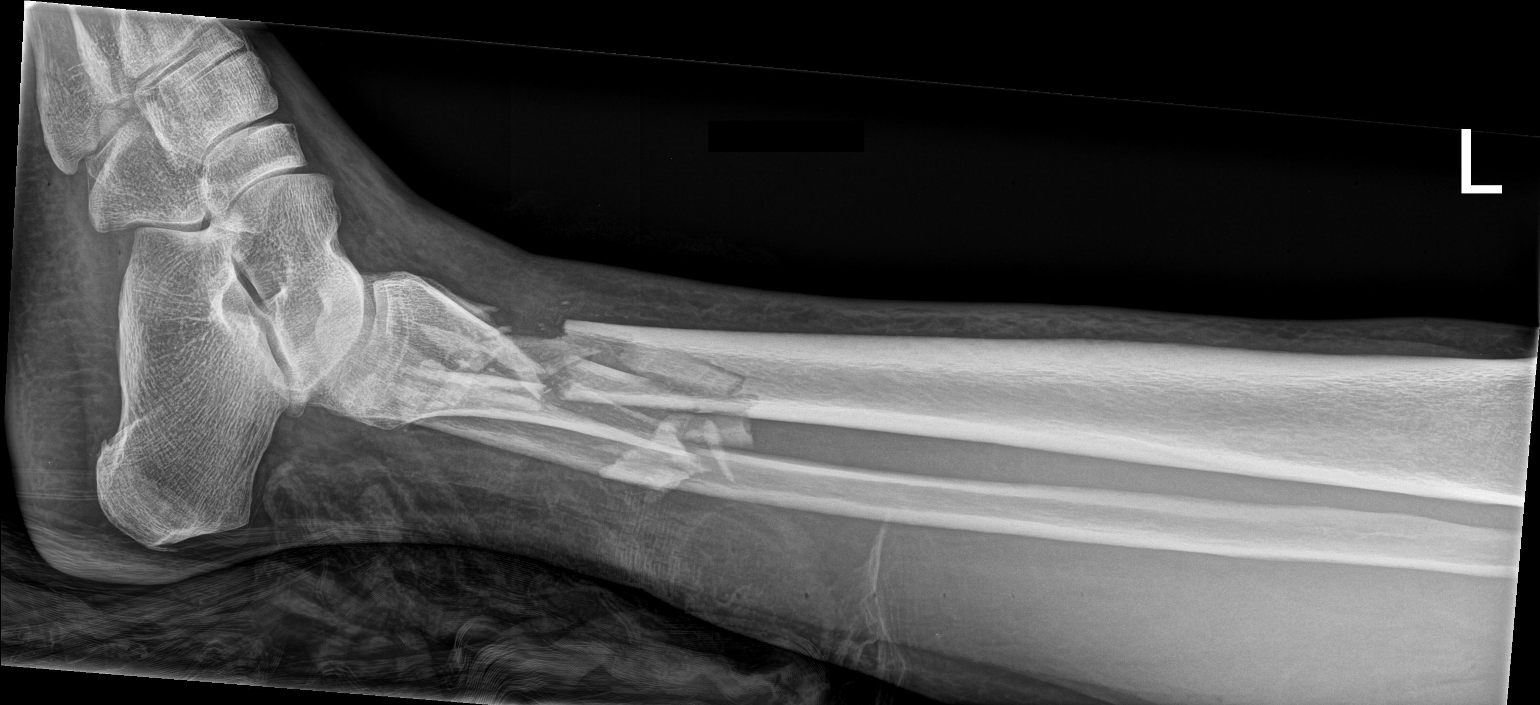

[tibia lat (2 of 2)]
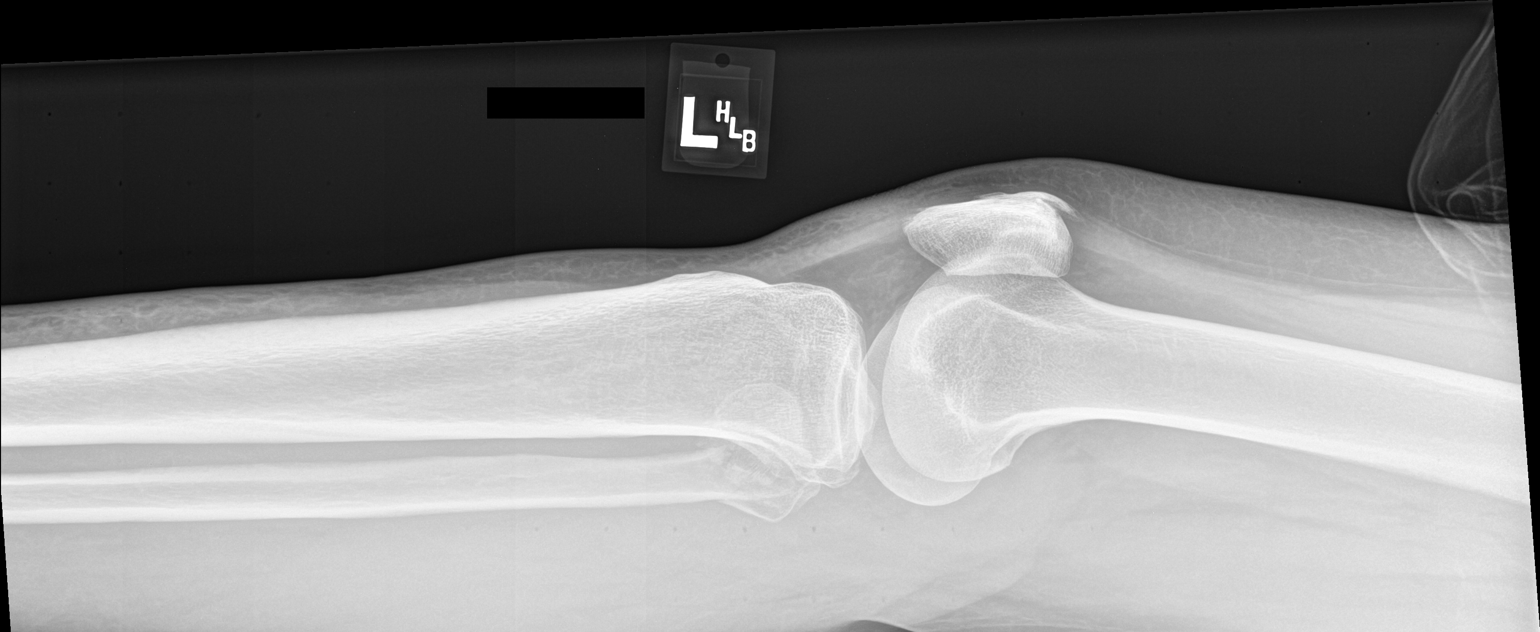

[4 of 4 positions shown; findings below may reference images not displayed]

FINDINGS: There is a mildly displaced comminuted fracture of the fibular head
and neck.

There is comminuted and displaced fracture of the distal tibial
diaphysis with lateral displacement of the distal fracture fragment.
The proximal end of the distal fracture fragment appears to extend
outside of the skin and represent an open fracture. Clinical
correlation is recommended. There is also displaced fracture of the
distal fibular diaphysis with approximately 2 cm overlap. Linear
cortical density along the medial aspect of the posterior calcaneus
may represent a mildly displaced cortical fracture.

There is no dislocation. There is diffuse soft tissue edema.
IMPRESSION: 1. Comminuted and displaced open fracture of the distal tibia as
well as comminuted fractures of the fibular head/neck and distal
fibular diaphysis.
2. Possible mildly displaced cortical fracture of the medial
posterior calcaneus.
3. Diffuse soft tissue edema.

## 2021-12-22 ENCOUNTER — Other Ambulatory Visit (HOSPITAL_COMMUNITY): Payer: Self-pay

## 2021-12-22 ENCOUNTER — Other Ambulatory Visit: Payer: Self-pay | Admitting: Family Medicine

## 2021-12-22 MED ORDER — FLUTICASONE PROPIONATE 50 MCG/ACT NA SUSP
2.0000 | Freq: Every day | NASAL | 5 refills | Status: DC | PRN
  Filled 2021-12-22: qty 16, 30d supply, fill #0

## 2021-12-22 NOTE — Telephone Encounter (Unsigned)
Copied from [place] [id number]. Topic: General - Other >> Dec 22, 2021  3:07 PM Everette C wrote: Reason for CRM: Medication Refill - Medication: Rx #: [id number]  fluticasone (FLONASE) 50 MCG/ACT nasal spray [[id number]]   Has the patient contacted their pharmacy? Yes.  The patient selected the incorrect pharmacy in error (Agent: If no, request that the patient contact the pharmacy for the refill. If patient does not wish to contact the pharmacy document the reason why and proceed with request.) (Agent: If yes, when and what did the pharmacy advise?)  Preferred Pharmacy (with phone number or street name): [facility], [place] [address] Phone: [phone] Fax: [phone] Hours: Not open 24 hours  Has the patient been seen for an appointment in the last year OR does the patient have an upcoming appointment? Yes.    Agent: Please be advised that RX refills may take up to 3 business days. We ask that you follow-up with your pharmacy.

## 2021-12-23 ENCOUNTER — Ambulatory Visit: Payer: Self-pay

## 2021-12-23 MED ORDER — FLUTICASONE PROPIONATE 50 MCG/ACT NA SUSP: 2.0000 | Freq: Every day | NASAL | 5 refills | Status: AC | PRN

## 2021-12-23 NOTE — Telephone Encounter (Signed)
Pt calling to follow up on refill. Refill was sent to [facility]. Refill need to go to  [facility].

## 2021-12-23 NOTE — Telephone Encounter (Signed)
Requested Prescriptions  Pending Prescriptions Disp Refills  . fluticasone (FLONASE) 50 MCG/ACT nasal spray 16 g 5    Sig: Place 2 sprays into both nostrils daily as needed for allergies.     Ear, Nose, and Throat: Nasal Preparations - Corticosteroids Passed - 12/22/2021  3:26 PM      Passed - Valid encounter within last 12 months    Recent Outpatient Visits          2 months ago Acute gastritis, presence of bleeding unspecified, unspecified gastritis type   [place], DO   3 months ago Annual physical exam   [place], DO   4 months ago Mild persistent asthma with acute exacerbation   [place], DO   7 months ago Other fatigue   [facility] [place], Coralie Keens, NP   9 months ago Gross hematuria   [facility] [place], Coralie Keens, [place]

## 2021-12-23 NOTE — Telephone Encounter (Signed)
  Chief Complaint: Medication refill sent to incorrect pharmacy Symptoms:  Frequency:  Pertinent Negatives: Patient denies  Disposition: '[]'$ ED /'[]'$ Urgent Care (no appt availability in office) / '[]'$ Appointment(In office/virtual)/ '[]'$  [facility]/ '[]'$ Home Care/ '[]'$ Refused Recommended Disposition /'[]'$  Mobile Bus/ '[x]'$  Follow-up with PCP Additional Notes: Refill sent to [facility] . Answer Assessment - Initial Assessment Questions 1. DRUG NAME: "What medicine do you need to have refilled?"     Flonase 2. REFILLS REMAINING: "How many refills are remaining?" (Note: The label on the medicine or pill bottle will show how many refills are remaining. If there are no refills remaining, then a renewal may be needed.)     0 3. EXPIRATION DATE: "What is the expiration date?" (Note: The label states when the prescription will expire, and thus can no longer be refilled.)     0 4. PRESCRIBING HCP: "Who prescribed it?" Reason: If prescribed by specialist, call should be referred to that group.     Dr. Parks Ranger 5. SYMPTOMS: "Do you have any symptoms?"      6. PREGNANCY: "Is there any chance that you are pregnant?" "When was your last menstrual period?"  Protocols used: Medication Refill and Renewal Call-A-AH

## 2021-12-25 ENCOUNTER — Other Ambulatory Visit (HOSPITAL_COMMUNITY): Payer: Self-pay

## 2022-02-10 ENCOUNTER — Ambulatory Visit: Payer: Medicare PPO | Admitting: Gastroenterology

## 2022-02-10 DIAGNOSIS — K648 Other hemorrhoids: Secondary | ICD-10-CM | POA: Diagnosis not present

## 2022-02-10 DIAGNOSIS — K449 Diaphragmatic hernia without obstruction or gangrene: Secondary | ICD-10-CM | POA: Diagnosis not present

## 2022-02-10 DIAGNOSIS — K2950 Unspecified chronic gastritis without bleeding: Secondary | ICD-10-CM | POA: Diagnosis not present

## 2022-02-10 DIAGNOSIS — R1013 Epigastric pain: Secondary | ICD-10-CM | POA: Diagnosis not present

## 2022-02-10 DIAGNOSIS — K5730 Diverticulosis of large intestine without perforation or abscess without bleeding: Secondary | ICD-10-CM | POA: Diagnosis not present

## 2022-02-10 DIAGNOSIS — K2100 Gastro-esophageal reflux disease with esophagitis, without bleeding: Secondary | ICD-10-CM | POA: Diagnosis not present

## 2022-02-10 DIAGNOSIS — J45909 Unspecified asthma, uncomplicated: Secondary | ICD-10-CM | POA: Diagnosis not present

## 2022-02-10 DIAGNOSIS — K2960 Other gastritis without bleeding: Secondary | ICD-10-CM | POA: Diagnosis not present

## 2022-02-10 DIAGNOSIS — K2289 Other specified disease of esophagus: Secondary | ICD-10-CM | POA: Diagnosis not present

## 2022-02-10 DIAGNOSIS — Z86010 Personal history of colonic polyps: Secondary | ICD-10-CM | POA: Diagnosis not present

## 2022-02-10 DIAGNOSIS — K3189 Other diseases of stomach and duodenum: Secondary | ICD-10-CM | POA: Diagnosis not present

## 2022-02-10 DIAGNOSIS — K5900 Constipation, unspecified: Secondary | ICD-10-CM | POA: Diagnosis not present

## 2022-02-10 DIAGNOSIS — K640 First degree hemorrhoids: Secondary | ICD-10-CM | POA: Diagnosis not present

## 2022-02-10 DIAGNOSIS — R197 Diarrhea, unspecified: Secondary | ICD-10-CM | POA: Diagnosis not present

## 2022-03-05 ENCOUNTER — Telehealth: Payer: Self-pay

## 2022-03-05 DIAGNOSIS — Z1231 Encounter for screening mammogram for malignant neoplasm of breast: Secondary | ICD-10-CM

## 2022-03-05 NOTE — Telephone Encounter (Signed)
Copied from [facility] [id number]. Topic: General - Other >> Mar 05, 2022  9:34 AM Everette C wrote: Reason for CRM: The patient has called to request orders for their mammogram   Please contact the patient further when possible

## 2022-03-05 NOTE — Telephone Encounter (Signed)
Please notify patient  For Mammogram screening for breast cancer   Call the [facility] below anytime to schedule your own appointment now that order has been placed.  [facility] at [facility] [address] [place], [place] [address] Phone: [phone]  Nobie Putnam, [facility] 03/05/2022, 10:46 AM

## 2022-03-09 ENCOUNTER — Other Ambulatory Visit: Payer: Self-pay | Admitting: Family Medicine

## 2022-03-09 DIAGNOSIS — J9801 Acute bronchospasm: Secondary | ICD-10-CM

## 2022-03-09 NOTE — Telephone Encounter (Signed)
Medication Refill - Medication: albuterol (VENTOLIN HFA) 108 (90 Base) MCG/ACT inhaler  Has the patient contacted their pharmacy? yes (Agent: If no, request that the patient contact the pharmacy for the refill. If patient does not wish to contact the pharmacy document the reason why and proceed with request.) (Agent: If yes, when and what did the pharmacy advise?)contact pcp  Preferred Pharmacy (with phone number or street name):  [place], [place] Phone:  [phone]  Fax:  [phone]     Has the patient been seen for an appointment in the last year OR does the patient have an upcoming appointment? yes  Agent: Please be advised that RX refills may take up to 3 business days. We ask that you follow-up with your pharmacy.

## 2022-03-10 MED ORDER — ALBUTEROL SULFATE HFA 108 (90 BASE) MCG/ACT IN AERS: 2.0000 | INHALATION_SPRAY | RESPIRATORY_TRACT | 3 refills | Status: DC | PRN

## 2022-03-10 NOTE — Telephone Encounter (Signed)
Requested Prescriptions  Pending Prescriptions Disp Refills  . albuterol (VENTOLIN HFA) 108 (90 Base) MCG/ACT inhaler 1 each 3    Sig: Inhale 2 puffs into the lungs every 4 (four) hours as needed for wheezing or shortness of breath (cough).     Pulmonology:  Beta Agonists 2 Passed - 03/09/2022 11:03 AM      Passed - Last BP in normal range    BP Readings from Last 1 Encounters:  09/30/21 122/84         Passed - Last Heart Rate in normal range    Pulse Readings from Last 1 Encounters:  09/30/21 80         Passed - Valid encounter within last 12 months    Recent Outpatient Visits          5 months ago Acute gastritis, presence of bleeding unspecified, unspecified gastritis type   [place], DO   6 months ago Annual physical exam   [place], DO   6 months ago Mild persistent asthma with acute exacerbation   [place], DO   10 months ago Other fatigue   [facility] [place], Coralie Keens, NP   11 months ago Gross hematuria   [facility] [place] [facility] [place], Coralie Keens, NP      Future Appointments            In 1 week Parks Ranger, Devonne Doughty, [facility], [facility]

## 2022-03-19 ENCOUNTER — Encounter: Payer: Self-pay | Admitting: Family Medicine

## 2022-03-19 ENCOUNTER — Ambulatory Visit (INDEPENDENT_AMBULATORY_CARE_PROVIDER_SITE_OTHER): Payer: Medicare PPO | Admitting: Family Medicine

## 2022-03-19 VITALS — BP 146/90 | HR 84 | Ht 62.0 in | Wt 175.0 lb

## 2022-03-19 DIAGNOSIS — N3001 Acute cystitis with hematuria: Secondary | ICD-10-CM

## 2022-03-19 DIAGNOSIS — R1013 Epigastric pain: Secondary | ICD-10-CM

## 2022-03-19 DIAGNOSIS — R350 Frequency of micturition: Secondary | ICD-10-CM

## 2022-03-19 DIAGNOSIS — R310 Gross hematuria: Secondary | ICD-10-CM | POA: Diagnosis not present

## 2022-03-19 DIAGNOSIS — Z23 Encounter for immunization: Secondary | ICD-10-CM | POA: Diagnosis not present

## 2022-03-19 DIAGNOSIS — R7309 Other abnormal glucose: Secondary | ICD-10-CM | POA: Diagnosis not present

## 2022-03-19 MED ORDER — CIPROFLOXACIN HCL 500 MG PO TABS: 500.0000 mg | ORAL_TABLET | Freq: Two times a day (BID) | ORAL | 0 refills | Status: AC

## 2022-03-19 NOTE — Patient Instructions (Addendum)
Thank you for coming to the office today.  1. You have a Urinary Tract Infection - this is very common, your symptoms are reassuring and you should get better within 1 week on the antibiotics - Start Cipro  '500mg'$  2 times daily for next 7 days, complete entire course, even if feeling better - We sent urine for a culture, we will call you within next few days if we need to change antibiotics - Please drink plenty of fluids, improve hydration over next 1 week  If symptoms worsening, developing nausea / vomiting, worsening back pain, fevers / chills / sweats, then please return for re-evaluation sooner.  If you take AZO URINARY PAIN RELIEF OTC - limit this to 2-3 days MAX to avoid affecting kidneys. TEMPORARY numbing soothing of bladder and urine, so it may be easier to sleep at times. Don't take 1-2 days before going to urologist.  AZO or other company = D-Mannose is a natural supplement that can actually help bind to urinary bacteria and reduce their effectiveness it can help prevent UTI from forming, and may reduce some symptoms. It likely cannot cure an active UTI but it is worth a try and good to prevent them with. Try '500mg'$  twice a day at a full dose if you want, or check package instructions for more info  Referral to Urology  [facility] -[address] [place],  [place]  [address] Phone: [phone]  Please schedule a Follow-up Appointment to: Return if symptoms worsen or fail to improve.  If you have any other questions or concerns, please feel free to call the office or send a message through [facility]. You may also schedule an earlier appointment if necessary.  Additionally, you may be receiving a survey about your experience at our office within a few days to 1 week by e-mail or mail. We value your feedback.  Nobie Putnam, DO [facility]

## 2022-03-19 NOTE — Progress Notes (Signed)
Subjective:    Patient ID: Kendra Allison, female    DOB: 1955/11/24, 66 y.o.   MRN: [id number]  Kendra Allison is a 66 y.o. female presenting on 03/19/2022 for Hypertension, Insomnia, Urinary Tract Infection (X 3 weeks, Lower back pain, abdominal pain, hematuria, itching, odor, discharge ), and Flu Vaccine   HPI  GERD / History of positive H Pylori Epigastric Abdominal Pain / Hiatal Hernia ED visit 09/24/21 - dx with small hiatal hernia GERD esophagitis, treated with testing H Pylori negative. CT Abdomen done. - She was started on Pantoprazole 73m daily, then I saw her in follow up and raised dose to twice daily 482m Referred to [facility]. They consulted. - H Pylori empiric treatment was successful  Admits associated with some dyspnea. Has used Albuterol PRN with some relief. New problem pain in left lower back.  UTI Previously treated for UTI with temporary results. Admits urine can be cloudy or bubbles Admits hematuria and strong odor. Asking about lab tests today, Chemistry, CBC, A1c  Insomnia Poor Sleep Tried Melatonin and Hydroxyzine. If urination would improve she would sleep better  Health Maintenance: Flu Shot     03/19/2022    8:19 AM 09/08/2021    8:11 AM 08/19/2021    8:26 AM  Depression screen PHQ 2/9  Decreased Interest 0 0 0  Down, Depressed, Hopeless 0 0   PHQ - 2 Score 0 0 0  Altered sleeping 2 0 0  Tired, decreased energy _0 Change in appetite 0 0 0  Feeling bad or failure about yourself  0 0 0  Trouble concentrating 0 0 0  Moving slowly or fidgety/restless 0 0 0  Suicidal thoughts 0 0 0  PHQ-9 Score _1 Difficult doing work/chores Not difficult at all Not difficult at all Not difficult at all    Social History   Tobacco Use   Smoking status: Never   Smokeless tobacco: Never   Tobacco comments:    Quit 40years ago.   Vaping Use   Vaping Use: Never used  Substance Use Topics   Alcohol use: Yes    Comment: "rarely"    Drug use:  Never    Review of Systems Per HPI unless specifically indicated above     Objective:    BP (!) 146/90 (BP Location: Right Arm)   Pulse 84   Ht _2  (1.575 m)   Wt 175 lb (79.4 kg)   SpO2 97%   BMI 32.01 kg/m   Wt Readings from Last 3 Encounters:  03/19/22 175 lb (79.4 kg)  09/30/21 174 lb (78.9 kg)  09/08/21 176 lb 6.4 oz (80 kg)    Physical Exam Vitals and nursing note reviewed.  Constitutional:      General: She is not in acute distress.    Appearance: Normal appearance. She is well-developed. She is not diaphoretic.     Comments: Well-appearing, comfortable, cooperative  HENT:     Head: Normocephalic and atraumatic.  Eyes:     General:        Right eye: No discharge.        Left eye: No discharge.     Conjunctiva/sclera: Conjunctivae normal.  Cardiovascular:     Rate and Rhythm: Normal rate.  Pulmonary:     Effort: Pulmonary effort is normal.  Skin:    General: Skin is warm and dry.     Findings: No erythema or rash.  Neurological:     Mental Status:  She is alert and oriented to person, place, and time.  Psychiatric:        Mood and Affect: Mood normal.        Behavior: Behavior normal.        Thought Content: Thought content normal.     Comments: Well groomed, good eye contact, normal speech and thoughts     I have personally reviewed the radiology report from 09/24/21 CT.  Procedure Note  Debria Garret, MD - 09/24/2021   Formatting of this note might be different from the original. Examination: CT scan of abdomen and pelvis with only intravenous contrast.  Indication: Epigastric pain, R10.13 Epigastric pain. IV Contrast Only.  Contrast: 100 cc Isovue-300 intravenously.  Comparison: None.  Technique: Contiguous images from the domes of the diaphragm to pubic symphysis with IV contrast were obtained.  Note: Dose reduction was obtained with Automatic Exposure Control (AEC) or, if AEC could not be utilized, by manual adjustment of the mA  and/or kV according to patient size.  Findings:  Lung bases: Small hiatal hernia is noted. Nonspecific mild thickening of the distal esophagus may or may not be due to reflux esophagitis. Mild dependent atelectatic changes noted. Heart size is mildly enlarged. No pericardial effusion.  Abdomen: Small low-density lesion in the subcapsular right hepatic lobe on series 2 image 17-18 measuring 6 mm has attenuation value of 63 Hounsfield units. Due to small size the attenuation value is not reliable. 4.5 x 5.3 x 5.2 cm water attenuation mass at the upper pole of the left kidney, most consistent with a simple renal cyst. Otherwise kidneys are unremarkable. Scans of the gallbladder, spleen, both adrenal glands, and the pancreas are unremarkable. There is no evidence for retroperitoneal adenopathy. Mild atherosclerosis.  GI tract, mesentery, omentum, abdominal wall: Tiny fat-containing umbilical hernia noted. Sigmoid diverticula noted without diverticulitis. Otherwise the visualized bowel loops are unremarkable. Appendix is normal. No mesenteric fat stranding. No pneumatosis or pneumoperitoneum. No intestinal obstruction or ileus.  Pelvis: No mass, free fluid or adenopathy noted.  Bones: Mild spondylitic changes of the lower thoracic spine and lower lumbar spine. No aggressive osseous lesions.  IMPRESSION:  1. No acute findings to explain patient's epigastric pain. 2. Few incidental small low-density lesions involving the liver are too small to accurately characterize. These are unlikely to be the cause of patient's current symptoms. In absence of known malignancy these may represent benign finding. Consider further confirmation by elective contrast enhanced abdomen MRI. 3. Left renal cyst, tiny umbilical hernia, diverticulosis etc.   Electronically Signed by: Lester Morganton, MD, [facility]    -------------------------------  Patient Name: Kendra Allison Attending MD:  Louie Casa , MD,  Date of Birth: 1955/09/28 Procedure Date: 02/10/2022 10:11 AM Age: 85 Instrument Name: [id number] MRN: [id number] Account Number: [id number] Procedure: Upper GI endoscopy Indications: Epigastric abdominal pain Providers: Roe Rutherford. Gershon Cull, MD Referring MD: Woodroe Chen, Caro Hight, MD Complications: No immediate complications. Procedure: After obtaining informed consent, the endoscope was  passed under direct vision. Throughout the procedure,  the patient's blood pressure, pulse, and oxygen  saturations were monitored continuously. The Flexible  endoscope was introduced through the mouth, and  advanced to the second part of duodenum. Findings: LA Grade A (one or more mucosal breaks less than 5 mm,  not extending between tops of 2 mucosal folds)  esophagitis with no bleeding was found at the  gastroesophageal junction. Biopsies were taken with a  cold forceps for histology. A medium-sized hiatal hernia was  present. Striped mildly erythematous mucosa without bleeding  was found in the gastric antrum. Biopsies were taken  with a cold forceps for Helicobacter pylori testing. The examined duodenum was normal. Biopsies were taken  with a cold forceps for histology. Moderate Sedation: This procedure was performed with monitored anesthesia care (MAC)  administered by an anesthesiology provider. This procedure was performed with monitored anesthesia care (MAC)  administered by an anesthesiology provider. Estimated Blood Loss: Estimated blood loss was minimal. Impression: - LA Grade A reflux esophagitis with no bleeding.  Biopsied. - Medium-sized hiatal hernia. - Erythematous mucosa in the antrum. Biopsied. - Normal examined duodenum. Biopsied. Recommendation: - Discharge patient to home (ambulatory). - Resume regular diet. - Await pathology results. - The findings and recommendations were discussed with  the patient. Attending Participation: I  personally performed the entire procedure. Louie Casa, MD 02/10/2022 11:45:59 AM This report has been signed electronically. Number of Addenda: 0 Note Initiated On: 02/10/2022 10:11 AM Electronically signed by Maylon Cos, MD at 02/10/2022 11:46 AM EDT   A. Duodenum, endoscopic biopsy:   Duodenal mucosa with no significant pathologic diagnosis. No villous blunting or intraepithelial lymphocytosis is seen.     B. Stomach, endoscopic biopsy:   Gastric antral mucosa with mild chronic gastritis and reactive/reparative changes, including focal fibrosis in the lamina propria. Separate fragments of gastric oxyntic mucosa with minimal chronic inflammation. No active gastritis is seen. Negative for evidence of Helicobacter pylori on routine H&E.      C. Esophagus, endoscopic biopsy:   Fragment of mucosa of the squamo-columnar junction with chronic inflammation, basal cell hyperplasia, and reactive epithelial changes compatible with reflux esophagitis. No goblet cell intestinal metaplasia is seen to suggest a diagnosis of Barrett's esophagus on morphologic grounds. Separate fragments of esophageal squamous mucosa with no significant pathologic diagnosis. No increase in intraepithelial eosinophils is seen.  Separate fragment of unremarkable small bowel mucosa.     D. Colon, random, endoscopic biopsy:    Colonic mucosa with no significant pathologic diagnosis. No chronic or active colitis or granulomas are seen. No evidence of lymphocytic or collagenous colitis is seen.   Results for orders placed or performed in visit on 09/05/21  VITAMIN D 25 Hydroxy (Vit-D Deficiency, Fractures)  Result Value Ref Range   Vit D, 25-Hydroxy 75 30 - 100 ng/mL  Vitamin B12  Result Value Ref Range   Vitamin B-12 >2,000 (H) 200 - 1,100 pg/mL  TSH  Result Value Ref Range   TSH 1.53 0.40 - 4.50 mIU/L  Hemoglobin A1c  Result Value Ref Range   Hgb A1c MFr Bld 5.5 <5.7 % of total Hgb    Mean Plasma Glucose 111 mg/dL   eAG (mmol/L) 6.2 mmol/L  Lipid panel  Result Value Ref Range   Cholesterol 130 <200 mg/dL   HDL 54 > OR = 50 mg/dL   Triglycerides 68 <150 mg/dL   LDL Cholesterol (Calc) 62 mg/dL (calc)   Total CHOL/HDL Ratio 2.4 <5.0 (calc)   Non-HDL Cholesterol (Calc) 76 <130 mg/dL (calc)  CBC with Differential/Platelet  Result Value Ref Range   WBC 4.6 3.8 - 10.8 Thousand/uL   RBC 4.29 3.80 - 5.10 Million/uL   Hemoglobin 12.7 11.7 - 15.5 g/dL   HCT 38.9 35.0 - 45.0 %   MCV 90.7 80.0 - 100.0 fL   MCH 29.6 27.0 - 33.0 pg   MCHC 32.6 32.0 - 36.0 g/dL   RDW 12.6 11.0 - 15.0 %   Platelets 316 140 -  400 Thousand/uL   MPV 9.7 7.5 - 12.5 fL   Neutro Abs 2,392 1,500 - 7,800 cells/uL   Lymphs Abs 1,596 850 - 3,900 cells/uL   Absolute Monocytes 432 200 - 950 cells/uL   Eosinophils Absolute 129 15 - 500 cells/uL   Basophils Absolute 51 0 - 200 cells/uL   Neutrophils Relative % 52 %   Total Lymphocyte 34.7 %   Monocytes Relative 9.4 %   Eosinophils Relative 2.8 %   Basophils Relative 1.1 %  COMPLETE METABOLIC PANEL WITH GFR  Result Value Ref Range   Glucose, Bld 93 65 - 99 mg/dL   BUN 20 7 - 25 mg/dL   Creat 1.01 0.50 - 1.05 mg/dL   eGFR 62 > OR = 60 mL/min/1.18m   BUN/Creatinine Ratio NOT APPLICABLE 6 - 22 (calc)   Sodium 142 135 - 146 mmol/L   Potassium 3.7 3.5 - 5.3 mmol/L   Chloride 103 98 - 110 mmol/L   CO2 29 20 - 32 mmol/L   Calcium 10.2 8.6 - 10.4 mg/dL   Total Protein 7.0 6.1 - 8.1 g/dL   Albumin 4.6 3.6 - 5.1 g/dL   Globulin 2.4 1.9 - 3.7 g/dL (calc)   AG Ratio 1.9 1.0 - 2.5 (calc)   Total Bilirubin 0.7 0.2 - 1.2 mg/dL   Alkaline phosphatase (APISO) 74 37 - 153 U/L   AST 22 10 - 35 U/L   ALT 18 6 - 29 U/L      Assessment & Plan:   Problem List Items Addressed This Visit   None Visit Diagnoses     Gross hematuria    -  Primary   Relevant Orders   Ambulatory referral to Urology   CBC with Differential/Platelet   COMPLETE METABOLIC PANEL  WITH GFR   Urinalysis, Routine w reflex microscopic   Urine Culture   Acute cystitis with hematuria       Relevant Medications   ciprofloxacin (CIPRO) 500 MG tablet   Other Relevant Orders   Ambulatory referral to Urology   Urinalysis, Routine w reflex microscopic   Urine Culture   Urinary frequency       Relevant Orders   Ambulatory referral to Urology   Urinalysis, Routine w reflex microscopic   Urine Culture   Epigastric pain       Elevated hemoglobin A1c       Relevant Orders   Hemoglobin A1c   Need for immunization against influenza       Relevant Orders   Flu Vaccine QUAD High Dose(Fluad) (Completed)      UTI Urinalysis Urine Culture Start empiric Cipro BID 7 day, counseling on MSK risk  Referral to Urology BUA  gross hematuria, urinary frequency recurrent UTI but has had some negative urine cultures, possible chronic interstitial cystitis or pelvic pain, very foul urinary odor. Has had CT Abd Pelvis 09/2021, requesting further work up for gross hematuria and her persistent symptoms.   May warrant Cystoscopy for hematuria and recurrent symptoms.  #GERD Hiatal Hernia Suspected some epigastric symptoms attributed to this Already on max dose PPI 40 BID Has seen GI and EGD already overall mostly unremarkable Has negative H Pylori Check labs Reconsider return to GI if indicated   Orders Placed This Encounter  Procedures   Urine Culture   Flu Vaccine QUAD High Dose(Fluad)   CBC with Differential/Platelet   COMPLETE METABOLIC PANEL WITH GFR   Hemoglobin A1c   Urinalysis, Routine w reflex microscopic   Ambulatory referral to  Urology    Referral Priority:   Routine    Referral Type:   Consultation    Referral Reason:   Specialty Services Required    Requested Specialty:   Urology    Number of Visits Requested:   1     Meds ordered this encounter  Medications   ciprofloxacin (CIPRO) 500 MG tablet    Sig: Take 1 tablet (500 mg total) by mouth 2 (two) times  daily for 7 days.    Dispense:  14 tablet    Refill:  0      Follow up plan: Return if symptoms worsen or fail to improve.   Nobie Putnam, [facility] 03/19/2022, 8:19 AM

## 2022-03-21 LAB — CBC WITH DIFFERENTIAL/PLATELET
Absolute Monocytes: 400 cells/uL (ref 200–950)
Basophils Absolute: 69 cells/uL (ref 0–200)
Basophils Relative: 1.6 %
Eosinophils Absolute: 82 cells/uL (ref 15–500)
Eosinophils Relative: 1.9 %
HCT: 40.4 % (ref 35.0–45.0)
Hemoglobin: 13.5 g/dL (ref 11.7–15.5)
Lymphs Abs: 1673 cells/uL (ref 850–3900)
MCH: 29.9 pg (ref 27.0–33.0)
MCHC: 33.4 g/dL (ref 32.0–36.0)
MCV: 89.6 fL (ref 80.0–100.0)
MPV: 9.8 fL (ref 7.5–12.5)
Monocytes Relative: 9.3 %
Neutro Abs: 2077 cells/uL (ref 1500–7800)
Neutrophils Relative %: 48.3 %
Platelets: 352 10*3/uL (ref 140–400)
RBC: 4.51 10*6/uL (ref 3.80–5.10)
RDW: 12.3 % (ref 11.0–15.0)
Total Lymphocyte: 38.9 %
WBC: 4.3 10*3/uL (ref 3.8–10.8)

## 2022-03-21 LAB — COMPLETE METABOLIC PANEL WITH GFR
AG Ratio: 2.1 (calc) (ref 1.0–2.5)
ALT: 16 U/L (ref 6–29)
AST: 20 U/L (ref 10–35)
Albumin: 5.2 g/dL — ABNORMAL HIGH (ref 3.6–5.1)
Alkaline phosphatase (APISO): 75 U/L (ref 37–153)
BUN: 14 mg/dL (ref 7–25)
CO2: 30 mmol/L (ref 20–32)
Calcium: 10.1 mg/dL (ref 8.6–10.4)
Chloride: 102 mmol/L (ref 98–110)
Creat: 0.91 mg/dL (ref 0.50–1.05)
Globulin: 2.5 g/dL (calc) (ref 1.9–3.7)
Glucose, Bld: 94 mg/dL (ref 65–99)
Potassium: 4.1 mmol/L (ref 3.5–5.3)
Sodium: 140 mmol/L (ref 135–146)
Total Bilirubin: 0.6 mg/dL (ref 0.2–1.2)
Total Protein: 7.7 g/dL (ref 6.1–8.1)
eGFR: 70 mL/min/{1.73_m2} (ref >=60)

## 2022-03-21 LAB — URINALYSIS, ROUTINE W REFLEX MICROSCOPIC
Bilirubin Urine: NEGATIVE
Glucose, UA: NEGATIVE
Hgb urine dipstick: NEGATIVE
Ketones, ur: NEGATIVE
Leukocytes,Ua: NEGATIVE
Nitrite: NEGATIVE
Protein, ur: NEGATIVE
Specific Gravity, Urine: 1.007 (ref 1.001–1.035)
pH: 7.5 (ref 5.0–8.0)

## 2022-03-21 LAB — HEMOGLOBIN A1C
Hgb A1c MFr Bld: 5.7 % of total Hgb — ABNORMAL HIGH (ref <5.7)
Mean Plasma Glucose: 117 mg/dL
eAG (mmol/L): 6.5 mmol/L

## 2022-03-21 LAB — URINE CULTURE
MICRO NUMBER:: 14106091
Result:: NO GROWTH
SPECIMEN QUALITY:: ADEQUATE

## 2022-04-02 ENCOUNTER — Ambulatory Visit: Payer: Medicare PPO | Admitting: Urology

## 2022-04-02 ENCOUNTER — Encounter: Payer: Self-pay | Admitting: Urology

## 2022-04-02 VITALS — BP 156/92 | HR 82 | Ht 62.0 in | Wt 173.0 lb

## 2022-04-02 DIAGNOSIS — R310 Gross hematuria: Secondary | ICD-10-CM | POA: Diagnosis not present

## 2022-04-02 DIAGNOSIS — R350 Frequency of micturition: Secondary | ICD-10-CM

## 2022-04-02 DIAGNOSIS — Z87898 Personal history of other specified conditions: Secondary | ICD-10-CM

## 2022-04-02 LAB — URINALYSIS, COMPLETE
Bilirubin, UA: NEGATIVE
Glucose, UA: NEGATIVE
Ketones, UA: NEGATIVE
Nitrite, UA: NEGATIVE
Protein,UA: NEGATIVE
RBC, UA: NEGATIVE
Specific Gravity, UA: 1.02 (ref 1.005–1.030)
Urobilinogen, Ur: 0.2 mg/dL (ref 0.2–1.0)
pH, UA: 5 (ref 5.0–7.5)

## 2022-04-02 LAB — MICROSCOPIC EXAMINATION

## 2022-04-02 MED ORDER — GEMTESA 75 MG PO TABS: 75.0000 mg | ORAL_TABLET | Freq: Every day | ORAL | 0 refills | Status: DC

## 2022-04-02 NOTE — Progress Notes (Signed)
04/02/2022 9:59 AM   Kendra Allison 26-Apr-1956 [id number]  Referring provider: Olin Hauser, DO [address] [place],  [place] [address]  Chief Complaint  Patient presents with   Hematuria    HPI: Kendra Allison is a 66 y.o. female referred for recurrent UTI and hematuria.  Complains of malodorous urine and intermittent gross hematuria.  Has urinary frequency and nocturia every 2 hours for the last 2 months. Complains of vaginal discharge and itching.  States she had a negative GYN evaluation no notes in epic All UAs and urine cultures have been negative CT abdomen/pelvis with contrast 09/24/2021 showed a simple renal cyst   PMH: Past Medical History:  Diagnosis Date   Asthma    GERD (gastroesophageal reflux disease)    Hypercholesteremia    Hyperlipidemia    Hypertension    Osteoporosis     Surgical History: Past Surgical History:  Procedure Laterality Date   ABDOMINAL HYSTERECTOMY     BREAST BIOPSY     COLONOSCOPY WITH PROPOFOL N/A 08/10/2019   Procedure: COLONOSCOPY WITH PROPOFOL;  Surgeon: Jonathon Bellows, MD;  Location: [facility] ENDOSCOPY;  Service: Gastroenterology;  Laterality: N/A;   ESOPHAGOGASTRODUODENOSCOPY (EGD) WITH PROPOFOL N/A 08/10/2019   Procedure: ESOPHAGOGASTRODUODENOSCOPY (EGD) WITH PROPOFOL;  Surgeon: Jonathon Bellows, MD;  Location: [facility] ENDOSCOPY;  Service: Gastroenterology;  Laterality: N/A;   EXTERNAL FIXATION LEG Left 04/07/2020    EXTERNAL FIXATION LEG (Left Leg Lower)   EXTERNAL FIXATION LEG Left 04/07/2020   Procedure: EXTERNAL FIXATION LEG;  Surgeon: Shona Needles, MD;  Location: [facility];  Service: Orthopedics;  Laterality: Left;   EXTERNAL FIXATION REMOVAL Left 04/10/2020   Procedure: REMOVAL EXTERNAL FIXATION LEG;  Surgeon: Shona Needles, MD;  Location: [place];  Service: Orthopedics;  Laterality: Left;   I & D EXTREMITY Left 04/07/2020   Procedure: IRRIGATION AND DEBRIDEMENT EXTREMITY;  Surgeon: Shona Needles, MD;  Location: [facility];   Service: Orthopedics;  Laterality: Left;   I & D EXTREMITY Left 06/12/2020   Procedure: IRRIGATION AND DEBRIDEMENT EXTREMITY;  Surgeon: Shona Needles, MD;  Location: [place] [facility];  Service: Orthopedics;  Laterality: Left;   [place]  2014   TIBIA IM NAIL INSERTION Left 04/10/2020   Procedure: INTRAMEDULLARY (IM) NAIL TIBIAL;  Surgeon: Shona Needles, MD;  Location: [place];  Service: Orthopedics;  Laterality: Left;    Home Medications:  Allergies as of 04/02/2022   No Known Allergies      Medication List        Accurate as of April 02, 2022  9:59 AM. If you have any questions, ask your nurse or doctor.          albuterol 108 (90 Base) MCG/ACT inhaler Commonly known as: VENTOLIN HFA Inhale 2 puffs into the lungs every 4 (four) hours as needed for wheezing or shortness of breath (cough).   amLODipine 10 MG tablet Commonly known as: NORVASC TAKE 1 TABLET EVERY DAY   celecoxib 200 MG capsule Commonly known as: CELEBREX Take 200 mg by mouth 2 (two) times daily as needed.   dicyclomine 10 MG capsule Commonly known as: BENTYL Take 1 capsule (10 mg total) by mouth 3 (three) times daily before meals. As needed for abdominal pain cramping bloating   famotidine 20 MG tablet Commonly known as: PEPCID Take by mouth.   fluticasone 50 MCG/ACT nasal spray Commonly known as: FLONASE Place 2 sprays into both nostrils daily as needed for allergies.   hydrochlorothiazide 25 MG tablet Commonly known  as: HYDRODIURIL TAKE 1 TABLET(25 MG) BY MOUTH DAILY   montelukast 10 MG tablet Commonly known as: SINGULAIR TAKE 1 TABLET(10 MG) BY MOUTH AT BEDTIME What changed: See the new instructions.   multivitamin with minerals Tabs tablet Take 1 tablet by mouth daily. One-A-Day Multivitamin   pantoprazole 40 MG tablet Commonly known as: PROTONIX Take 1 tablet (40 mg total) by mouth 2 (two) times daily before a meal.   rosuvastatin 10 MG tablet Commonly known as: CRESTOR Take 1 tablet (10 mg  total) by mouth at bedtime.   Symbicort 160-4.5 MCG/ACT inhaler Generic drug: budesonide-formoterol Inhale 2 puffs into the lungs in the morning and at bedtime.   zolpidem 6.25 MG CR tablet Commonly known as: AMBIEN CR Take 1 tablet (6.25 mg total) by mouth at bedtime as needed for sleep.        Allergies: No Known Allergies  Family History: History reviewed. No pertinent family history.  Social History:  reports that she has never smoked. She has never used smokeless tobacco. She reports current alcohol use. She reports that she does not use drugs.   Physical Exam: BP (!) 156/92   Pulse 82   Ht '5\' 2"'$  (1.575 m)   Wt 173 lb (78.5 kg)   BMI 31.64 kg/m   Constitutional:  Alert and oriented, No acute distress. HEENT: [place] AT Respiratory: Normal respiratory effort, no increased work of breathing. Skin: No rashes, bruises or suspicious lesions. Neurologic: Grossly intact, no focal deficits, moving all 4 extremities. Psychiatric: Normal mood and affect.  Laboratory Data:  Urinalysis Microscopy 10/29/2008 WBC  Assessment & Plan:    66 y.o. female with history of intermittent gross hematuria; UAs have been negative for blood.  CT abdomen/pelvis with contrast performed at [facility] 09/2021 showed no significant abnormalities UAs have been negative as well as urine cultures so she does not meet the AUA criteria for recurrent UTI.  UA today did show mild pyuria and urine culture was ordered Complains of urinary frequency and nocturia Schedule cystoscopy for further evaluation Trial Gemtesa 75 mg daily-samples given    Abbie Sons, MD  [place] [address] [place], [place] [address] [phone]

## 2022-04-05 ENCOUNTER — Encounter: Payer: Self-pay | Admitting: Urology

## 2022-04-05 LAB — CULTURE, URINE COMPREHENSIVE

## 2022-04-06 ENCOUNTER — Encounter: Payer: Self-pay | Admitting: *Deleted

## 2022-04-06 ENCOUNTER — Telehealth: Payer: Self-pay

## 2022-04-06 NOTE — Telephone Encounter (Signed)
LM on triage line 945am  Pt is requesting u/c results.   Pt aware JQ sent my charge message- u/c neg.   Pt requests earlier cysto appt. Moved to 11/29.

## 2022-04-22 ENCOUNTER — Ambulatory Visit (INDEPENDENT_AMBULATORY_CARE_PROVIDER_SITE_OTHER): Payer: Medicare PPO | Admitting: Urology

## 2022-04-22 ENCOUNTER — Encounter: Payer: Self-pay | Admitting: Urology

## 2022-04-22 VITALS — BP 153/76 | HR 76 | Ht 66.0 in | Wt 171.0 lb

## 2022-04-22 DIAGNOSIS — Z87898 Personal history of other specified conditions: Secondary | ICD-10-CM

## 2022-04-22 DIAGNOSIS — R350 Frequency of micturition: Secondary | ICD-10-CM | POA: Diagnosis not present

## 2022-04-22 DIAGNOSIS — R82998 Other abnormal findings in urine: Secondary | ICD-10-CM

## 2022-04-22 LAB — URINALYSIS, COMPLETE
Bilirubin, UA: NEGATIVE
Glucose, UA: NEGATIVE
Ketones, UA: NEGATIVE
Leukocytes,UA: NEGATIVE
Nitrite, UA: NEGATIVE
Protein,UA: NEGATIVE
Specific Gravity, UA: 1.02 (ref 1.005–1.030)
Urobilinogen, Ur: 0.2 mg/dL (ref 0.2–1.0)
pH, UA: 7 (ref 5.0–7.5)

## 2022-04-22 LAB — MICROSCOPIC EXAMINATION: Bacteria, UA: NONE SEEN

## 2022-04-22 NOTE — Progress Notes (Signed)
   04/22/22  CC:  Chief Complaint  Patient presents with   Cysto    HPI: Refer to prior note 04/02/2022.  Urine culture grew mixed flora.  UA today is clear.  Blood pressure (!) 153/76, pulse 76, height '5\' 6"'$  (1.676 m), weight 171 lb (77.6 kg). NED. A&Ox3.   No respiratory distress   Abd soft, NT, ND Normal external genitalia with patent urethral meatus  Cystoscopy Procedure Note  Patient identification was confirmed, informed consent was obtained, and patient was prepped using Betadine solution.  Lidocaine jelly was administered per urethral meatus.    Procedure: - Flexible cystoscope introduced, without any difficulty.   - Thorough search of the bladder revealed:    normal urethral meatus    normal urothelium    no stones    no ulcers     no tumors    no urethral polyps    no trabeculation  - Ureteral orifices were normal in position and appearance.  Post-Procedure: - Patient tolerated the procedure well  Assessment/ Plan: No abnormalities on cystoscopy UA today negative No pathologic finding to account for malodorous urine.  This may be secondary to diet or medication    Kendra Sons, MD

## 2022-04-24 ENCOUNTER — Other Ambulatory Visit: Payer: Medicare PPO | Admitting: Urology

## 2022-05-01 ENCOUNTER — Other Ambulatory Visit: Payer: Medicare PPO | Admitting: Urology

## 2022-05-04 ENCOUNTER — Other Ambulatory Visit: Payer: Self-pay | Admitting: Family Medicine

## 2022-05-04 DIAGNOSIS — E782 Mixed hyperlipidemia: Secondary | ICD-10-CM

## 2022-05-11 DIAGNOSIS — M65871 Other synovitis and tenosynovitis, right ankle and foot: Secondary | ICD-10-CM | POA: Diagnosis not present

## 2022-05-11 DIAGNOSIS — S82292G Other fracture of shaft of left tibia, subsequent encounter for closed fracture with delayed healing: Secondary | ICD-10-CM | POA: Diagnosis not present

## 2022-05-11 DIAGNOSIS — Q742 Other congenital malformations of lower limb(s), including pelvic girdle: Secondary | ICD-10-CM | POA: Diagnosis not present

## 2022-05-11 DIAGNOSIS — M65872 Other synovitis and tenosynovitis, left ankle and foot: Secondary | ICD-10-CM | POA: Diagnosis not present

## 2022-05-11 DIAGNOSIS — S82872S Displaced pilon fracture of left tibia, sequela: Secondary | ICD-10-CM | POA: Diagnosis not present

## 2022-05-11 DIAGNOSIS — S82492S Other fracture of shaft of left fibula, sequela: Secondary | ICD-10-CM | POA: Diagnosis not present

## 2022-05-21 DIAGNOSIS — R0989 Other specified symptoms and signs involving the circulatory and respiratory systems: Secondary | ICD-10-CM | POA: Diagnosis not present

## 2022-05-21 DIAGNOSIS — J301 Allergic rhinitis due to pollen: Secondary | ICD-10-CM | POA: Diagnosis not present

## 2022-05-21 DIAGNOSIS — J309 Allergic rhinitis, unspecified: Secondary | ICD-10-CM | POA: Diagnosis not present

## 2022-06-05 DIAGNOSIS — Z01419 Encounter for gynecological examination (general) (routine) without abnormal findings: Secondary | ICD-10-CM | POA: Diagnosis not present

## 2022-06-05 DIAGNOSIS — Z01411 Encounter for gynecological examination (general) (routine) with abnormal findings: Secondary | ICD-10-CM | POA: Diagnosis not present

## 2022-06-05 DIAGNOSIS — Z6828 Body mass index (BMI) 28.0-28.9, adult: Secondary | ICD-10-CM | POA: Diagnosis not present

## 2022-06-05 DIAGNOSIS — N952 Postmenopausal atrophic vaginitis: Secondary | ICD-10-CM | POA: Diagnosis not present

## 2022-06-05 DIAGNOSIS — Z124 Encounter for screening for malignant neoplasm of cervix: Secondary | ICD-10-CM | POA: Diagnosis not present

## 2022-06-05 DIAGNOSIS — M8580 Other specified disorders of bone density and structure, unspecified site: Secondary | ICD-10-CM | POA: Diagnosis not present

## 2022-06-05 DIAGNOSIS — Z90710 Acquired absence of both cervix and uterus: Secondary | ICD-10-CM | POA: Diagnosis not present

## 2022-06-05 DIAGNOSIS — Z1231 Encounter for screening mammogram for malignant neoplasm of breast: Secondary | ICD-10-CM | POA: Diagnosis not present

## 2022-06-09 ENCOUNTER — Other Ambulatory Visit: Payer: Self-pay | Admitting: Obstetrics & Gynecology

## 2022-06-09 DIAGNOSIS — M8580 Other specified disorders of bone density and structure, unspecified site: Secondary | ICD-10-CM

## 2022-07-06 ENCOUNTER — Other Ambulatory Visit: Payer: Self-pay | Admitting: Family Medicine

## 2022-07-06 DIAGNOSIS — I10 Essential (primary) hypertension: Secondary | ICD-10-CM

## 2022-07-06 DIAGNOSIS — E782 Mixed hyperlipidemia: Secondary | ICD-10-CM

## 2022-07-07 NOTE — Telephone Encounter (Signed)
Requested medication (s) are due for refill today: yes  Requested medication (s) are on the active medication list: yes  Last refill:  11/27/21 #90 1 refills  Future visit scheduled: no   Notes to clinic:  pharmacy requesting 3 refills. Do you want to refill #90 3 refills?     Requested Prescriptions  Pending Prescriptions Disp Refills   amLODipine (NORVASC) 10 MG tablet [Pharmacy Med Name: AMLODIPINE BESYLATE 10 MG Tablet] 90 tablet 1    Sig: TAKE 1 TABLET EVERY DAY     Cardiovascular: Calcium Channel Blockers 2 Failed - 07/06/2022  5:03 PM      Failed - Last BP in normal range    BP Readings from Last 1 Encounters:  04/22/22 (!) 153/76         Passed - Last Heart Rate in normal range    Pulse Readings from Last 1 Encounters:  04/22/22 76         Passed - Valid encounter within last 6 months    Recent Outpatient Visits           3 months ago Gross hematuria   [facility] [place], Devonne Doughty, DO   9 months ago Acute gastritis, presence of bleeding unspecified, unspecified gastritis type   [facility] [place], Devonne Doughty, DO   10 months ago Annual physical exam   [facility] Olin Hauser, DO   10 months ago Mild persistent asthma with acute exacerbation   [place], DO   1 year ago Other fatigue   [facility] [place], Coralie Keens, NP              Signed Prescriptions Disp Refills   rosuvastatin (CRESTOR) 10 MG tablet 90 tablet 2    Sig: TAKE 1 TABLET AT BEDTIME     Cardiovascular:  Antilipid - Statins 2 Failed - 07/06/2022  5:03 PM      Failed - Lipid Panel in normal range within the last 12 months    Cholesterol  Date Value Ref Range Status  09/05/2021 130 <200 mg/dL Final   LDL Cholesterol (Calc)  Date Value Ref Range Status  09/05/2021 62 mg/dL (calc) Final    Comment:     Reference range: <100 . Desirable range <100 mg/dL for primary prevention;   <70 mg/dL for patients with CHD or diabetic patients  with > or = 2 CHD risk factors. Marland Kitchen LDL-C is now calculated using the Martin-Hopkins  calculation, which is a validated novel method providing  better accuracy than the Friedewald equation in the  estimation of LDL-C.  Cresenciano Genre et al. Annamaria Helling. [id number]): 2061-2068  (http://education.[facility].com/faq/FAQ164)    HDL  Date Value Ref Range Status  09/05/2021 54 > OR = 50 mg/dL Final   Triglycerides  Date Value Ref Range Status  09/05/2021 68 <150 mg/dL Final         Passed - Cr in normal range and within 360 days    Creat  Date Value Ref Range Status  03/19/2022 0.91 0.50 - 1.05 mg/dL Final         Passed - Patient is not pregnant      Passed - Valid encounter within last 12 months    Recent Outpatient Visits           3 months ago Gross hematuria   [facility], Devonne Doughty, DO   9 months ago Acute gastritis, presence of bleeding unspecified, unspecified gastritis type   [place], DO   10 months ago Annual physical exam   [facility] Olin Hauser, DO   10 months ago Mild persistent asthma with acute exacerbation   [facility] Olin Hauser, DO   1 year ago Other fatigue   [facility] [place], Coralie Keens, [place]

## 2022-07-07 NOTE — Telephone Encounter (Signed)
Requested Prescriptions  Pending Prescriptions Disp Refills   rosuvastatin (CRESTOR) 10 MG tablet [Pharmacy Med Name: ROSUVASTATIN CALCIUM 10 MG Tablet] 90 tablet 2    Sig: TAKE 1 TABLET AT BEDTIME     Cardiovascular:  Antilipid - Statins 2 Failed - 07/06/2022  5:03 PM      Failed - Lipid Panel in normal range within the last 12 months    Cholesterol  Date Value Ref Range Status  09/05/2021 130 <200 mg/dL Final   LDL Cholesterol (Calc)  Date Value Ref Range Status  09/05/2021 62 mg/dL (calc) Final    Comment:    Reference range: <100 . Desirable range <100 mg/dL for primary prevention;   <70 mg/dL for patients with CHD or diabetic patients  with > or = 2 CHD risk factors. Marland Kitchen LDL-C is now calculated using the Martin-Hopkins  calculation, which is a validated novel method providing  better accuracy than the Friedewald equation in the  estimation of LDL-C.  Cresenciano Genre et al. Annamaria Helling. [id number]): 2061-2068  (http://education.[facility].com/faq/FAQ164)    HDL  Date Value Ref Range Status  09/05/2021 54 > OR = 50 mg/dL Final   Triglycerides  Date Value Ref Range Status  09/05/2021 68 <150 mg/dL Final         Passed - Cr in normal range and within 360 days    Creat  Date Value Ref Range Status  03/19/2022 0.91 0.50 - 1.05 mg/dL Final         Passed - Patient is not pregnant      Passed - Valid encounter within last 12 months    Recent Outpatient Visits           3 months ago Gross hematuria   [facility] [place], Devonne Doughty, DO   9 months ago Acute gastritis, presence of bleeding unspecified, unspecified gastritis type   [facility] [place], Devonne Doughty, DO   10 months ago Annual physical exam   [facility] [place] Hauser, DO   10 months ago Mild persistent asthma with acute exacerbation   [place], DO   1  year ago Other fatigue   [facility] [place], [place], NP               amLODipine ([place]) 10 MG tablet [Pharmacy Med Name: AMLODIPINE BESYLATE 10 MG Tablet] 90 tablet 1    Sig: TAKE 1 TABLET EVERY DAY     Cardiovascular: Calcium Channel Blockers 2 Failed - 07/06/2022  5:03 PM      Failed - Last BP in normal range    BP Readings from Last 1 Encounters:  04/22/22 (!) 153/76         Passed - Last Heart Rate in normal range    Pulse Readings from Last 1 Encounters:  04/22/22 76         Passed - Valid encounter within last 6 months    Recent Outpatient Visits           3 months ago Gross hematuria   [place], DO   9 months ago Acute gastritis, presence of bleeding unspecified, unspecified gastritis type   [place], DO   10 months ago Annual physical exam   [facility] [place], Devonne Doughty, [place]  10 months ago Mild persistent asthma with acute exacerbation   [place], DO   1 year ago Other fatigue   [facility] [place], Coralie Keens, [place]

## 2022-07-14 ENCOUNTER — Ambulatory Visit
Admission: RE | Admit: 2022-07-14 | Discharge: 2022-07-14 | Disposition: A | Discharge status: Home / Self Care | Payer: Medicare PPO | Source: Ambulatory Visit | Attending: Obstetrics & Gynecology | Admitting: Obstetrics & Gynecology

## 2022-07-14 DIAGNOSIS — Z780 Asymptomatic menopausal state: Secondary | ICD-10-CM | POA: Diagnosis not present

## 2022-07-14 DIAGNOSIS — M8580 Other specified disorders of bone density and structure, unspecified site: Secondary | ICD-10-CM

## 2022-07-14 DIAGNOSIS — M8589 Other specified disorders of bone density and structure, multiple sites: Secondary | ICD-10-CM | POA: Diagnosis not present

## 2022-07-29 DIAGNOSIS — R140 Abdominal distension (gaseous): Secondary | ICD-10-CM | POA: Diagnosis not present

## 2022-07-29 DIAGNOSIS — K769 Liver disease, unspecified: Secondary | ICD-10-CM | POA: Diagnosis not present

## 2022-08-11 ENCOUNTER — Telehealth: Payer: Self-pay | Admitting: Family Medicine

## 2022-08-11 NOTE — Telephone Encounter (Signed)
Contacted Eula Fried to schedule their annual wellness visit. Appointment made for 09/10/2022.  Sherol Dade; Care Guide [facility] Direct Dial: [phone]

## 2022-08-28 ENCOUNTER — Other Ambulatory Visit: Payer: Self-pay | Admitting: Pharmacist

## 2022-08-28 NOTE — Patient Instructions (Signed)
Check your blood pressure twice weekly, and any time you have concerning symptoms like headache, chest pain, dizziness, shortness of breath, or vision changes.   Our goal is less than 130/80.  To appropriately check your blood pressure, make sure you do the following:  1) Avoid caffeine, exercise, or tobacco products for 30 minutes before checking. Empty your bladder. 2) Sit with your back supported in a flat-backed chair. Rest your arm on something flat (arm of the chair, table, etc). 3) Sit still with your feet flat on the floor, resting, for at least 5 minutes.  4) Check your blood pressure. Take 1-2 readings.  5) Write down these readings and bring with you to any provider appointments.  Bring your home blood pressure machine with you to a provider's office for accuracy comparison at least once a year.   Make sure you take your blood pressure medications before you come to any office visit, even if you were asked to fast for labs.  Demarian Epps Tobechukwu Emmick, PharmD, BCACP Clinical Pharmacist [facility] [place] [phone]  

## 2022-08-28 NOTE — Progress Notes (Signed)
    Outreach Note  08/28/2022 Name: [name] MRN: [id number] DOB: May 10, 1956  I connected with Kendra Allison on 08/28/22 by telephone outreach and verified that I am speaking with the correct person using two identifiers.  Patient appearing on report for [place] Metric Hypertension Control due to last documented ambulatory blood pressure of 153/76 on 04/23/2023. Next appointment with PCP is 09/10/2022.   Outreached patient to discuss hypertension control and medication management.   Today patient reports has been unable to check her home blood pressure recently as her monitor is broken, but planning to obtain a new monitor.  Patient interested in discussing blood pressure further, but unable to talk further today.   Follow Up Plan: CM Pharmacist will outreach to patient by telephone again on 08/31/2022 at 8:30 am  Estelle Grumbles, PharmD, [facility] Clinical Pharmacist [facility] [phone]

## 2022-08-31 ENCOUNTER — Ambulatory Visit: Payer: Medicare PPO | Admitting: Pharmacist

## 2022-08-31 DIAGNOSIS — I10 Essential (primary) hypertension: Secondary | ICD-10-CM

## 2022-08-31 NOTE — Progress Notes (Unsigned)
Outreach Note  08/31/2022 Name: Kendra Allison MRN: [id number] DOB: May 26, 1955  I connected with Kendra Allison on 08/31/22 by telephone outreach and verified that I am speaking with the correct person using two identifiers.  Patient appearing on report for [place] Metric Hypertension Control due to last documented ambulatory blood pressure of 156/92 on 04/02/2022. Next appointment with PCP is 09/11/2022.   Outreached patient to discuss hypertension control and medication management.   Outpatient Encounter Medications as of 08/31/2022  Medication Sig   amLODipine (NORVASC) 10 MG tablet TAKE 1 TABLET EVERY DAY   hydrochlorothiazide (HYDRODIURIL) 25 MG tablet TAKE 1 TABLET(25 MG) BY MOUTH DAILY   albuterol (VENTOLIN HFA) 108 (90 Base) MCG/ACT inhaler Inhale 2 puffs into the lungs every 4 (four) hours as needed for wheezing or shortness of breath (cough).   celecoxib (CELEBREX) 200 MG capsule Take 200 mg by mouth 2 (two) times daily as needed.   dicyclomine (BENTYL) 10 MG capsule Take 1 capsule (10 mg total) by mouth 3 (three) times daily before meals. As needed for abdominal pain cramping bloating   famotidine (PEPCID) 20 MG tablet Take by mouth.   fluticasone (FLONASE) 50 MCG/ACT nasal spray Place 2 sprays into both nostrils daily as needed for allergies.   montelukast (SINGULAIR) 10 MG tablet TAKE 1 TABLET(10 MG) BY MOUTH AT BEDTIME (Patient taking differently: as needed.)   Multiple Vitamin (MULTIVITAMIN WITH MINERALS) TABS tablet Take 1 tablet by mouth daily. One-A-Day Multivitamin   pantoprazole (PROTONIX) 40 MG tablet Take 1 tablet (40 mg total) by mouth 2 (two) times daily before a meal.   rosuvastatin (CRESTOR) 10 MG tablet TAKE 1 TABLET AT BEDTIME   SYMBICORT 160-4.5 MCG/ACT inhaler Inhale 2 puffs into the lungs in the morning and at bedtime.   Vibegron (GEMTESA) 75 MG TABS Take 75 mg by mouth daily.   zolpidem (AMBIEN CR) 6.25 MG CR tablet Take 1 tablet (6.25 mg total) by mouth  at bedtime as needed for sleep.   [DISCONTINUED] enoxaparin (LOVENOX) 40 MG/0.4ML injection Inject 0.4 mLs (40 mg total) into the skin daily for 28 days. (Patient not taking: Reported on 06/11/2020)   [DISCONTINUED] gabapentin (NEURONTIN) 100 MG capsule Take 1 capsule (100 mg total) by mouth 3 (three) times daily. (Patient not taking: No sig reported)   No facility-administered encounter medications on file as of 08/31/2022.    Lab Results  Component Value Date   CREATININE 0.91 03/19/2022   BUN 14 03/19/2022   NA 140 03/19/2022   K 4.1 03/19/2022   CL 102 03/19/2022   CO2 30 03/19/2022    BP Readings from Last 3 Encounters:  04/22/22 (!) 153/76  04/02/22 (!) 156/92  03/19/22 (!) 146/90    Pulse Readings from Last 3 Encounters:  04/22/22 76  04/02/22 82  03/19/22 84    Current medications: Amlodipine 10 mg daily; HCTZ 25 mg daily  Reports has some flushing and swelling in her ankles since started amlodipine - Reports wears compression socks during day when away from home and elevates her legs when needed  Previous therapies tried: losartan (nausea/stuffy nose)  Home Monitoring: Patient does not have an automated upper arm home BP machine as last monitor broke Current blood pressure readings: Reports unable to check home blood pressure recently   Admits to adding some salt to food, but reports working on cutting back by: - Planning to obtain salt-free seasoning - Reduced intake of Bologna and hot dogs - Eating less canned foods,  more fresh or frozen  Current physical activity: Regular exercise with Silver sneakers 3-4 days/week and stretches at home and yoga   Assessment/Plan: - Currently uncontrolled - Reviewed goal blood pressure <130/80 - Reviewed appropriate administration of medication regimen - Counseled on long term microvascular and macrovascular complications of uncontrolled hypertension - Reviewed appropriate home BP monitoring technique (avoid caffeine,  smoking, and exercise for 30 minutes before checking, rest for at least 5 minutes before taking BP, sit with feet flat on the floor and back against a hard surface, uncross legs, and rest arm on flat surface) - Encourage patient to obtain new upper arm blood pressure monitor, restart checking blood pressure, document, and provide at next provider visit - Discussed dietary modifications, such as reduced salt intake, focus on whole grains, vegetables, lean proteins - Will collaborate with PCP   Follow Up Plan:   Patient denies further medication questions or concerns today Provide patient with contact information for clinic pharmacist to contact if needed in future for medication questions/concerns   Kendra Allison, PharmD, [facility] Clinical Pharmacist [facility] [phone]

## 2022-09-01 NOTE — Patient Instructions (Signed)
Check your blood pressure once daily, and any time you have concerning symptoms like headache, chest pain, dizziness, shortness of breath, or vision changes.   Our goal is less than 130/80.  To appropriately check your blood pressure, make sure you do the following:  1) Avoid caffeine, exercise, or tobacco products for 30 minutes before checking. Empty your bladder. 2) Sit with your back supported in a flat-backed chair. Rest your arm on something flat (arm of the chair, table, etc). 3) Sit still with your feet flat on the floor, resting, for at least 5 minutes.  4) Check your blood pressure. Take 1-2 readings.  5) Write down these readings and bring with you to any provider appointments.  Bring your home blood pressure machine with you to a provider's office for accuracy comparison at least once a year.   Make sure you take your blood pressure medications before you come to any office visit, even if you were asked to fast for labs.  Katyra Tomassetti Emerie Vanderkolk, PharmD, BCACP Clinical Pharmacist [facility] [phone]  

## 2022-09-10 ENCOUNTER — Ambulatory Visit (INDEPENDENT_AMBULATORY_CARE_PROVIDER_SITE_OTHER): Payer: Medicare PPO

## 2022-09-10 VITALS — Ht 62.0 in | Wt 173.0 lb

## 2022-09-10 DIAGNOSIS — Z0000 Encounter for general adult medical examination without abnormal findings: Secondary | ICD-10-CM | POA: Diagnosis not present

## 2022-09-10 NOTE — Patient Instructions (Signed)
Kendra Allison , Thank you for taking time to come for your Medicare Wellness Visit. I appreciate your ongoing commitment to your health goals. Please review the following plan we discussed and let me know if I can assist you in the future.   These are the goals we discussed:  Goals      DIET - EAT MORE FRUITS AND VEGETABLES        This is a list of the screening recommended for you and due dates:  Health Maintenance  Topic Date Due   Mammogram  05/16/2020   COVID-19 Vaccine (5 - 2023-24 season) 01/23/2022   Flu Shot  12/24/2022   Medicare Annual Wellness Visit  09/10/2023   Colon Cancer Screening  08/09/2024   DTaP/Tdap/Td vaccine (2 - Td or Tdap) 04/07/2030   Pneumonia Vaccine  Completed   DEXA scan (bone density measurement)  Completed   Hepatitis C Screening: USPSTF Recommendation to screen - Ages 80-79 yo.  Completed   Zoster (Shingles) Vaccine  Completed   HPV Vaccine  Aged Out    Advanced directives: NO  Conditions/risks identified: NONE  Next appointment: Follow up in one year for your annual wellness visit 09/16/23 @ 2:00 PM BY PHONE   Preventive Care 65 Years and Older, Female Preventive care refers to lifestyle choices and visits with your health care provider that can promote health and wellness. What does preventive care include? A yearly physical exam. This is also called an annual well check. Dental exams once or twice a year. Routine eye exams. Ask your health care provider how often you should have your eyes checked. Personal lifestyle choices, including: Daily care of your teeth and gums. Regular physical activity. Eating a healthy diet. Avoiding tobacco and drug use. Limiting alcohol use. Practicing safe sex. Taking low-dose aspirin every day. Taking vitamin and mineral supplements as recommended by your health care provider. What happens during an annual well check? The services and screenings done by your health care provider during your annual well  check will depend on your age, overall health, lifestyle risk factors, and family history of disease. Counseling  Your health care provider may ask you questions about your: Alcohol use. Tobacco use. Drug use. Emotional well-being. Home and relationship well-being. Sexual activity. Eating habits. History of falls. Memory and ability to understand (cognition). Work and work Astronomer. Reproductive health. Screening  You may have the following tests or measurements: Height, weight, and BMI. Blood pressure. Lipid and cholesterol levels. These may be checked every 5 years, or more frequently if you are over 22 years old. Skin check. Lung cancer screening. You may have this screening every year starting at age 66 if you have a 30-pack-year history of smoking and currently smoke or have quit within the past 15 years. Fecal occult blood test (FOBT) of the stool. You may have this test every year starting at age 75. Flexible sigmoidoscopy or colonoscopy. You may have a sigmoidoscopy every 5 years or a colonoscopy every 10 years starting at age 24. Hepatitis C blood test. Hepatitis B blood test. Sexually transmitted disease (STD) testing. Diabetes screening. This is done by checking your blood sugar (glucose) after you have not eaten for a while (fasting). You may have this done every 1-3 years. Bone density scan. This is done to screen for osteoporosis. You may have this done starting at age 51. Mammogram. This may be done every 1-2 years. Talk to your health care provider about how often you should have regular mammograms.  Talk with your health care provider about your test results, treatment options, and if necessary, the need for more tests. Vaccines  Your health care provider may recommend certain vaccines, such as: Influenza vaccine. This is recommended every year. Tetanus, diphtheria, and acellular pertussis (Tdap, Td) vaccine. You may need a Td booster every 10 years. Zoster  vaccine. You may need this after age 34. Pneumococcal 13-valent conjugate (PCV13) vaccine. One dose is recommended after age 69. Pneumococcal polysaccharide (PPSV23) vaccine. One dose is recommended after age 61. Talk to your health care provider about which screenings and vaccines you need and how often you need them. This information is not intended to replace advice given to you by your health care provider. Make sure you discuss any questions you have with your health care provider. Document Released: 06/07/2015 Document Revised: 01/29/2016 Document Reviewed: 03/12/2015 Elsevier Interactive Patient Education  2017 [place] Prevention in the Home Falls can cause injuries. They can happen to people of all ages. There are many things you can do to make your home safe and to help prevent falls. What can I do on the outside of my home? Regularly fix the edges of walkways and driveways and fix any cracks. Remove anything that might make you trip as you walk through a door, such as a raised step or threshold. Trim any bushes or trees on the path to your home. Use bright outdoor lighting. Clear any walking paths of anything that might make someone trip, such as rocks or tools. Regularly check to see if handrails are loose or broken. Make sure that both sides of any steps have handrails. Any raised decks and porches should have guardrails on the edges. Have any leaves, snow, or ice cleared regularly. Use sand or salt on walking paths during winter. Clean up any spills in your garage right away. This includes oil or grease spills. What can I do in the bathroom? Use night lights. Install grab bars by the toilet and in the tub and shower. Do not use towel bars as grab bars. Use non-skid mats or decals in the tub or shower. If you need to sit down in the shower, use a plastic, non-slip stool. Keep the floor dry. Clean up any water that spills on the floor as soon as it happens. Remove  soap buildup in the tub or shower regularly. Attach bath mats securely with double-sided non-slip rug tape. Do not have throw rugs and other things on the floor that can make you trip. What can I do in the bedroom? Use night lights. Make sure that you have a light by your bed that is easy to reach. Do not use any sheets or blankets that are too big for your bed. They should not hang down onto the floor. Have a firm chair that has side arms. You can use this for support while you get dressed. Do not have throw rugs and other things on the floor that can make you trip. What can I do in the kitchen? Clean up any spills right away. Avoid walking on wet floors. Keep items that you use a lot in easy-to-reach places. If you need to reach something above you, use a strong step stool that has a grab bar. Keep electrical cords out of the way. Do not use floor polish or wax that makes floors slippery. If you must use wax, use non-skid floor wax. Do not have throw rugs and other things on the floor that can make  you trip. What can I do with my stairs? Do not leave any items on the stairs. Make sure that there are handrails on both sides of the stairs and use them. Fix handrails that are broken or loose. Make sure that handrails are as long as the stairways. Check any carpeting to make sure that it is firmly attached to the stairs. Fix any carpet that is loose or worn. Avoid having throw rugs at the top or bottom of the stairs. If you do have throw rugs, attach them to the floor with carpet tape. Make sure that you have a light switch at the top of the stairs and the bottom of the stairs. If you do not have them, ask someone to add them for you. What else can I do to help prevent falls? Wear shoes that: Do not have high heels. Have rubber bottoms. Are comfortable and fit you well. Are closed at the toe. Do not wear sandals. If you use a stepladder: Make sure that it is fully opened. Do not climb a  closed stepladder. Make sure that both sides of the stepladder are locked into place. Ask someone to hold it for you, if possible. Clearly mark and make sure that you can see: Any grab bars or handrails. First and last steps. Where the edge of each step is. Use tools that help you move around (mobility aids) if they are needed. These include: Canes. Walkers. Scooters. Crutches. Turn on the lights when you go into a dark area. Replace any light bulbs as soon as they burn out. Set up your furniture so you have a clear path. Avoid moving your furniture around. If any of your floors are uneven, fix them. If there are any pets around you, be aware of where they are. Review your medicines with your doctor. Some medicines can make you feel dizzy. This can increase your chance of falling. Ask your doctor what other things that you can do to help prevent falls. This information is not intended to replace advice given to you by your health care provider. Make sure you discuss any questions you have with your health care provider. Document Released: 03/07/2009 Document Revised: 10/17/2015 Document Reviewed: 06/15/2014 Elsevier Interactive Patient Education  2017 [employer].

## 2022-09-10 NOTE — Progress Notes (Signed)
I connected with  Kendra Allison on 09/10/22 by a audio enabled telemedicine application and verified that I am speaking with the correct person using two identifiers.  Patient Location: Home  Provider Location: Office/Clinic  I discussed the limitations of evaluation and management by telemedicine. The patient expressed understanding and agreed to proceed.  Subjective:   Kendra Allison is a 67 y.o. female who presents for Medicare Annual (Subsequent) preventive examination.  Review of Systems     Cardiac Risk Factors include: advanced age (>3men, >33 women);hypertension     Objective:    Today's Vitals   09/10/22 1402  PainSc: 3    There is no height or weight on file to calculate BMI.     09/10/2022    2:10 PM 07/09/2020    9:19 AM 06/13/2020   10:31 AM 04/10/2020    9:30 AM 04/07/2020   10:30 PM 08/10/2019    7:16 AM  Advanced Directives  Does Patient Have a Medical Advance Directive? No No No No No No  Would patient like information on creating a medical advance directive?  No - Patient declined No - Patient declined No - Patient declined No - Patient declined     Current Medications (verified) Outpatient Encounter Medications as of 09/10/2022  Medication Sig   albuterol (VENTOLIN HFA) 108 (90 Base) MCG/ACT inhaler Inhale 2 puffs into the lungs every 4 (four) hours as needed for wheezing or shortness of breath (cough).   amLODipine (NORVASC) 10 MG tablet TAKE 1 TABLET EVERY DAY   calcium-vitamin D (OSCAL WITH D) 500-5 MG-MCG tablet Take 1 tablet by mouth.   dicyclomine (BENTYL) 10 MG capsule Take 1 capsule (10 mg total) by mouth 3 (three) times daily before meals. As needed for abdominal pain cramping bloating   estradiol (ESTRACE) 0.1 MG/GM vaginal cream Place 1 Applicatorful vaginally. Twice weekly   famotidine (PEPCID) 20 MG tablet Take 20 mg by mouth 2 (two) times daily.   fluticasone (FLONASE) 50 MCG/ACT nasal spray Place 2 sprays into both nostrils daily as  needed for allergies.   hydrochlorothiazide (HYDRODIURIL) 25 MG tablet TAKE 1 TABLET(25 MG) BY MOUTH DAILY   Multiple Vitamin (MULTIVITAMIN WITH MINERALS) TABS tablet Take 1 tablet by mouth daily. One-A-Day Multivitamin   rosuvastatin (CRESTOR) 10 MG tablet TAKE 1 TABLET AT BEDTIME   SYMBICORT 160-4.5 MCG/ACT inhaler Inhale 2 puffs into the lungs in the morning and at bedtime.   celecoxib (CELEBREX) 200 MG capsule Take 200 mg by mouth 2 (two) times daily as needed. (Patient not taking: Reported on 08/31/2022)   pantoprazole (PROTONIX) 40 MG tablet Take 1 tablet (40 mg total) by mouth 2 (two) times daily before a meal. (Patient not taking: Reported on 08/31/2022)   [DISCONTINUED] enoxaparin (LOVENOX) 40 MG/0.4ML injection Inject 0.4 mLs (40 mg total) into the skin daily for 28 days. (Patient not taking: Reported on 06/11/2020)   [DISCONTINUED] gabapentin (NEURONTIN) 100 MG capsule Take 1 capsule (100 mg total) by mouth 3 (three) times daily. (Patient not taking: No sig reported)   [DISCONTINUED] melatonin 5 MG TABS Take 5 mg by mouth at bedtime as needed.   [DISCONTINUED] montelukast (SINGULAIR) 10 MG tablet TAKE 1 TABLET(10 MG) BY MOUTH AT BEDTIME (Patient not taking: Reported on 08/31/2022)   No facility-administered encounter medications on file as of 09/10/2022.    Allergies (verified) Patient has no known allergies.   History: Past Medical History:  Diagnosis Date   Asthma    GERD (gastroesophageal reflux disease)  Hypercholesteremia    Hyperlipidemia    Hypertension    Osteoporosis    Past Surgical History:  Procedure Laterality Date   ABDOMINAL HYSTERECTOMY     BREAST BIOPSY     COLONOSCOPY WITH PROPOFOL N/A 08/10/2019   Procedure: COLONOSCOPY WITH PROPOFOL;  Surgeon: Wyline Mood, MD;  Location: [facility] ENDOSCOPY;  Service: Gastroenterology;  Laterality: N/A;   ESOPHAGOGASTRODUODENOSCOPY (EGD) WITH PROPOFOL N/A 08/10/2019   Procedure: ESOPHAGOGASTRODUODENOSCOPY (EGD) WITH PROPOFOL;   Surgeon: Wyline Mood, MD;  Location: [facility] ENDOSCOPY;  Service: Gastroenterology;  Laterality: N/A;   EXTERNAL FIXATION LEG Left 04/07/2020    EXTERNAL FIXATION LEG (Left Leg Lower)   EXTERNAL FIXATION LEG Left 04/07/2020   Procedure: EXTERNAL FIXATION LEG;  Surgeon: Roby Lofts, MD;  Location: [facility] OR;  Service: Orthopedics;  Laterality: Left;   EXTERNAL FIXATION REMOVAL Left 04/10/2020   Procedure: REMOVAL EXTERNAL FIXATION LEG;  Surgeon: Roby Lofts, MD;  Location: [facility] OR;  Service: Orthopedics;  Laterality: Left;   I & D EXTREMITY Left 04/07/2020   Procedure: IRRIGATION AND DEBRIDEMENT EXTREMITY;  Surgeon: Roby Lofts, MD;  Location: [facility] OR;  Service: Orthopedics;  Laterality: Left;   I & D EXTREMITY Left 06/12/2020   Procedure: IRRIGATION AND DEBRIDEMENT EXTREMITY;  Surgeon: Roby Lofts, MD;  Location: [facility] OR;  Service: Orthopedics;  Laterality: Left;   Nissem  2014   TIBIA IM NAIL INSERTION Left 04/10/2020   Procedure: INTRAMEDULLARY (IM) NAIL TIBIAL;  Surgeon: Roby Lofts, MD;  Location: [facility] OR;  Service: Orthopedics;  Laterality: Left;   History reviewed. No pertinent family history. Social History   Socioeconomic History   Marital status: Single    Spouse name: Not on file   Number of children: 0   Years of education: Not on file   Highest education level: Not on file  Occupational History   Occupation: Retired  Tobacco Use   Smoking status: Never   Smokeless tobacco: Never   Tobacco comments:    Quit 40years ago.   Vaping Use   Vaping Use: Never used  Substance and Sexual Activity   Alcohol use: Yes    Comment: "rarely"    Drug use: Never   Sexual activity: Not Currently  Other Topics Concern   Not on file  Social History Narrative   ** Merged History Encounter **       Social Determinants of Health   Financial Resource Strain: Low Risk  (09/10/2022)   Overall Financial Resource Strain (CARDIA)    Difficulty of Paying Living Expenses: Not hard at  all  Food Insecurity: No Food Insecurity (09/10/2022)   Hunger Vital Sign    Worried About Running Out of Food in the Last Year: Never true    Ran Out of Food in the Last Year: Never true  Transportation Needs: No Transportation Needs (09/10/2022)   PRAPARE - Administrator, Civil Service (Medical): No    Lack of Transportation (Non-Medical): No  Physical Activity: Sufficiently Active (09/10/2022)   Exercise Vital Sign    Days of Exercise per Week: 4 days    Minutes of Exercise per Session: 60 min  Stress: No Stress Concern Present (09/10/2022)   [employer] of Occupational Health - Occupational Stress Questionnaire    Feeling of Stress : Not at all  Social Connections: Moderately Integrated (09/10/2022)   Social Connection and Isolation Panel [NHANES]    Frequency of Communication with Friends and Family: More than three times a week  Frequency of Social Gatherings with Friends and Family: Twice a week    Attends Religious Services: More than 4 times per year    Active Member of [employer] or Organizations: Yes    Attends Engineer, structural: More than 4 times per year    Marital Status: Never married    Tobacco Counseling Counseling given: Not Answered Tobacco comments: Quit 40years ago.    Clinical Intake:  Pre-visit preparation completed: Yes  Pain : 0-10 Pain Score: 3  Pain Type: Chronic pain Pain Location: Ankle Pain Orientation: Left     Nutritional Risks: None Diabetes: No  How often do you need to have someone help you when you read instructions, pamphlets, or other written materials from your doctor or pharmacy?: 1 - Never  Diabetic?NO  Interpreter Needed?: No  Information entered by :: Kennedy Bucker, LPN   Activities of Daily Living    09/10/2022    2:12 PM 09/10/2022    9:18 AM  In your present state of health, do you have any difficulty performing the following activities:  Hearing? 0 0  Vision? 0 0  Difficulty concentrating  or making decisions? 0 0  Walking or climbing stairs? 0 0  Dressing or bathing? 0 0  Doing errands, shopping? 0 0  Preparing Food and eating ? N N  Using the Toilet? N N  In the past six months, have you accidently leaked urine? N N  Do you have problems with loss of bowel control? N N  Managing your Medications? N N  Managing your Finances? N N  Housekeeping or managing your Housekeeping? N N    Patient Care Team: Smitty Cords, DO as PCP - General (Family Medicine) Althea Charon Netta Neat, DO (Family Medicine)  Indicate any recent Medical Services you may have received from other than [facility] providers in the past year (date may be approximate).     Assessment:   This is a routine wellness examination for Kendra Allison.  Hearing/Vision screen Hearing Screening - Comments:: NO AIDS Vision Screening - Comments:: WEARS GLASSES= DR.VIRNIG  Dietary issues and exercise activities discussed: Current Exercise Habits: Home exercise routine, Type of exercise: walking, Time (Minutes): 60, Frequency (Times/Week): 4, Weekly Exercise (Minutes/Week): 240, Intensity: Mild   Goals Addressed             This Visit's Progress    DIET - EAT MORE FRUITS AND VEGETABLES         Depression Screen    09/10/2022    2:08 PM 03/19/2022    8:19 AM 09/08/2021    8:11 AM 08/19/2021    8:26 AM 02/13/2021    2:52 PM 10/02/2019   11:40 AM 03/23/2019    9:24 AM  PHQ 2/9 Scores  PHQ - 2 Score 0 0 0 0 0 0 0  PHQ- 9 Score 0 0    Fall Risk    09/10/2022    2:12 PM 09/10/2022    9:18 AM 03/19/2022    8:19 AM 09/08/2021    8:08 AM 08/19/2021    8:25 AM  Fall Risk   Falls in the past year? 0 0 0 0 0  Number falls in past yr: 0  0 0 0  Injury with Fall? 0 0 0 0 0  Risk for fall due to : No Fall Risks  No Fall Risks No Fall Risks No Fall Risks  Follow up Falls prevention discussed;Falls evaluation completed  Falls evaluation completed Falls  evaluation completed Falls evaluation completed     FALL RISK PREVENTION PERTAINING TO THE HOME:  Any stairs in or around the home? No  If so, are there any without handrails? No  Home free of loose throw rugs in walkways, pet beds, electrical cords, etc? Yes  Adequate lighting in your home to reduce risk of falls? Yes   ASSISTIVE DEVICES UTILIZED TO PREVENT FALLS:  Life alert? No  Use of a cane, walker or w/c? No  Grab bars in the bathroom? No  Shower chair or bench in shower? Yes  Elevated toilet seat or a handicapped toilet? No    Cognitive Function:        09/10/2022    2:17 PM 09/08/2021    8:12 AM  6CIT Screen  What Year? 0 points 0 points  What month? 0 points 0 points  What time? 0 points 0 points  Count back from 20 0 points 0 points  Months in reverse 0 points 0 points  Repeat phrase 0 points 0 points  Total Score 0 points 0 points    Immunizations Immunization History  Administered Date(s) Administered   Fluad Quad(high Dose 65+) 03/19/2022   Influenza,inj,Quad PF,6+ Mos 03/01/2019, 03/04/2020, 02/13/2021   Moderna Covid-19 Vaccine Bivalent Booster 29yrs & up 03/27/2021   PFIZER Comirnaty(Gray Top)Covid-19 Tri-Sucrose Vaccine 06/13/2020   PFIZER(Purple Top)SARS-COV-2 Vaccination 08/23/2019, 09/20/2019   PNEUMOCOCCAL CONJUGATE-20 09/08/2021   Tdap 04/07/2020   Zoster Recombinat (Shingrix) 03/27/2021, 05/29/2021    TDAP status: Up to date  Flu Vaccine status: Up to date  Pneumococcal vaccine status: Up to date  Covid-19 vaccine status: Completed vaccines  Qualifies for Shingles Vaccine? Yes   Zostavax completed No   Shingrix Completed?: Yes  Screening Tests Health Maintenance  Topic Date Due   MAMMOGRAM  05/16/2020   COVID-19 Vaccine (5 - 2023-24 season) 01/23/2022   INFLUENZA VACCINE  12/24/2022   Medicare Annual Wellness (AWV)  09/10/2023   COLONOSCOPY (Pts 45-83yrs Insurance coverage will need to be confirmed)  08/09/2024   DTaP/Tdap/Td (2 - Td or Tdap) 04/07/2030   Pneumonia Vaccine  35+ Years old  Completed   DEXA SCAN  Completed   Hepatitis C Screening  Completed   Zoster Vaccines- Shingrix  Completed   HPV VACCINES  Aged Out    Health Maintenance  Health Maintenance Due  Topic Date Due   MAMMOGRAM  05/16/2020   COVID-19 Vaccine (5 - 2023-24 season) 01/23/2022    Colorectal cancer screening: Type of screening: Colonoscopy. Completed 9/23. Repeat every 3 years  Mammogram status: Ordered 03/05/22. Pt provided with contact info and advised to call to schedule appt. - HAD ONE ON 06/05/22 THIS YEAR  Bone Density status: Completed 07/14/22. Results reflect: Bone density results: OSTEOPENIA. Repeat every 5 years.  Lung Cancer Screening: (Low Dose CT Chest recommended if Age 66-80 years, 30 pack-year currently smoking OR have quit w/in 15years.) does not qualify.    Additional Screening:  Hepatitis C Screening: does qualify; Completed 03/16/19  Vision Screening: Recommended annual ophthalmology exams for early detection of glaucoma and other disorders of the eye. Is the patient up to date with their annual eye exam?  Yes  Who is the provider or what is the name of the office in which the patient attends annual eye exams? DR.VIRNIG If pt is not established with a provider, would they like to be referred to a provider to establish care? No .   Dental Screening: Recommended annual dental exams for proper oral  hygiene  Community Resource Referral / Chronic Care Management: CRR required this visit?  No   CCM required this visit?  No      Plan:     I have personally reviewed and noted the following in the patient's chart:   Medical and social history Use of alcohol, tobacco or illicit drugs  Current medications and supplements including opioid prescriptions. Patient is not currently taking opioid prescriptions. Functional ability and status Nutritional status Physical activity Advanced directives List of other physicians Hospitalizations, surgeries, and  ER visits in previous 12 months Vitals Screenings to include cognitive, depression, and falls Referrals and appointments  In addition, I have reviewed and discussed with patient certain preventive protocols, quality metrics, and best practice recommendations. A written personalized care plan for preventive services as well as general preventive health recommendations were provided to patient.     Hal Hope, LPN   [id number]   Nurse Notes: [facility]

## 2022-09-11 ENCOUNTER — Ambulatory Visit (INDEPENDENT_AMBULATORY_CARE_PROVIDER_SITE_OTHER): Payer: Medicare PPO | Admitting: Family Medicine

## 2022-09-11 ENCOUNTER — Encounter: Payer: Self-pay | Admitting: Family Medicine

## 2022-09-11 ENCOUNTER — Other Ambulatory Visit: Payer: Self-pay | Admitting: Family Medicine

## 2022-09-11 VITALS — BP 138/88 | HR 66 | Resp 16 | Ht 62.0 in | Wt 171.2 lb

## 2022-09-11 DIAGNOSIS — R0789 Other chest pain: Secondary | ICD-10-CM

## 2022-09-11 DIAGNOSIS — Z8781 Personal history of (healed) traumatic fracture: Secondary | ICD-10-CM

## 2022-09-11 DIAGNOSIS — M19172 Post-traumatic osteoarthritis, left ankle and foot: Secondary | ICD-10-CM

## 2022-09-11 DIAGNOSIS — I10 Essential (primary) hypertension: Secondary | ICD-10-CM

## 2022-09-11 DIAGNOSIS — R0609 Other forms of dyspnea: Secondary | ICD-10-CM | POA: Diagnosis not present

## 2022-09-11 DIAGNOSIS — E7800 Pure hypercholesterolemia, unspecified: Secondary | ICD-10-CM

## 2022-09-11 DIAGNOSIS — Z0000 Encounter for general adult medical examination without abnormal findings: Secondary | ICD-10-CM

## 2022-09-11 DIAGNOSIS — K219 Gastro-esophageal reflux disease without esophagitis: Secondary | ICD-10-CM

## 2022-09-11 DIAGNOSIS — R7309 Other abnormal glucose: Secondary | ICD-10-CM | POA: Diagnosis not present

## 2022-09-11 MED ORDER — HYDROCHLOROTHIAZIDE 25 MG PO TABS: 25.0000 mg | ORAL_TABLET | Freq: Every day | ORAL | 3 refills | Status: AC

## 2022-09-11 MED ORDER — VALSARTAN 80 MG PO TABS: 80.0000 mg | ORAL_TABLET | Freq: Every day | ORAL | 0 refills | Status: DC

## 2022-09-11 MED ORDER — CELECOXIB 200 MG PO CAPS: 200.0000 mg | ORAL_CAPSULE | Freq: Two times a day (BID) | ORAL | 3 refills | Status: DC | PRN

## 2022-09-11 NOTE — Assessment & Plan Note (Signed)
Elevated BP now off med No known complications  Off Losartan 50, Off Amlodipine 10   Plan:  Switch to other ARB Valsartan  daily Continue HCTZ   Encourage improved lifestyle - low sodium diet, regular exercise Continue monitor BP outside office, bring readings to next visit, if persistently >140/90 or new symptoms notify office sooner  Upcoming visit with Cardiology

## 2022-09-11 NOTE — Assessment & Plan Note (Signed)
Controlled cholesterol on statin lifestyle Last lipid panel 08/2022 The 10-year ASCVD risk score (Arnett DK, et al., 2019) is: 7.8%  Plan: 1. Continue current meds - Rosuvastatin  2. Encourage improved lifestyle - low carb/cholesterol, reduce portion size, continue improving regular exercise Follow-up

## 2022-09-11 NOTE — Patient Instructions (Addendum)
Thank you for coming to the office today.  Re ordered Celebrex as needed for ankle/foot.  Stop Amlodipine  due to side effects Start Valsartan  daily  Referral sent.  [facility]) HeartCare at [facility] [address] [place], [place] [address] Main: [phone]   If you have any significant chest pain that does not go away within 30 minutes, is accompanied by nausea, sweating, shortness of breath, or made worse by activity, this may be evidence of a heart attack, especially if symptoms worsening instead of improving, please call 911 or go directly to the emergency room immediately for evaluation.   Please schedule a Follow-up Appointment to: Return in about 6 months (around 03/13/2023) for 6 month Follow-up HTN, updates.  If you have any other questions or concerns, please feel free to call the office or send a message through MyChart. You may also schedule an earlier appointment if necessary.  Additionally, you may be receiving a survey about your experience at our office within a few days to 1 week by e-mail or mail. We value your feedback.  Saralyn Pilar, DO [facility], [place]

## 2022-09-11 NOTE — Progress Notes (Signed)
Subjective:    Patient ID: Kendra Allison, female    DOB: Oct 27, 1955, 67 y.o.   MRN: [id number]  Kendra Allison is a 67 y.o. female presenting on 09/11/2022 for Annual Exam   HPI  Here for Annual Physical and due for fasting labs.   GERD / History of positive H Pylori   PMH Chronic sinusitis / Eustachian tube dysfunction  CHRONIC HTN: Elevated BP off med Asking to see Cardiologist Came off Losartan , now off Amlodipine due to side effect swelling Current Meds - Hydrochlorothiazide  daily Reports good compliance, took meds today. Tolerating well, w/o complaints. Denies CP, dyspnea, HA, edema, dizziness / lightheadedness   Hyperlipidemia Last labs normal range on cholestrol 08/2021 On Rosuvastatin,  Due for lab   History of Asthma On Symbicort 2 puffs twice a day will let us know when ready Refill Albuterol PRN   Anemia She is taking iron pills / beet juice.  Lab showed CBC normal range without anemia.     Vitamin B12 Deficiency On B12 1000 daily with good improvement on treatment. Now B12 lvl > 2000   Vitamin D normal   A1c 5.7 slightly elevated  GERD [facility] has done EGD Already had CT They recommended MRI next  Eye Doctor Followed by [facility], last seen 08/03/22, on eye drops and warm compresses.  Podiatry S/p L Ankle repair with hardware Worse with swelling on Amlodipine     Additional updates   Insomnia Poor Sleep  Tried Melatonin and Hydroxyzine.   Health Maintenance:  UTD COVID19 vaccine series. Including booster.   Last colonoscopy done [facility] Dr Tobi Bastos 08/10/19 - polyp, repeat in 5 years. Next 2026   Mammogram done at [facility], negative. Will copy today   Has had DEXA and Pap Smear done as well, requesting copy.   Seborrheic Dermatitis improved w/ triamcinolone.   Pneumonia vaccine - received Prevnar-20 today     09/10/2022    2:08 PM 03/19/2022    8:19 AM 09/08/2021    8:11 AM  Depression screen PHQ 2/9  Decreased  Interest 0 0 0  Down, Depressed, Hopeless 0 0 0  PHQ - 2 Score 0 0 0  Altered sleeping 0 2 0  Tired, decreased energy 0 3 3  Change in appetite 0 0 0  Feeling bad or failure about yourself  0 0 0  Trouble concentrating 0 0 0  Moving slowly or fidgety/restless 0 0 0  Suicidal thoughts 0 0 0  PHQ-9 Score 0 5 3  Difficult doing work/chores Not difficult at all Not difficult at all Not difficult at all    Past Medical History:  Diagnosis Date   Asthma    GERD (gastroesophageal reflux disease)    Hypercholesteremia    Hyperlipidemia    Hypertension    Osteoporosis    Past Surgical History:  Procedure Laterality Date   ABDOMINAL HYSTERECTOMY     BREAST BIOPSY     COLONOSCOPY WITH PROPOFOL N/A 08/10/2019   Procedure: COLONOSCOPY WITH PROPOFOL;  Surgeon: Wyline Mood, MD;  Location: [facility] ENDOSCOPY;  Service: Gastroenterology;  Laterality: N/A;   ESOPHAGOGASTRODUODENOSCOPY (EGD) WITH PROPOFOL N/A 08/10/2019   Procedure: ESOPHAGOGASTRODUODENOSCOPY (EGD) WITH PROPOFOL;  Surgeon: Wyline Mood, MD;  Location: [facility] ENDOSCOPY;  Service: Gastroenterology;  Laterality: N/A;   EXTERNAL FIXATION LEG Left 04/07/2020    EXTERNAL FIXATION LEG (Left Leg Lower)   EXTERNAL FIXATION LEG Left 04/07/2020   Procedure: EXTERNAL FIXATION LEG;  Surgeon: Roby Lofts, MD;  Location: [facility] OR;  Service: Orthopedics;  Laterality: Left;   EXTERNAL FIXATION REMOVAL Left 04/10/2020   Procedure: REMOVAL EXTERNAL FIXATION LEG;  Surgeon: Roby Lofts, MD;  Location: [facility] OR;  Service: Orthopedics;  Laterality: Left;   I & D EXTREMITY Left 04/07/2020   Procedure: IRRIGATION AND DEBRIDEMENT EXTREMITY;  Surgeon: Roby Lofts, MD;  Location: [facility] OR;  Service: Orthopedics;  Laterality: Left;   I & D EXTREMITY Left 06/12/2020   Procedure: IRRIGATION AND DEBRIDEMENT EXTREMITY;  Surgeon: Roby Lofts, MD;  Location: [facility] OR;  Service: Orthopedics;  Laterality: Left;   Nissem  2014   TIBIA IM NAIL INSERTION Left 04/10/2020    Procedure: INTRAMEDULLARY (IM) NAIL TIBIAL;  Surgeon: Roby Lofts, MD;  Location: [facility] OR;  Service: Orthopedics;  Laterality: Left;   Social History   Socioeconomic History   Marital status: Single    Spouse name: Not on file   Number of children: 0   Years of education: Not on file   Highest education level: Not on file  Occupational History   Occupation: Retired  Tobacco Use   Smoking status: Never   Smokeless tobacco: Never   Tobacco comments:    Quit 40years ago.   Vaping Use   Vaping Use: Never used  Substance and Sexual Activity   Alcohol use: Yes    Comment: "rarely"    Drug use: Never   Sexual activity: Not Currently  Other Topics Concern   Not on file  Social History Narrative   ** Merged History Encounter **       Social Determinants of Health   Financial Resource Strain: Low Risk  (09/10/2022)   Overall Financial Resource Strain (CARDIA)    Difficulty of Paying Living Expenses: Not hard at all  Food Insecurity: No Food Insecurity (09/10/2022)   Hunger Vital Sign    Worried About Running Out of Food in the Last Year: Never true    Ran Out of Food in the Last Year: Never true  Transportation Needs: No Transportation Needs (09/10/2022)   PRAPARE - Administrator, Civil Service (Medical): No    Lack of Transportation (Non-Medical): No  Physical Activity: Sufficiently Active (09/10/2022)   Exercise Vital Sign    Days of Exercise per Week: 4 days    Minutes of Exercise per Session: 60 min  Stress: No Stress Concern Present (09/10/2022)   [employer] of Occupational Health - Occupational Stress Questionnaire    Feeling of Stress : Not at all  Social Connections: Moderately Integrated (09/10/2022)   Social Connection and Isolation Panel [NHANES]    Frequency of Communication with Friends and Family: More than three times a week    Frequency of Social Gatherings with Friends and Family: Twice a week    Attends Religious Services: More than 4  times per year    Active Member of [employer] or Organizations: Yes    Attends Banker Meetings: More than 4 times per year    Marital Status: Never married  Intimate Partner Violence: Not At Risk (09/10/2022)   Humiliation, Afraid, Rape, and Kick questionnaire    Fear of Current or Ex-Partner: No    Emotionally Abused: No    Physically Abused: No    Sexually Abused: No   History reviewed. No pertinent family history. Current Outpatient Medications on File Prior to Visit  Medication Sig   albuterol (VENTOLIN HFA) 108 (90 Base) MCG/ACT inhaler Inhale 2 puffs into the lungs every  4 (four) hours as needed for wheezing or shortness of breath (cough).   calcium-vitamin D (OSCAL WITH D) 500-5 MG-MCG tablet Take 1 tablet by mouth.   dicyclomine (BENTYL) 10 MG capsule Take 1 capsule (10 mg total) by mouth 3 (three) times daily before meals. As needed for abdominal pain cramping bloating   estradiol (ESTRACE) 0.1 MG/GM vaginal cream Place 1 Applicatorful vaginally. Twice weekly   famotidine (PEPCID) 20 MG tablet Take 20 mg by mouth 2 (two) times daily.   fluticasone (FLONASE) 50 MCG/ACT nasal spray Place 2 sprays into both nostrils daily as needed for allergies.   Multiple Vitamin (MULTIVITAMIN WITH MINERALS) TABS tablet Take 1 tablet by mouth daily. One-A-Day Multivitamin   rosuvastatin (CRESTOR) 10 MG tablet TAKE 1 TABLET AT BEDTIME   SYMBICORT 160-4.5 MCG/ACT inhaler Inhale 2 puffs into the lungs in the morning and at bedtime.   [DISCONTINUED] enoxaparin (LOVENOX) 40 MG/0.4ML injection Inject 0.4 mLs (40 mg total) into the skin daily for 28 days. (Patient not taking: Reported on 06/11/2020)   [DISCONTINUED] gabapentin (NEURONTIN) 100 MG capsule Take 1 capsule (100 mg total) by mouth 3 (three) times daily. (Patient not taking: No sig reported)   No current facility-administered medications on file prior to visit.    Review of Systems  Constitutional:  Negative for activity change,  appetite change, chills, diaphoresis, fatigue and fever.  HENT:  Negative for congestion and hearing loss.   Eyes:  Negative for visual disturbance.  Respiratory:  Negative for cough, chest tightness, shortness of breath and wheezing.   Cardiovascular:  Negative for chest pain, palpitations and leg swelling.  Gastrointestinal:  Negative for abdominal pain, constipation, diarrhea, nausea and vomiting.  Genitourinary:  Negative for dysuria, frequency and hematuria.  Musculoskeletal:  Negative for arthralgias and neck pain.  Skin:  Negative for rash.  Neurological:  Negative for dizziness, weakness, light-headedness, numbness and headaches.  Hematological:  Negative for adenopathy.  Psychiatric/Behavioral:  Negative for behavioral problems, dysphoric mood and sleep disturbance.    Per HPI unless specifically indicated above     Objective:    BP 138/88 (BP Location: Right Arm, Patient Position: Sitting, Cuff Size: Normal)   Pulse 66   Resp 16   Ht 5\' 2"  (1.575 m)   Wt 171 lb 3.2 oz (77.7 kg)   BMI 31.31 kg/m   Wt Readings from Last 3 Encounters:  09/11/22 171 lb 3.2 oz (77.7 kg)  09/10/22 173 lb (78.5 kg)  04/22/22 171 lb (77.6 kg)    Physical Exam Vitals and nursing note reviewed.  Constitutional:      General: She is not in acute distress.    Appearance: She is well-developed. She is not diaphoretic.     Comments: Well-appearing, comfortable, cooperative  HENT:     Head: Normocephalic and atraumatic.  Eyes:     General:        Right eye: No discharge.        Left eye: No discharge.     Conjunctiva/sclera: Conjunctivae normal.     Pupils: Pupils are equal, round, and reactive to light.  Neck:     Thyroid: No thyromegaly.  Cardiovascular:     Rate and Rhythm: Normal rate and regular rhythm.     Pulses: Normal pulses.     Heart sounds: Normal heart sounds. No murmur heard. Pulmonary:     Effort: Pulmonary effort is normal. No respiratory distress.     Breath sounds:  Normal breath sounds. No wheezing or rales.  Abdominal:     General: Bowel sounds are normal. There is no distension.     Palpations: Abdomen is soft. There is no mass.     Tenderness: There is no abdominal tenderness.  Musculoskeletal:        General: No tenderness. Normal range of motion.     Cervical back: Normal range of motion and neck supple.     Right lower leg: No edema.     Left lower leg: No edema.     Comments: Upper / Lower Extremities: - Normal muscle tone, strength bilateral upper extremities 5/5, lower extremities 5/5  Lymphadenopathy:     Cervical: No cervical adenopathy.  Skin:    General: Skin is warm and dry.     Findings: No erythema or rash.  Neurological:     Mental Status: She is alert and oriented to person, place, and time.     Comments: Distal sensation intact to light touch all extremities  Psychiatric:        Mood and Affect: Mood normal.        Behavior: Behavior normal.        Thought Content: Thought content normal.     Comments: Well groomed, good eye contact, normal speech and thoughts    Results for orders placed or performed in visit on 04/22/22  Microscopic Examination   Urine  Result Value Ref Range   WBC, UA 0-5 0 - 5 /hpf   RBC, Urine 0-2 0 - 2 /hpf   Epithelial Cells (non renal) 0-10 0 - 10 /hpf   Bacteria, UA None seen None seen/Few  Urinalysis, Complete  Result Value Ref Range   Specific Gravity, UA 1.020 1.005 - 1.030   pH, UA 7.0 5.0 - 7.5   Color, UA Yellow Yellow   Appearance Ur Clear Clear   Leukocytes,UA Negative Negative   Protein,UA Negative Negative/Trace   Glucose, UA Negative Negative   Ketones, UA Negative Negative   RBC, UA Trace (A) Negative   Bilirubin, UA Negative Negative   Urobilinogen, Ur 0.2 0.2 - 1.0 mg/dL   Nitrite, UA Negative Negative   Microscopic Examination See below:       Assessment & Plan:   Problem List Items Addressed This Visit     Essential hypertension - Primary    Elevated BP now off  med No known complications  Off Losartan 50, Off Amlodipine 10   Plan:  Switch to other ARB Valsartan  daily Continue HCTZ   Encourage improved lifestyle - low sodium diet, regular exercise Continue monitor BP outside office, bring readings to next visit, if persistently >140/90 or new symptoms notify office sooner  Upcoming visit with Cardiology      Relevant Medications   valsartan (DIOVAN) 80 MG tablet   hydrochlorothiazide (HYDRODIURIL) 25 MG tablet   Other Relevant Orders   Ambulatory referral to Cardiology   COMPLETE METABOLIC PANEL WITH GFR   CBC with Differential/Platelet   GERD (gastroesophageal reflux disease)   History of tibial fracture   Hyperlipidemia    Controlled cholesterol on statin lifestyle Last lipid panel 08/2022 The 10-year ASCVD risk score (Arnett DK, et al., 2019) is: 7.8%  Plan: 1. Continue current meds - Rosuvastatin  2. Encourage improved lifestyle - low carb/cholesterol, reduce portion size, continue improving regular exercise Follow-up       Relevant Medications   valsartan (DIOVAN) 80 MG tablet   hydrochlorothiazide (HYDRODIURIL) 25 MG tablet   Other Relevant Orders   COMPLETE METABOLIC PANEL  WITH GFR   Lipid panel   TSH   Other Visit Diagnoses     Annual physical exam       Relevant Orders   COMPLETE METABOLIC PANEL WITH GFR   CBC with Differential/Platelet   Hemoglobin A1c   Lipid panel   TSH   Post-traumatic arthritis of left ankle       Relevant Medications   celecoxib (CELEBREX) 200 MG capsule   Atypical chest pain       Relevant Orders   Ambulatory referral to Cardiology   Dyspnea on exertion       Relevant Orders   Ambulatory referral to Cardiology   Elevated hemoglobin A1c       Relevant Orders   Hemoglobin A1c       Updated Health Maintenance information Reviewed recent lab results with patient Encouraged improvement to lifestyle with diet and exercise Goal of weight loss  Re ordered Celebrex as  needed for ankle/foot.  Atypical Chest Pain, seems to have chest tightness / dyspnea, anginal equivalents but not consistent. Given BP fluctuation and med changes She request to see Cardiology and consider stress testing   Referral sent.  [facility]) HeartCare at [facility] [address] [place], [place] [address] Main: [phone]    Meds ordered this encounter  Medications   celecoxib (CELEBREX) 200 MG capsule    Sig: Take 1 capsule (200 mg total) by mouth 2 (two) times daily as needed.    Dispense:  60 capsule    Refill:  3   valsartan (DIOVAN) 80 MG tablet    Sig: Take 1 tablet (80 mg total) by mouth daily.    Dispense:  30 tablet    Refill:  0   hydrochlorothiazide (HYDRODIURIL) 25 MG tablet    Sig: Take 1 tablet (25 mg total) by mouth daily.    Dispense:  90 tablet    Refill:  3      Follow up plan: Return in about 6 months (around 03/13/2023) for 6 month Follow-up HTN, updates.  Saralyn Pilar, DO [facility] 09/11/2022, 8:37 AM

## 2022-09-11 NOTE — Telephone Encounter (Signed)
Requested medication (s) are due for refill today:   New rx written today during OV  Requested medication (s) are on the active medication list:   Yes  Future visit scheduled:   N/A   Last ordered: today #30, 0 refills  New medication started today.   Request has come in for a 90 day supply.   Requested Prescriptions  Pending Prescriptions Disp Refills   valsartan (DIOVAN) 80 MG tablet [Pharmacy Med Name: VALSARTAN  TABLETS] 90 tablet     Sig: TAKE 1 TABLET(80 MG) BY MOUTH DAILY     Cardiovascular:  Angiotensin Receptor Blockers Passed - 09/11/2022  9:47 AM      Passed - Cr in normal range and within 180 days    Creat  Date Value Ref Range Status  03/19/2022 0.91 0.50 - 1.05 mg/dL Final         Passed - K in normal range and within 180 days    Potassium  Date Value Ref Range Status  03/19/2022 4.1 3.5 - 5.3 mmol/L Final         Passed - Patient is not pregnant      Passed - Last BP in normal range    BP Readings from Last 1 Encounters:  09/11/22 138/88         Passed - Valid encounter within last 6 months    Recent Outpatient Visits           Today Essential hypertension   [place] [facility] Smitty Cords, DO   1 week ago Essential hypertension   [place] [facility] [place], Jackelyn Poling, RPH-CPP   5 months ago Gross hematuria   [place] [facility] Smitty Cords, DO   11 months ago Acute gastritis, presence of bleeding unspecified, unspecified gastritis type   [place] [facility] Smitty Cords, DO   1 year ago Annual physical exam   [place] [facility], Netta Neat, [place]

## 2022-09-12 LAB — HEMOGLOBIN A1C
Hgb A1c MFr Bld: 5.6 % of total Hgb (ref <5.7)
Mean Plasma Glucose: 114 mg/dL
eAG (mmol/L): 6.3 mmol/L

## 2022-09-12 LAB — COMPLETE METABOLIC PANEL WITH GFR
AG Ratio: 2.1 (calc) (ref 1.0–2.5)
ALT: 20 U/L (ref 6–29)
AST: 27 U/L (ref 10–35)
Albumin: 5 g/dL (ref 3.6–5.1)
Alkaline phosphatase (APISO): 66 U/L (ref 37–153)
BUN: 15 mg/dL (ref 7–25)
CO2: 31 mmol/L (ref 20–32)
Calcium: 10.3 mg/dL (ref 8.6–10.4)
Chloride: 103 mmol/L (ref 98–110)
Creat: 0.98 mg/dL (ref 0.50–1.05)
Globulin: 2.4 g/dL (calc) (ref 1.9–3.7)
Glucose, Bld: 86 mg/dL (ref 65–99)
Potassium: 3.9 mmol/L (ref 3.5–5.3)
Sodium: 143 mmol/L (ref 135–146)
Total Bilirubin: 0.7 mg/dL (ref 0.2–1.2)
Total Protein: 7.4 g/dL (ref 6.1–8.1)
eGFR: 64 mL/min/{1.73_m2} (ref >=60)

## 2022-09-12 LAB — CBC WITH DIFFERENTIAL/PLATELET
Absolute Monocytes: 349 cells/uL (ref 200–950)
Basophils Absolute: 59 cells/uL (ref 0–200)
Basophils Relative: 1.4 %
Eosinophils Absolute: 122 cells/uL (ref 15–500)
Eosinophils Relative: 2.9 %
HCT: 40.8 % (ref 35.0–45.0)
Hemoglobin: 13.4 g/dL (ref 11.7–15.5)
Lymphs Abs: 1516 cells/uL (ref 850–3900)
MCH: 29.3 pg (ref 27.0–33.0)
MCHC: 32.8 g/dL (ref 32.0–36.0)
MCV: 89.1 fL (ref 80.0–100.0)
MPV: 9.7 fL (ref 7.5–12.5)
Monocytes Relative: 8.3 %
Neutro Abs: 2155 cells/uL (ref 1500–7800)
Neutrophils Relative %: 51.3 %
Platelets: 348 10*3/uL (ref 140–400)
RBC: 4.58 10*6/uL (ref 3.80–5.10)
RDW: 12.1 % (ref 11.0–15.0)
Total Lymphocyte: 36.1 %
WBC: 4.2 10*3/uL (ref 3.8–10.8)

## 2022-09-12 LAB — LIPID PANEL
Cholesterol: 130 mg/dL (ref <200)
HDL: 59 mg/dL (ref >=50)
LDL Cholesterol (Calc): 57 mg/dL (calc)
Non-HDL Cholesterol (Calc): 71 mg/dL (calc) (ref <130)
Total CHOL/HDL Ratio: 2.2 (calc) (ref <5.0)
Triglycerides: 64 mg/dL (ref <150)

## 2022-09-12 LAB — TSH: TSH: 2.02 mIU/L (ref 0.40–4.50)

## 2022-09-17 DIAGNOSIS — K769 Liver disease, unspecified: Secondary | ICD-10-CM | POA: Diagnosis not present

## 2022-09-21 ENCOUNTER — Ambulatory Visit: Payer: Medicare PPO | Attending: Cardiology | Admitting: Cardiology

## 2022-09-21 ENCOUNTER — Encounter: Payer: Self-pay | Admitting: Cardiology

## 2022-09-21 VITALS — BP 168/100 | HR 61 | Ht 66.0 in | Wt 173.0 lb

## 2022-09-21 DIAGNOSIS — R079 Chest pain, unspecified: Secondary | ICD-10-CM

## 2022-09-21 DIAGNOSIS — E7800 Pure hypercholesterolemia, unspecified: Secondary | ICD-10-CM | POA: Diagnosis not present

## 2022-09-21 DIAGNOSIS — I10 Essential (primary) hypertension: Secondary | ICD-10-CM

## 2022-09-21 DIAGNOSIS — R072 Precordial pain: Secondary | ICD-10-CM

## 2022-09-21 MED ORDER — METOPROLOL TARTRATE 50 MG PO TABS: 50.0000 mg | ORAL_TABLET | Freq: Once | ORAL | 0 refills | Status: DC

## 2022-09-21 MED ORDER — VALSARTAN 160 MG PO TABS: 160.0000 mg | ORAL_TABLET | Freq: Every day | ORAL | 3 refills | Status: DC

## 2022-09-21 NOTE — Patient Instructions (Signed)
Medication Instructions:   INCREASE Valsartan - Take 160mg  by mouth daily.   *If you need a refill on your cardiac medications before your next appointment, please call your pharmacy*   Lab Work:  None Ordered  If you have labs (blood work) drawn today and your tests are completely normal, you will receive your results only by: MyChart Message (if you have MyChart) OR A paper copy in the mail If you have any lab test that is abnormal or we need to change your treatment, we will call you to review the results.   Testing/Procedures:  Your physician has requested that you have an echocardiogram. Echocardiography is a painless test that uses sound waves to create images of your heart. It provides your doctor with information about the size and shape of your heart and how well your heart's chambers and valves are working. This procedure takes approximately one hour. There are no restrictions for this procedure. Please do NOT wear cologne, perfume, aftershave, or lotions (deodorant is allowed). Please arrive 15 minutes prior to your appointment time.    Your cardiac CT will be scheduled at   [facility] [address] [place], [place] [address] [phone]  If scheduled at [facility] or [facility], please arrive 15 mins early for check-in and test prep.  Please follow these instructions carefully (unless otherwise directed):  On the Night Before the Test: Be sure to Drink plenty of water. Do not consume any caffeinated/decaffeinated beverages or chocolate 12 hours prior to your test. Do not take any antihistamines 12 hours prior to your test.  On the Day of the Test: Drink plenty of water until 1 hour prior to the test. Do not eat any food 1 hour prior to test. You may take your regular medications prior to the test.  Take metoprolol (Lopressor) two hours prior to test. If you take  Hydrochlorothiazide, please HOLD on the morning of the test. FEMALES- please wear underwire-free bra if available, avoid dresses & tight clothing       After the Test: Drink plenty of water. After receiving IV contrast, you may experience a mild flushed feeling. This is normal. On occasion, you may experience a mild rash up to 24 hours after the test. This is not dangerous. If this occurs, you can take Benadryl 25 mg and increase your fluid intake. If you experience trouble breathing, this can be serious. If it is severe call 911 IMMEDIATELY. If it is mild, please call our office. If you take any of these medications: Glipizide/Metformin, Avandament, Glucavance, please do not take 48 hours after completing test unless otherwise instructed.  We will call to schedule your test 2-4 weeks out understanding that some insurance companies will need an authorization prior to the service being performed.   For non-scheduling related questions, please contact the cardiac imaging nurse navigator should you have any questions/concerns: Rockwell Alexandria, Cardiac Imaging Nurse Navigator Larey Brick, Cardiac Imaging Nurse Navigator [facility] Direct Office Dial: [phone]   For scheduling needs, including cancellations and rescheduling, please call [place], [phone].   Follow-Up: At [facility], you and your health needs are our priority.  As part of our continuing mission to provide you with exceptional heart care, we have created designated Provider Care Teams.  These Care Teams include your primary Cardiologist (physician) and Advanced Practice Providers (APPs -  Physician Assistants and Nurse Practitioners) who all work together to  provide you with the care you need, when you need it.  We recommend signing up for the patient portal called "MyChart".  Sign up information is provided on this After Visit Summary.  MyChart is used to connect with patients for  Virtual Visits (Telemedicine).  Patients are able to view lab/test results, encounter notes, upcoming appointments, etc.  Non-urgent messages can be sent to your provider as well.   To learn more about what you can do with MyChart, go to [email or web address].    Your next appointment:   2 month(s)  Provider:   You may see Debbe Odea, MD or one of the following Advanced Practice Providers on your designated Care Team:   Nicolasa Ducking, NP Eula Listen, PA-C Cadence Fransico Michael, PA-C Charlsie Quest, NP

## 2022-09-21 NOTE — Progress Notes (Signed)
Cardiology Office Note:    Date:  09/21/2022   ID:  Kendra Allison, DOB 01-Sep-1955, MRN [id number]  PCP:  Smitty Cords, DO   [facility] Providers Cardiologist:  Debbe Odea, MD     Referring MD: Saralyn Pilar *   Chief Complaint  Patient presents with   New Patient (Initial Visit)    Patient have symptoms of chest tightness, dizziness, and SOBr.  Also concerned with increased fatigue, sweating, and arm pain (no significant side).      History of Present Illness:    Kendra Allison is a 67 y.o. female with a hx of hypertension, hyperlipidemia, GERD, asthma who presents with chest pain.  Patient complains of chest pain ongoing over the past year.  Symptoms were suspicious of GERD, he underwent upper GI showing gastritis.  Was started on Pepcid.  Symptoms persist.  Occasionally has epigastric pain, left neck pain and left arm pain.  Also has nonspecific shortness of breath, takes Symbicort and albuterol for asthma, but does not get her typical asthma relief.  Her blood pressures at home are elevated ranging 150s to 180s systolic.  Currently started on valsartan for BP control.  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   Osteoporosis    Sebaceous cyst 06/13/2021    Past Surgical History:  Procedure Laterality Date   ABDOMINAL HYSTERECTOMY     BREAST BIOPSY     COLONOSCOPY WITH PROPOFOL N/A 08/10/2019   Procedure: COLONOSCOPY WITH PROPOFOL;  Surgeon: Wyline Mood, MD;  Location: [facility] ENDOSCOPY;  Service: Gastroenterology;  Laterality: N/A;   ESOPHAGOGASTRODUODENOSCOPY (EGD) WITH PROPOFOL N/A 08/10/2019   Procedure: ESOPHAGOGASTRODUODENOSCOPY (EGD) WITH PROPOFOL;  Surgeon: Wyline Mood, MD;  Location: [facility] END[facility]COPY;  Service: Gastroenterology;  Laterality: N/A;   EXTERNAL  FIXATION LEG Left 04/07/2020    EXTERNAL FIXATION LEG (Left Leg Lower)   EXTERNAL FIXATION LEG Left 04/07/2020   Procedure: EXTERNAL FIXATION LEG;  Surgeon: Roby Lofts, MD;  Location: [facility] OR;  Service: Orthopedics;  Laterality: Left;   EXTERNAL FIXATION REMOVAL Left 04/10/2020   Procedure: REMOVAL EXTERNAL FIXATION LEG;  Surgeon: Roby Lofts, MD;  Location: [facility] OR;  Service: Orthopedics;  Laterality: Left;   I & D EXTREMITY Left 04/07/2020   Procedure: IRRIGATION AND DEBRIDEMENT EXTREMITY;  Surgeon: Roby Lofts, MD;  Location: [facility] OR;  Service: Orthopedics;  Laterality: Left;   I & D EXTREMITY Left 06/12/2020   Procedure: IRRIGATION AND DEBRIDEMENT EXTREMITY;  Surgeon: Roby Lofts, MD;  Location: [facility] OR;  Service: Orthopedics;  Laterality: Left;   Nissem  2014   TIBIA IM NAIL INSERTION Left 04/10/2020   Procedure: INTRAMEDULLARY (IM) NAIL TIBIAL;  Surgeon: Roby Lofts, MD;  Location: [facility] OR;  Service: Orthopedics;  Laterality: Left;    Current Medications: Current Meds  Medication Sig   albuterol (VENTOLIN HFA) 108 (90 Base) MCG/ACT inhaler Inhale 2 puffs into the lungs every 4 (four) hours as needed for wheezing or shortness of breath (cough).   Calcium Carb-Cholecalciferol (CALCIUM + VITAMIN D3 PO) Take 1 tablet by mouth daily.   celecoxib (CELEBREX) 200 MG capsule Take 1 capsule (200 mg total) by mouth 2 (two) times daily as needed.   dicyclomine (BENTYL) 10 MG capsule Take 1 capsule (10 mg total) by mouth 3 (three) times daily before meals.  As needed for abdominal pain cramping bloating   estradiol (ESTRACE) 0.1 MG/GM vaginal cream Place 1 Applicatorful vaginally. Twice weekly   famotidine (PEPCID) 20 MG tablet Take 20 mg by mouth 2 (two) times daily.   fluticasone (FLONASE) 50 MCG/ACT nasal spray Place 2 sprays into both nostrils daily as needed for allergies.   hydrochlorothiazide (HYDRODIURIL) 25 MG tablet Take 1 tablet (25 mg total) by mouth daily.   Multiple Vitamin  (MULTIVITAMIN WITH MINERALS) TABS tablet Take 1 tablet by mouth daily. One-A-Day Multivitamin   rosuvastatin (CRESTOR) 10 MG tablet TAKE 1 TABLET AT BEDTIME   SYMBICORT 160-4.5 MCG/ACT inhaler Inhale 2 puffs into the lungs in the morning and at bedtime.   [DISCONTINUED] valsartan (DIOVAN) 80 MG tablet TAKE 1 TABLET(80 MG) BY MOUTH DAILY     Allergies:   Patient has no known allergies.   Social History   Socioeconomic History   Marital status: Single    Spouse name: Not on file   Number of children: 0   Years of education: Not on file   Highest education level: Not on file  Occupational History   Occupation: Retired  Tobacco Use   Smoking status: Never   Smokeless tobacco: Never   Tobacco comments:    Quit 40years ago.   Vaping Use   Vaping Use: Never used  Substance and Sexual Activity   Alcohol use: Yes    Comment: "rarely"    Drug use: Never   Sexual activity: Not Currently  Other Topics Concern   Not on file  Social History Narrative   ** Merged History Encounter **       Social Determinants of Health   Financial Resource Strain: Low Risk  (09/10/2022)   Overall Financial Resource Strain (CARDIA)    Difficulty of Paying Living Expenses: Not hard at all  Food Insecurity: No Food Insecurity (09/10/2022)   Hunger Vital Sign    Worried About Running Out of Food in the Last Year: Never true    Ran Out of Food in the Last Year: Never true  Transportation Needs: No Transportation Needs (09/10/2022)   PRAPARE - Administrator, Civil Service (Medical): No    Lack of Transportation (Non-Medical): No  Physical Activity: Sufficiently Active (09/10/2022)   Exercise Vital Sign    Days of Exercise per Week: 4 days    Minutes of Exercise per Session: 60 min  Stress: No Stress Concern Present (09/10/2022)   [employer] of Occupational Health - Occupational Stress Questionnaire    Feeling of Stress : Not at all  Social Connections: Moderately Integrated  (09/10/2022)   Social Connection and Isolation Panel [NHANES]    Frequency of Communication with Friends and Family: More than three times a week    Frequency of Social Gatherings with Friends and Family: Twice a week    Attends Religious Services: More than 4 times per year    Active Member of [employer] or Organizations: Yes    Attends Engineer, structural: More than 4 times per year    Marital Status: Never married     Family History: The patient's family history is not on file.  ROS:   Please see the history of present illness.     All other systems reviewed and are negative.  EKGs/Labs/Other Studies Reviewed:    The following studies were reviewed today:   EKG:  EKG is  ordered today.  The ekg ordered today demonstrates normal sinus rhythm.  Recent Labs:  09/11/2022: ALT 20; BUN 15; Creat 0.98; Hemoglobin 13.4; Platelets 348; Potassium 3.9; Sodium 143; TSH 2.02  Recent Lipid Panel    Component Value Date/Time   CHOL 130 09/11/2022 0915   TRIG 64 09/11/2022 0915   HDL 59 09/11/2022 0915   CHOLHDL 2.2 09/11/2022 0915   LDLCALC 57 09/11/2022 0915     Risk Assessment/Calculations:     HYPERTENSION CONTROL Vitals:   09/21/22 1051 09/21/22 1102  BP: (!) 160/102 (!) 168/100    The patient's blood pressure is elevated above target today.  In order to address the patient's elevated BP: A current anti-hypertensive medication was adjusted today.         Physical Exam:    VS:  BP (!) 168/100 (BP Location: Right Arm, Patient Position: Sitting, Cuff Size: Large)   Pulse 61   Ht 5\' 6"  (1.676 m)   Wt 173 lb (78.5 kg)   SpO2 98%   BMI 27.92 kg/m     Wt Readings from Last 3 Encounters:  09/21/22 173 lb (78.5 kg)  09/11/22 171 lb 3.2 oz (77.7 kg)  09/10/22 173 lb (78.5 kg)     GEN:  Well nourished, well developed in no acute distress HEENT: Normal NECK: No JVD; No carotid bruits CARDIAC: RRR, no murmurs, rubs, gallops RESPIRATORY:  Clear to auscultation  without rales, wheezing or rhonchi  ABDOMEN: Soft, non-tender, non-distended MUSCULOSKELETAL:  No edema; No deformity  SKIN: Warm and dry NEUROLOGIC:  Alert and oriented x 3 PSYCHIATRIC:  Normal affect   ASSESSMENT:    1. Precordial pain   2. Primary hypertension   3. Pure hypercholesterolemia   4. Essential hypertension   5. Chest pain, unspecified type    PLAN:    In order of problems listed above:  Chest pain, risk factors include hypertension, hyperlipidemia.  Echo, get coronary CTA.  GERD could be contributing. Hypertension, BP elevated.  Increase valsartan to 160 mg daily.  Continue HCTZ. Hyperlipidemia, cholesterol controlled.  Continue Crestor 10 mg daily.  Follow-up after cardiac testing      Medication Adjustments/Labs and Tests Ordered: Current medicines are reviewed at length with the patient today.  Concerns regarding medicines are outlined above.  Orders Placed This Encounter  Procedures   CT CORONARY MORPH W/CTA COR W/SCORE W/CA W/CM &/OR WO/CM   EKG 12-Lead   ECHOCARDIOGRAM COMPLETE   Meds ordered this encounter  Medications   valsartan (DIOVAN) 160 MG tablet    Sig: Take 1 tablet (160 mg total) by mouth daily.    Dispense:  90 tablet    Refill:  3    **Patient requests 90 days supply**    Patient Instructions  Medication Instructions:   INCREASE Valsartan - Take 160mg  by mouth daily.   *If you need a refill on your cardiac medications before your next appointment, please call your pharmacy*   Lab Work:  None Ordered  If you have labs (blood work) drawn today and your tests are completely normal, you will receive your results only by: MyChart Message (if you have MyChart) OR A paper copy in the mail If you have any lab test that is abnormal or we need to change your treatment, we will call you to review the results.   Testing/Procedures:  Your physician has requested that you have an echocardiogram. Echocardiography is a painless test  that uses sound waves to create images of your heart. It provides your doctor with information about the size and shape of your heart and  how well your heart's chambers and valves are working. This procedure takes approximately one hour. There are no restrictions for this procedure. Please do NOT wear cologne, perfume, aftershave, or lotions (deodorant is allowed). Please arrive 15 minutes prior to your appointment time.    Your cardiac CT will be scheduled at   [facility] [address] [place], [place] [address] [phone]  If scheduled at [facility] or [facility], please arrive 15 mins early for check-in and test prep.  Please follow these instructions carefully (unless otherwise directed):  On the Night Before the Test: Be sure to Drink plenty of water. Do not consume any caffeinated/decaffeinated beverages or chocolate 12 hours prior to your test. Do not take any antihistamines 12 hours prior to your test.  On the Day of the Test: Drink plenty of water until 1 hour prior to the test. Do not eat any food 1 hour prior to test. You may take your regular medications prior to the test.  Take metoprolol (Lopressor) two hours prior to test. If you take Hydrochlorothiazide, please HOLD on the morning of the test. FEMALES- please wear underwire-free bra if available, avoid dresses & tight clothing       After the Test: Drink plenty of water. After receiving IV contrast, you may experience a mild flushed feeling. This is normal. On occasion, you may experience a mild rash up to 24 hours after the test. This is not dangerous. If this occurs, you can take Benadryl 25 mg and increase your fluid intake. If you experience trouble breathing, this can be serious. If it is severe call 911 IMMEDIATELY. If it is mild, please call our office. If you take any of these medications: Glipizide/Metformin,  Avandament, Glucavance, please do not take 48 hours after completing test unless otherwise instructed.  We will call to schedule your test 2-4 weeks out understanding that some insurance companies will need an authorization prior to the service being performed.   For non-scheduling related questions, please contact the cardiac imaging nurse navigator should you have any questions/concerns: Rockwell Alexandria, Cardiac Imaging Nurse Navigator Larey Brick, Cardiac Imaging Nurse Navigator [facility] Direct Office Dial: [phone]   For scheduling needs, including cancellations and rescheduling, please call [place], [phone].   Follow-Up: At [facility], you and your health needs are our priority.  As part of our continuing mission to provide you with exceptional heart care, we have created designated Provider Care Teams.  These Care Teams include your primary Cardiologist (physician) and Advanced Practice Providers (APPs -  Physician Assistants and Nurse Practitioners) who all work together to provide you with the care you need, when you need it.  We recommend signing up for the patient portal called "MyChart".  Sign up information is provided on this After Visit Summary.  MyChart is used to connect with patients for Virtual Visits (Telemedicine).  Patients are able to view lab/test results, encounter notes, upcoming appointments, etc.  Non-urgent messages can be sent to your provider as well.   To learn more about what you can do with MyChart, go to [email or web address].    Your next appointment:   2 month(s)  Provider:   You may see Debbe Odea, MD or one of the following Advanced Practice Providers on your designated Care Team:   Nicolasa Ducking, NP Eula Listen, PA-C Cadence Fransico Michael, PA-C Charlsie Quest, NP   Signed, Debbe Odea, MD  09/21/2022 11:56  AM    [place]

## 2022-09-21 NOTE — Addendum Note (Signed)
Addended by: Margrett Rud on: 09/21/2022 12:13 PM   Modules accepted: Orders

## 2022-09-22 ENCOUNTER — Telehealth: Payer: Self-pay | Admitting: Cardiology

## 2022-09-22 DIAGNOSIS — I10 Essential (primary) hypertension: Secondary | ICD-10-CM

## 2022-09-22 MED ORDER — VALSARTAN 160 MG PO TABS: 160.0000 mg | ORAL_TABLET | Freq: Every day | ORAL | 0 refills | Status: DC

## 2022-09-22 NOTE — Telephone Encounter (Signed)
*  STAT* If patient is at the pharmacy, call can be transferred to refill team.   1. Which medications need to be refilled? (please list name of each medication and dose if known) valsartan (DIOVAN) 160 MG tablet   2. Which pharmacy/location (including street and city if local pharmacy) is medication to be sent to? [facility] - [place], [place] - [address]   3. Do they need a 30 day or 90 day supply? 90

## 2022-09-22 NOTE — Telephone Encounter (Signed)
Requested Prescriptions   Signed Prescriptions Disp Refills   valsartan (DIOVAN) 160 MG tablet 90 tablet 0    Sig: Take 1 tablet (160 mg total) by mouth daily.    Authorizing Provider: Debbe Odea    Ordering User: Thayer Headings, Brandom Kerwin L

## 2022-09-29 ENCOUNTER — Ambulatory Visit (HOSPITAL_COMMUNITY): Payer: Medicare PPO

## 2022-10-01 ENCOUNTER — Ambulatory Visit: Payer: Medicare PPO | Attending: Cardiology

## 2022-10-01 DIAGNOSIS — R072 Precordial pain: Secondary | ICD-10-CM

## 2022-10-01 DIAGNOSIS — R079 Chest pain, unspecified: Secondary | ICD-10-CM | POA: Diagnosis not present

## 2022-10-01 LAB — ECHOCARDIOGRAM COMPLETE
AR max vel: 1.46 cm2
AV Area VTI: 1.5 cm2
AV Area mean vel: 1.44 cm2
AV Mean grad: 5 mmHg
AV Peak grad: 9.2 mmHg
Ao pk vel: 1.52 m/s
Area-P 1/2: 3.32 cm2
Calc EF: 53.7 %
S' Lateral: 3.5 cm
Single Plane A2C EF: 52.2 %
Single Plane A4C EF: 55.1 %

## 2022-10-06 ENCOUNTER — Ambulatory Visit (HOSPITAL_COMMUNITY): Payer: Medicare PPO

## 2022-10-06 ENCOUNTER — Telehealth: Payer: Self-pay | Admitting: *Deleted

## 2022-10-06 ENCOUNTER — Telehealth: Payer: Self-pay | Admitting: Cardiology

## 2022-10-06 NOTE — Telephone Encounter (Signed)
Patient calling in about her echo results. Please advise

## 2022-10-06 NOTE — Telephone Encounter (Signed)
Call received with patient on the line. She reports that starting Saturday she developed headache, neck pain, jaw pain, and chest pain. She also mentioned back pain as well. She went on to say that she had previous work up and that it was all GI related with reflux and they put her on medication. She has continued to have these symptoms for over a year. She then mentioned that she didn't want to wait and do CCTA. She then stated she was going to [facility] for second opinion. Advised that she needs to proceed to local ED for further evaluation and work up. She then states she will go to that ED for her work up. She verbalized understanding of our conversation with no further questions.

## 2022-10-06 NOTE — Telephone Encounter (Signed)
Spoke with patient.  Reviewed Echo results with patient.   Patient reports that since Saturday she has been having chest pain, Arm pain, Jaw pain, and Neck pain.   Transferred call to Rinaldo Cloud, RN to triage patient.  Please see her encounter for full details.

## 2022-10-07 DIAGNOSIS — Z87891 Personal history of nicotine dependence: Secondary | ICD-10-CM | POA: Diagnosis not present

## 2022-10-07 DIAGNOSIS — R079 Chest pain, unspecified: Secondary | ICD-10-CM | POA: Diagnosis not present

## 2022-10-07 DIAGNOSIS — M546 Pain in thoracic spine: Secondary | ICD-10-CM | POA: Diagnosis not present

## 2022-10-07 DIAGNOSIS — M549 Dorsalgia, unspecified: Secondary | ICD-10-CM | POA: Diagnosis not present

## 2022-10-07 DIAGNOSIS — R42 Dizziness and giddiness: Secondary | ICD-10-CM | POA: Diagnosis not present

## 2022-10-07 DIAGNOSIS — R0789 Other chest pain: Secondary | ICD-10-CM | POA: Diagnosis not present

## 2022-10-07 DIAGNOSIS — R1013 Epigastric pain: Secondary | ICD-10-CM | POA: Diagnosis not present

## 2022-10-07 DIAGNOSIS — R350 Frequency of micturition: Secondary | ICD-10-CM | POA: Diagnosis not present

## 2022-10-07 DIAGNOSIS — R7989 Other specified abnormal findings of blood chemistry: Secondary | ICD-10-CM | POA: Diagnosis not present

## 2022-10-07 DIAGNOSIS — R000 Tachycardia, unspecified: Secondary | ICD-10-CM | POA: Diagnosis not present

## 2022-10-07 DIAGNOSIS — R519 Headache, unspecified: Secondary | ICD-10-CM | POA: Diagnosis not present

## 2022-10-07 DIAGNOSIS — I2510 Atherosclerotic heart disease of native coronary artery without angina pectoris: Secondary | ICD-10-CM | POA: Diagnosis not present

## 2022-10-07 DIAGNOSIS — R0602 Shortness of breath: Secondary | ICD-10-CM | POA: Diagnosis not present

## 2022-10-07 DIAGNOSIS — R9431 Abnormal electrocardiogram [ECG] [EKG]: Secondary | ICD-10-CM | POA: Diagnosis not present

## 2022-10-09 NOTE — Telephone Encounter (Signed)
Patient reports she went to [facility] and they did work up for her head and pain. She is currently scheduled to have her scan done in June. She would like to see if she can have it done sooner. Advised that I would check on that for her and give her a call back. She verbalized understanding.

## 2022-10-09 NOTE — Telephone Encounter (Signed)
Cardiac CTA scheduled for 06/13

## 2022-10-12 ENCOUNTER — Telehealth: Payer: Self-pay | Admitting: Cardiology

## 2022-10-12 ENCOUNTER — Other Ambulatory Visit: Payer: Self-pay

## 2022-10-12 MED ORDER — METOPROLOL TARTRATE 50 MG PO TABS: 50.0000 mg | ORAL_TABLET | Freq: Once | ORAL | 0 refills | Status: DC

## 2022-10-12 NOTE — Telephone Encounter (Signed)
Spoke with patient to advise she should keep scheduled appointment because the next available would be later next month. Confirmed date and time for that test to be done. She also inquired about the pill she was to take for her test. She called the pharmacy and they did not have it for her to pick up. Encouraged her to call them because I do see where it was sent in on 09/21/22. She verbalized understanding and will follow up with her pharmacy. She was appreciative for the call with no further questions for now.

## 2022-10-12 NOTE — Telephone Encounter (Signed)
Requested Prescriptions   Signed Prescriptions Disp Refills   metoprolol tartrate (LOPRESSOR) 50 MG tablet 1 tablet 0    Sig: Take 1 tablet (50 mg total) by mouth once for 1 dose. TWO HOURS PRIOR TO CARDIAC CTA    Authorizing Provider: Debbe Odea    Ordering User: Margrett Rud

## 2022-10-12 NOTE — Telephone Encounter (Signed)
*  STAT* If patient is at the pharmacy, call can be transferred to refill team.   1. Which medications need to be refilled? (please list name of each medication and dose if known) metoprolol tartrate (LOPRESSOR) 50 MG tablet (Expired)   2. Which pharmacy/location (including street and city if local pharmacy) is medication to be sent to? [id number] - [place], [place] - [address] AT [facility] OF [address]   3. Do they need a 30 day or 90 day supply? 1 Tablet

## 2022-10-14 ENCOUNTER — Ambulatory Visit (HOSPITAL_COMMUNITY)
Admission: RE | Admit: 2022-10-14 | Discharge: 2022-10-14 | Disposition: A | Discharge status: Home / Self Care | Payer: Medicare PPO | Source: Ambulatory Visit | Attending: Cardiology | Admitting: Cardiology

## 2022-10-14 DIAGNOSIS — R079 Chest pain, unspecified: Secondary | ICD-10-CM | POA: Insufficient documentation

## 2022-10-14 DIAGNOSIS — I2510 Atherosclerotic heart disease of native coronary artery without angina pectoris: Secondary | ICD-10-CM | POA: Diagnosis not present

## 2022-10-14 DIAGNOSIS — I700 Atherosclerosis of aorta: Secondary | ICD-10-CM | POA: Insufficient documentation

## 2022-10-14 MED ORDER — METOPROLOL TARTRATE 5 MG/5ML IV SOLN
5.0000 mg | Freq: Once | INTRAVENOUS | Status: AC
  Administered 2022-10-14: 5 mg via INTRAVENOUS

## 2022-10-14 MED ORDER — NITROGLYCERIN 0.4 MG SL SUBL
SUBLINGUAL_TABLET | SUBLINGUAL | Status: AC
  Filled 2022-10-14: qty 2

## 2022-10-14 MED ORDER — METOPROLOL TARTRATE 5 MG/5ML IV SOLN
10.0000 mg | Freq: Once | INTRAVENOUS | Status: AC
  Administered 2022-10-14: 10 mg via INTRAVENOUS

## 2022-10-14 MED ORDER — METOPROLOL TARTRATE 5 MG/5ML IV SOLN
INTRAVENOUS | Status: AC
  Filled 2022-10-14: qty 10

## 2022-10-14 MED ORDER — IOHEXOL 350 MG/ML SOLN
95.0000 mL | Freq: Once | INTRAVENOUS | Status: AC | PRN
  Administered 2022-10-14: 95 mL via INTRAVENOUS

## 2022-10-14 MED ORDER — METOPROLOL TARTRATE 5 MG/5ML IV SOLN
INTRAVENOUS | Status: AC
  Filled 2022-10-14: qty 5

## 2022-10-14 MED ORDER — NITROGLYCERIN 0.4 MG SL SUBL
0.8000 mg | SUBLINGUAL_TABLET | Freq: Once | SUBLINGUAL | Status: AC
  Administered 2022-10-14: 0.8 mg via SUBLINGUAL

## 2022-10-14 NOTE — Progress Notes (Signed)
Patient tolerated CT well.Vital signs stable encourage to drink water throughout day.Reasons explained and verbalized understanding. Ambulated steady gait.   

## 2022-10-21 ENCOUNTER — Other Ambulatory Visit: Payer: Medicare PPO

## 2022-10-21 ENCOUNTER — Other Ambulatory Visit: Payer: Self-pay | Admitting: Family Medicine

## 2022-10-21 ENCOUNTER — Ambulatory Visit (HOSPITAL_COMMUNITY): Admission: RE | Admit: 2022-10-21 | Payer: Medicare PPO | Source: Ambulatory Visit

## 2022-10-21 DIAGNOSIS — J4531 Mild persistent asthma with (acute) exacerbation: Secondary | ICD-10-CM

## 2022-10-21 NOTE — Telephone Encounter (Signed)
Requested Prescriptions  Pending Prescriptions Disp Refills   SYMBICORT 160-4.5 MCG/ACT inhaler [Pharmacy Med Name: SYMBICORT 160-4.5 MCG/ACT Aerosol] 3 each 3    Sig: INHALE 2 PUFFS INTO THE LUNGS IN THE MORNING AND AT BEDTIME.     Pulmonology:  Combination Products Passed - 10/21/2022  8:26 AM      Passed - Valid encounter within last 12 months    Recent Outpatient Visits           1 month ago Essential hypertension   [facility] [facility] Trappe, Netta Neat, DO   1 month ago Essential hypertension   [place] [facility] Delles, Jackelyn Poling, RPH-CPP   7 months ago Gross hematuria   [place] [facility] Smitty Cords, DO   1 year ago Acute gastritis, presence of bleeding unspecified, unspecified gastritis type   [place] [facility] Smitty Cords, DO   1 year ago Annual physical exam   [place] [facility] Smitty Cords, DO       Future Appointments             In 1 week Debbe Odea, MD [facility]

## 2022-10-28 ENCOUNTER — Ambulatory Visit: Payer: Medicare PPO | Attending: Cardiology | Admitting: Cardiology

## 2022-10-28 ENCOUNTER — Encounter: Payer: Self-pay | Admitting: Cardiology

## 2022-10-28 VITALS — BP 152/94 | HR 75 | Ht 65.0 in | Wt 168.4 lb

## 2022-10-28 DIAGNOSIS — E7800 Pure hypercholesterolemia, unspecified: Secondary | ICD-10-CM

## 2022-10-28 DIAGNOSIS — I10 Essential (primary) hypertension: Secondary | ICD-10-CM | POA: Diagnosis not present

## 2022-10-28 DIAGNOSIS — R072 Precordial pain: Secondary | ICD-10-CM

## 2022-10-28 MED ORDER — VALSARTAN 160 MG PO TABS: 160.0000 mg | ORAL_TABLET | Freq: Two times a day (BID) | ORAL | 3 refills | Status: DC

## 2022-10-28 NOTE — Patient Instructions (Signed)
Medication Instructions:   INCREASE Valsartan - Take one tablet ( 160mg ) by mouth twice a day.   *If you need a refill on your cardiac medications before your next appointment, please call your pharmacy*   Lab Work:  None Ordered  If you have labs (blood work) drawn today and your tests are completely normal, you will receive your results only by: MyChart Message (if you have MyChart) OR A paper copy in the mail If you have any lab test that is abnormal or we need to change your treatment, we will call you to review the results.   Testing/Procedures:  None Ordered   Follow-Up: At [facility], you and your health needs are our priority.  As part of our continuing mission to provide you with exceptional heart care, we have created designated Provider Care Teams.  These Care Teams include your primary Cardiologist (physician) and Advanced Practice Providers (APPs -  Physician Assistants and Nurse Practitioners) who all work together to provide you with the care you need, when you need it.  We recommend signing up for the patient portal called "MyChart".  Sign up information is provided on this After Visit Summary.  MyChart is used to connect with patients for Virtual Visits (Telemedicine).  Patients are able to view lab/test results, encounter notes, upcoming appointments, etc.  Non-urgent messages can be sent to your provider as well.   To learn more about what you can do with MyChart, go to [email or web address].    Your next appointment:   6 week(s)  Provider:   You may see Debbe Odea, MD ONLY

## 2022-10-28 NOTE — Progress Notes (Signed)
Cardiology Office Note:    Date:  10/28/2022   ID:  Kendra Allison, DOB [birth year], MRN [id number]  PCP:  Smitty Cords, DO   [facility] Providers Cardiologist:  Debbe Odea, MD     Referring MD: Saralyn Pilar *   Chief Complaint  Patient presents with   Follow-up    Discuss test results.  No change in current chest pain that has been happening for over a year.  Since last office visit patient was evaluated at [facility] ED for chest pains on 10/07/22.  CTA was performed and results available in Care Everywhere.  Patient still getting elevated bp readings at home.  BP in office check is 152/102 on 1st check and 152/94 on second check.    History of Present Illness:    Kendra Allison is a 67 y.o. female with a hx of hypertension, hyperlipidemia, GERD, asthma who presents for follow-up.  Last seen with symptoms of chest pain, BP was elevated.  Valsartan increased to 160 mg.  Echo and coronary CTA obtained.  Still has occasional chest pain ongoing for years, followed up at [facility] ED where coronary CTA was unrevealing.  States BP was elevated during hospital visit.  Endorses high salt diet.   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   Osteoporosis    Sebaceous cyst 06/13/2021    Past Surgical History:  Procedure Laterality Date   ABDOMINAL HYSTERECTOMY     BREAST BIOPSY     COLONOSCOPY WITH PROPOFOL N/A 08/10/2019   Procedure: COLONOSCOPY WITH PROPOFOL;  Surgeon: Wyline Mood, MD;  Location: [facility] ENDOSCOPY;  Service: Gastroenterology;  Laterality: N/A;   ESOPHAGOGASTRODUODENOSCOPY (EGD) WITH PROPOFOL N/A 08/10/2019   Procedure: ESOPHAGOGASTRODUODENOSCOPY (EGD) WITH PROPOFOL;  Surgeon: Wyline Mood, MD;  Location: [facility] ENDOSCOPY;  Service: Gastroenterology;  Laterality: N/A;   EXTERNAL  FIXATION LEG Left 04/07/2020    EXTERNAL FIXATION LEG (Left Leg Lower)   EXTERNAL FIXATION LEG Left 04/07/2020   Procedure: EXTERNAL FIXATION LEG;  Surgeon: Roby Lofts, MD;  Location: [facility] OR;  Service: Orthopedics;  Laterality: Left;   EXTERNAL FIXATION REMOVAL Left 04/10/2020   Procedure: REMOVAL EXTERNAL FIXATION LEG;  Surgeon: Roby Lofts, MD;  Location: [facility] OR;  Service: Orthopedics;  Laterality: Left;   I & D EXTREMITY Left 04/07/2020   Procedure: IRRIGATION AND DEBRIDEMENT EXTREMITY;  Surgeon: Roby Lofts, MD;  Location: [facility] OR;  Service: Orthopedics;  Laterality: Left;   I & D EXTREMITY Left 06/12/2020   Procedure: IRRIGATION AND DEBRIDEMENT EXTREMITY;  Surgeon: Roby Lofts, MD;  Location: [facility] OR;  Service: Orthopedics;  Laterality: Left;   Nissem  2014   TIBIA IM NAIL INSERTION Left 04/10/2020   Procedure: INTRAMEDULLARY (IM) NAIL TIBIAL;  Surgeon: Roby Lofts, MD;  Location: [facility] OR;  Service: Orthopedics;  Laterality: Left;    Current Medications: Current Meds  Medication Sig   albuterol (VENTOLIN HFA) 108 (90 Base) MCG/ACT inhaler Inhale 2 puffs into the lungs every 4 (four) hours as needed for wheezing or shortness of breath (cough).   Calcium Carb-Cholecalciferol (CALCIUM + VITAMIN D3 PO) Take 1 tablet by mouth daily.   celecoxib (CELEBREX) 200 MG capsule Take 1 capsule (200 mg total) by mouth 2 (two) times daily as needed.   dicyclomine (BENTYL) 10 MG capsule Take 1 capsule (  10 mg total) by mouth 3 (three) times daily before meals. As needed for abdominal pain cramping bloating   estradiol (ESTRACE) 0.1 MG/GM vaginal cream Place 1 Applicatorful vaginally. Twice weekly   famotidine (PEPCID) 20 MG tablet Take 20 mg by mouth 2 (two) times daily.   fluticasone (FLONASE) 50 MCG/ACT nasal spray Place 2 sprays into both nostrils daily as needed for allergies.   hydrochlorothiazide (HYDRODIURIL) 25 MG tablet Take 1 tablet (25 mg total) by mouth daily.   Multiple Vitamin  (MULTIVITAMIN WITH MINERALS) TABS tablet Take 1 tablet by mouth daily. One-A-Day Multivitamin   rosuvastatin (CRESTOR) 10 MG tablet TAKE 1 TABLET AT BEDTIME   SYMBICORT 160-4.5 MCG/ACT inhaler INHALE 2 PUFFS INTO THE LUNGS IN THE MORNING AND AT BEDTIME.   [DISCONTINUED] valsartan (DIOVAN) 160 MG tablet Take 1 tablet (160 mg total) by mouth daily.     Allergies:   Patient has no known allergies.   Social History   Socioeconomic History   Marital status: Single    Spouse name: Not on file   Number of children: 0   Years of education: Not on file   Highest education level: Not on file  Occupational History   Occupation: Retired  Tobacco Use   Smoking status: Never   Smokeless tobacco: Never   Tobacco comments:    Quit 40years ago.   Vaping Use   Vaping Use: Never used  Substance and Sexual Activity   Alcohol use: Yes    Comment: "rarely"    Drug use: Never   Sexual activity: Not Currently  Other Topics Concern   Not on file  Social History Narrative   ** Merged History Encounter **       Social Determinants of Health   Financial Resource Strain: Low Risk  (09/10/2022)   Overall Financial Resource Strain (CARDIA)    Difficulty of Paying Living Expenses: Not hard at all  Food Insecurity: No Food Insecurity (09/10/2022)   Hunger Vital Sign    Worried About Running Out of Food in the Last Year: Never true    Ran Out of Food in the Last Year: Never true  Transportation Needs: No Transportation Needs (09/10/2022)   PRAPARE - Administrator, Civil Service (Medical): No    Lack of Transportation (Non-Medical): No  Physical Activity: Sufficiently Active (09/10/2022)   Exercise Vital Sign    Days of Exercise per Week: 4 days    Minutes of Exercise per Session: 60 min  Stress: No Stress Concern Present (09/10/2022)   [employer] of Occupational Health - Occupational Stress Questionnaire    Feeling of Stress : Not at all  Social Connections: Moderately Integrated  (09/10/2022)   Social Connection and Isolation Panel [NHANES]    Frequency of Communication with Friends and Family: More than three times a week    Frequency of Social Gatherings with Friends and Family: Twice a week    Attends Religious Services: More than 4 times per year    Active Member of [employer] or Organizations: Yes    Attends Engineer, structural: More than 4 times per year    Marital Status: Never married     Family History: The patient's family history is not on file.  ROS:   Please see the history of present illness.     All other systems reviewed and are negative.  EKGs/Labs/Other Studies Reviewed:    The following studies were reviewed today:   EKG:  EKG not  ordered  today.    Recent Labs: 09/11/2022: ALT 20; BUN 15; Creat 0.98; Hemoglobin 13.4; Platelets 348; Potassium 3.9; Sodium 143; TSH 2.02  Recent Lipid Panel    Component Value Date/Time   CHOL 130 09/11/2022 0915   TRIG 64 09/11/2022 0915   HDL 59 09/11/2022 0915   CHOLHDL 2.2 09/11/2022 0915   LDLCALC 57 09/11/2022 0915     Risk Assessment/Calculations:     Physical Exam:    VS:  BP (!) 152/94 (BP Location: Right Arm, Patient Position: Sitting, Cuff Size: Normal)   Pulse 75   Ht 5\' 5"  (1.651 m)   Wt 168 lb 6.4 oz (76.4 kg)   SpO2 98%   BMI 28.02 kg/m     Wt Readings from Last 3 Encounters:  10/28/22 168 lb 6.4 oz (76.4 kg)  09/21/22 173 lb (78.5 kg)  09/11/22 171 lb 3.2 oz (77.7 kg)     GEN:  Well nourished, well developed in no acute distress HEENT: Normal NECK: No JVD; No carotid bruits CARDIAC: RRR, no murmurs, rubs, gallops RESPIRATORY:  Clear to auscultation without rales, wheezing or rhonchi  ABDOMEN: Soft, non-tender, non-distended MUSCULOSKELETAL:  No edema; No deformity  SKIN: Warm and dry NEUROLOGIC:  Alert and oriented x 3 PSYCHIATRIC:  Normal affect   ASSESSMENT:    1. Precordial pain   2. Primary hypertension   3. Pure hypercholesterolemia   4. Essential  hypertension    PLAN:    In order of problems listed above:  Chest pain, coronary CTA with minimal nonobstructive mid LAD disease, calcium score 39.1,.  Echocardiogram shows normal systolic function EF 60 to 65%, LAE.  LDL at goal.  Continue Crestor. Hypertension, BP elevated.  Increase valsartan to 160 mg twice daily.  Continue HCTZ 20 mg daily.  If BP stays elevated at follow-up visit, plan to add Norvasc. Hyperlipidemia, cholesterol controlled.  Continue Crestor 10 mg daily.  Follow-up in 6 weeks     Medication Adjustments/Labs and Tests Ordered: Current medicines are reviewed at length with the patient today.  Concerns regarding medicines are outlined above.  No orders of the defined types were placed in this encounter.  Meds ordered this encounter  Medications   valsartan (DIOVAN) 160 MG tablet    Sig: Take 1 tablet (160 mg total) by mouth 2 (two) times daily.    Dispense:  180 tablet    Refill:  3    Patient Instructions  Medication Instructions:   INCREASE Valsartan - Take one tablet ( 160mg ) by mouth twice a day.   *If you need a refill on your cardiac medications before your next appointment, please call your pharmacy*   Lab Work:  None Ordered  If you have labs (blood work) drawn today and your tests are completely normal, you will receive your results only by: MyChart Message (if you have MyChart) OR A paper copy in the mail If you have any lab test that is abnormal or we need to change your treatment, we will call you to review the results.   Testing/Procedures:  None Ordered   Follow-Up: At [facility], you and your health needs are our priority.  As part of our continuing mission to provide you with exceptional heart care, we have created designated Provider Care Teams.  These Care Teams include your primary Cardiologist (physician) and Advanced Practice Providers (APPs -  Physician Assistants and Nurse Practitioners) who all work together to  provide you with the care you need, when you need  it.  We recommend signing up for the patient portal called "MyChart".  Sign up information is provided on this After Visit Summary.  MyChart is used to connect with patients for Virtual Visits (Telemedicine).  Patients are able to view lab/test results, encounter notes, upcoming appointments, etc.  Non-urgent messages can be sent to your provider as well.   To learn more about what you can do with MyChart, go to [email or web address].    Your next appointment:   6 week(s)  Provider:   You may see Debbe Odea, MD ONLY     Signed, Debbe Odea, MD  10/28/2022 9:35 AM    [facility]

## 2022-11-05 ENCOUNTER — Ambulatory Visit: Payer: Medicare PPO

## 2022-11-11 ENCOUNTER — Ambulatory Visit (INDEPENDENT_AMBULATORY_CARE_PROVIDER_SITE_OTHER): Payer: Medicare PPO | Admitting: Family Medicine

## 2022-11-11 ENCOUNTER — Encounter: Payer: Self-pay | Admitting: Family Medicine

## 2022-11-11 VITALS — BP 196/105 | HR 73 | Ht 65.0 in | Wt 170.0 lb

## 2022-11-11 DIAGNOSIS — J4520 Mild intermittent asthma, uncomplicated: Secondary | ICD-10-CM | POA: Diagnosis not present

## 2022-11-11 DIAGNOSIS — N3001 Acute cystitis with hematuria: Secondary | ICD-10-CM | POA: Diagnosis not present

## 2022-11-11 DIAGNOSIS — I10 Essential (primary) hypertension: Secondary | ICD-10-CM | POA: Diagnosis not present

## 2022-11-11 DIAGNOSIS — R0609 Other forms of dyspnea: Secondary | ICD-10-CM

## 2022-11-11 MED ORDER — AMOXICILLIN-POT CLAVULANATE 875-125 MG PO TABS: 1.0000 | ORAL_TABLET | Freq: Two times a day (BID) | ORAL | 0 refills | Status: DC

## 2022-11-11 NOTE — Progress Notes (Unsigned)
Subjective:    Patient ID: Kendra Allison, female    DOB: 01-22-1956, 67 y.o.   MRN: [id number]  Kendra Allison is a 67 y.o. female presenting on 11/11/2022 for Abdominal Pain (Off and on) and Urinary Frequency (Back pain started saturday)   HPI  Urinary Frequency / Dysuria UTI Reports symptoms started low back pain and urinary frequency Abnormal foul odor to urine  Dyspnea on exertion CAD HYPERTENSION HLD   Has seen Cardiologist recently. Dx with Coronary Artery Disease On Valsartan 160mg , hydrochlorothiazide 20mg  daily with urinary urgency  Admits tightness with breathing Using Symbicort and Albuterol Interested in diagnostic w/ Pulmonology   Recent Labs    03/19/22 0857 09/11/22 0915  HGBA1C 5.7* 5.6        11/11/2022    3:39 PM 09/10/2022    2:08 PM 03/19/2022    8:19 AM  Depression screen PHQ 2/9  Decreased Interest 0 0 0  Down, Depressed, Hopeless 0 0 0  PHQ - 2 Score 0 0 0  Altered sleeping  0 2  Tired, decreased energy  0 3  Change in appetite  0 0  Feeling bad or failure about yourself   0 0  Trouble concentrating  0 0  Moving slowly or fidgety/restless  0 0  Suicidal thoughts  0 0  PHQ-9 Score  0 5  Difficult doing work/chores  Not difficult at all Not difficult at all    Social History   Tobacco Use   Smoking status: Never   Smokeless tobacco: Never   Tobacco comments:    Quit 40years ago.   Vaping Use   Vaping Use: Never used  Substance Use Topics   Alcohol use: Yes    Comment: "rarely"    Drug use: Never    Review of Systems Per HPI unless specifically indicated above     Objective:    BP (!) 196/105 (BP Location: Left Arm, Patient Position: Sitting, Cuff Size: Normal) Comment: electronic device  Pulse 73   Ht 5\' 5"  (1.651 m)   Wt 170 lb (77.1 kg)   SpO2 100%   BMI 28.29 kg/m   Wt Readings from Last 3 Encounters:  11/11/22 170 lb (77.1 kg)  10/28/22 168 lb 6.4 oz (76.4 kg)  09/21/22 173 lb (78.5 kg)    Physical  Exam Vitals and nursing note reviewed.  Constitutional:      General: She is not in acute distress.    Appearance: She is well-developed. She is not diaphoretic.     Comments: Well-appearing, comfortable, cooperative  HENT:     Head: Normocephalic and atraumatic.  Eyes:     General:        Right eye: No discharge.        Left eye: No discharge.     Conjunctiva/sclera: Conjunctivae normal.  Neck:     Thyroid: No thyromegaly.  Cardiovascular:     Rate and Rhythm: Normal rate and regular rhythm.     Heart sounds: Normal heart sounds. No murmur heard. Pulmonary:     Effort: Pulmonary effort is normal. No respiratory distress.     Breath sounds: Normal breath sounds. No wheezing or rales.  Musculoskeletal:        General: Normal range of motion.     Cervical back: Normal range of motion and neck supple.     Right lower leg: No edema.     Left lower leg: No edema.  Lymphadenopathy:     Cervical: No cervical adenopathy.  Skin:    General: Skin is warm and dry.     Findings: No erythema or rash.  Neurological:     Mental Status: She is alert and oriented to person, place, and time.  Psychiatric:        Behavior: Behavior normal.     Comments: Well groomed, good eye contact, normal speech and thoughts    Results for orders placed or performed in visit on 11/11/22  Urinalysis, Routine w reflex microscopic  Result Value Ref Range   Color, Urine YELLOW YELLOW   APPearance CLEAR CLEAR   Specific Gravity, Urine 1.006 1.001 - 1.035   pH 7.0 5.0 - 8.0   Glucose, UA NEGATIVE NEGATIVE   Bilirubin Urine NEGATIVE NEGATIVE   Ketones, ur NEGATIVE NEGATIVE   Hgb urine dipstick NEGATIVE NEGATIVE   Protein, ur NEGATIVE NEGATIVE   Nitrite NEGATIVE NEGATIVE   Leukocytes,Ua NEGATIVE NEGATIVE      Assessment & Plan:   Problem List Items Addressed This Visit     Asthma   Relevant Orders   Ambulatory referral to Pulmonology   Essential hypertension   Other Visit Diagnoses     Acute  cystitis with hematuria    -  Primary   Relevant Medications   amoxicillin-clavulanate (AUGMENTIN) 875-125 MG tablet   Other Relevant Orders   Urine Culture   Urinalysis, Routine w reflex microscopic (Completed)   Dyspnea on exertion       Relevant Orders   Ambulatory referral to Pulmonology       Referral to Lung specialist for evaluation diagnostic for the breathing / shortness of breath  [facility] [address], [address] [place], [place] [address] Phone: [phone]  Urinary tract infection today Check urinalysis and urine culture Start antibiotic Augmentin  Rosuvastatin 10mg  the dose is working well for the cholesterol numbers. But if the Cardiologist recommends a higher dose for the arteries they may discuss.  Recent Labs    03/19/22 0857 09/11/22 0915  HGBA1C 5.7* 5.6   No sign of diabetes, only remote early pre diabetes last year but it has improved.  Orders Placed This Encounter  Procedures   Urine Culture   Urinalysis, Routine w reflex microscopic   Ambulatory referral to Pulmonology    Referral Priority:   Routine    Referral Type:   Consultation    Referral Reason:   Specialty Services Required    Requested Specialty:   Pulmonary Disease    Number of Visits Requested:   1     Meds ordered this encounter  Medications   amoxicillin-clavulanate (AUGMENTIN) 875-125 MG tablet    Sig: Take 1 tablet by mouth 2 (two) times daily.    Dispense:  20 tablet    Refill:  0      Follow up plan: Return in about 3 months (around 02/11/2023), or if symptoms worsen or fail to improve, for Follow-up 3-4 months updates from Cards/Pulm.  Kendra Pilar, DO [facility] 11/11/2022, 3:52 PM

## 2022-11-11 NOTE — Patient Instructions (Addendum)
Thank you for coming to the office today.   [facility] [address], [address] [place], [place] [address] Phone: [phone]  Referral to Lung specialist for evaluation diagnostic for the breathing / shortness of breath  Stay tuned for apt and testing.  Urinary tract infection today Check urinalysis and urine culture Start antibiotic Augmentin  Rosuvastatin 10mg  the dose is working well for the cholesterol numbers. But if the Cardiologist recommends a higher dose for the arteries they may discuss.  Recent Labs    03/19/22 0857 09/11/22 0915  HGBA1C 5.7* 5.6   No sign of diabetes, only remote early pre diabetes last year but it has improved.   Please schedule a Follow-up Appointment to: Return in about 3 months (around 02/11/2023), or if symptoms worsen or fail to improve, for Follow-up 3-4 months updates from Cards/Pulm.  If you have any other questions or concerns, please feel free to call the office or send a message through MyChart. You may also schedule an earlier appointment if necessary.  Additionally, you may be receiving a survey about your experience at our office within a few days to 1 week by e-mail or mail. We value your feedback.  Saralyn Pilar, DO [facility], [place]

## 2022-11-12 LAB — URINE CULTURE
MICRO NUMBER:: 15104122
Result:: NO GROWTH
SPECIMEN QUALITY:: ADEQUATE

## 2022-11-12 LAB — URINALYSIS, ROUTINE W REFLEX MICROSCOPIC
Bilirubin Urine: NEGATIVE
Glucose, UA: NEGATIVE
Hgb urine dipstick: NEGATIVE
Ketones, ur: NEGATIVE
Leukocytes,Ua: NEGATIVE
Nitrite: NEGATIVE
Protein, ur: NEGATIVE
Specific Gravity, Urine: 1.006 (ref 1.001–1.035)
pH: 7 (ref 5.0–8.0)

## 2022-11-18 ENCOUNTER — Telehealth: Payer: Self-pay | Admitting: Cardiology

## 2022-11-18 DIAGNOSIS — I10 Essential (primary) hypertension: Secondary | ICD-10-CM

## 2022-11-18 MED ORDER — VALSARTAN 160 MG PO TABS: 160.0000 mg | ORAL_TABLET | Freq: Two times a day (BID) | ORAL | 0 refills | Status: DC

## 2022-11-18 NOTE — Telephone Encounter (Signed)
Requested Prescriptions   Signed Prescriptions Disp Refills   valsartan (DIOVAN) 160 MG tablet 180 tablet 0    Sig: Take 1 tablet (160 mg total) by mouth 2 (two) times daily.    Authorizing Provider: Debbe Odea    Ordering User: Thayer Headings, Chanette Demo L

## 2022-11-18 NOTE — Telephone Encounter (Signed)
*  STAT* If patient is at the pharmacy, call can be transferred to refill team.   1. Which medications need to be refilled? (please list name of each medication and dose if known) valsartan (DIOVAN) 160 MG tablet   2. Which pharmacy/location (including street and city if local pharmacy) is medication to be sent to? [facility] - [place], [place] - [address]   3. Do they need a 30 day or 90 day supply? 90  

## 2022-11-20 ENCOUNTER — Ambulatory Visit: Payer: Medicare PPO | Admitting: Cardiology

## 2022-12-11 ENCOUNTER — Encounter: Payer: Self-pay | Admitting: Cardiology

## 2022-12-11 ENCOUNTER — Ambulatory Visit: Payer: Medicare PPO | Attending: Cardiology | Admitting: Cardiology

## 2022-12-11 VITALS — BP 164/100 | HR 70 | Ht 66.0 in | Wt 166.8 lb

## 2022-12-11 DIAGNOSIS — I2584 Coronary atherosclerosis due to calcified coronary lesion: Secondary | ICD-10-CM | POA: Diagnosis not present

## 2022-12-11 DIAGNOSIS — I10 Essential (primary) hypertension: Secondary | ICD-10-CM

## 2022-12-11 DIAGNOSIS — E7800 Pure hypercholesterolemia, unspecified: Secondary | ICD-10-CM | POA: Diagnosis not present

## 2022-12-11 DIAGNOSIS — I2510 Atherosclerotic heart disease of native coronary artery without angina pectoris: Secondary | ICD-10-CM | POA: Diagnosis not present

## 2022-12-11 MED ORDER — AMLODIPINE BESYLATE 5 MG PO TABS: 5.0000 mg | ORAL_TABLET | Freq: Every day | ORAL | 3 refills | Status: DC

## 2022-12-11 NOTE — Progress Notes (Signed)
Cardiology Office Note:    Date:  12/11/2022   ID:  Kendra Allison, DOB 1955-11-08, MRN [id number]  PCP:  Smitty Cords, DO   [facility] Providers Cardiologist:  Debbe Odea, MD     Referring MD: Saralyn Pilar *   Chief Complaint  Patient presents with   Follow-up    Patient still concerned with fluctuating blood pressure.    History of Present Illness:    Kendra Allison is a 67 y.o. female with a hx of hypertension, hyperlipidemia, GERD, asthma who presents for follow-up.  Being seen due to elevated bp. Compliant with meds as prescribed. Previously increased diovan with improvement in BP although still elevated. Systolic bp at home in the 130-150s range. Had a mold infestation at home, plans to follow up with pulmonary medicine.  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   Osteoporosis    Sebaceous cyst 06/13/2021    Past Surgical History:  Procedure Laterality Date   ABDOMINAL HYSTERECTOMY     BREAST BIOPSY     COLONOSCOPY WITH PROPOFOL N/A 08/10/2019   Procedure: COLONOSCOPY WITH PROPOFOL;  Surgeon: Wyline Mood, MD;  Location: [facility] ENDOSCOPY;  Service: Gastroenterology;  Laterality: N/A;   ESOPHAGOGASTRODUODENOSCOPY (EGD) WITH PROPOFOL N/A 08/10/2019   Procedure: ESOPHAGOGASTRODUODENOSCOPY (EGD) WITH PROPOFOL;  Surgeon: Wyline Mood, MD;  Location: [facility] ENDOSCOPY;  Service: Gastroenterology;  Laterality: N/A;   EXTERNAL FIXATION LEG Left 04/07/2020    EXTERNAL FIXATION LEG (Left Leg Lower)   EXTERNAL FIXATION LEG Left 04/07/2020   Procedure: EXTERNAL FIXATION LEG;  Surgeon: Roby Lofts, MD;  Location: [facility] OR;  Service: Orthopedics;  Laterality: Left;   EXTERNAL FIXATION REMOVAL Left 04/10/2020    Procedure: REMOVAL EXTERNAL FIXATION LEG;  Surgeon: Roby Lofts, MD;  Location: [facility] OR;  Service: Orthopedics;  Laterality: Left;   I & D EXTREMITY Left 04/07/2020   Procedure: IRRIGATION AND DEBRIDEMENT EXTREMITY;  Surgeon: Roby Lofts, MD;  Location: [facility] OR;  Service: Orthopedics;  Laterality: Left;   I & D EXTREMITY Left 06/12/2020   Procedure: IRRIGATION AND DEBRIDEMENT EXTREMITY;  Surgeon: Roby Lofts, MD;  Location: [facility] OR;  Service: Orthopedics;  Laterality: Left;   Nissem  2014   TIBIA IM NAIL INSERTION Left 04/10/2020   Procedure: INTRAMEDULLARY (IM) NAIL TIBIAL;  Surgeon: Roby Lofts, MD;  Location: [facility] OR;  Service: Orthopedics;  Laterality: Left;    Current Medications: Current Meds  Medication Sig   albuterol (VENTOLIN HFA) 108 (90 Base) MCG/ACT inhaler Inhale 2 puffs into the lungs every 4 (four) hours as needed for wheezing or shortness of breath (cough).   amLODipine (NORVASC) 5 MG tablet Take 1 tablet (5 mg total) by mouth daily.   Calcium Carb-Cholecalciferol (CALCIUM + VITAMIN D3 PO) Take 1 tablet by mouth daily.   celecoxib (CELEBREX) 200 MG capsule Take 1 capsule (200 mg total) by mouth 2 (two) times daily as needed.   dicyclomine (BENTYL) 10 MG capsule Take 1 capsule (10 mg total) by mouth 3 (three) times daily before meals. As needed for abdominal pain cramping bloating   estradiol (ESTRACE) 0.1 MG/GM vaginal cream Place 1 Applicatorful vaginally. Twice weekly   famotidine (PEPCID) 20 MG tablet Take 20 mg by mouth 2 (two) times daily.  fluticasone (FLONASE) 50 MCG/ACT nasal spray Place 2 sprays into both nostrils daily as needed for allergies.   hydrochlorothiazide (HYDRODIURIL) 25 MG tablet Take 1 tablet (25 mg total) by mouth daily.   Multiple Vitamin (MULTIVITAMIN WITH MINERALS) TABS tablet Take 1 tablet by mouth daily. One-A-Day Multivitamin   rosuvastatin (CRESTOR) 10 MG tablet TAKE 1 TABLET AT BEDTIME   SYMBICORT 160-4.5 MCG/ACT inhaler INHALE 2  PUFFS INTO THE LUNGS IN THE MORNING AND AT BEDTIME.   valsartan (DIOVAN) 160 MG tablet Take 1 tablet (160 mg total) by mouth 2 (two) times daily.     Allergies:   Patient has no known allergies.   Social History   Socioeconomic History   Marital status: Single    Spouse name: Not on file   Number of children: 0   Years of education: Not on file   Highest education level: Not on file  Occupational History   Occupation: Retired  Tobacco Use   Smoking status: Never   Smokeless tobacco: Never   Tobacco comments:    Quit 40years ago.   Vaping Use   Vaping status: Never Used  Substance and Sexual Activity   Alcohol use: Yes    Comment: "rarely"    Drug use: Never   Sexual activity: Not Currently  Other Topics Concern   Not on file  Social History Narrative   ** Merged History Encounter **       Social Determinants of Health   Financial Resource Strain: Low Risk  (09/10/2022)   Overall Financial Resource Strain (CARDIA)    Difficulty of Paying Living Expenses: Not hard at all  Food Insecurity: No Food Insecurity (09/10/2022)   Hunger Vital Sign    Worried About Running Out of Food in the Last Year: Never true    Ran Out of Food in the Last Year: Never true  Transportation Needs: No Transportation Needs (09/10/2022)   PRAPARE - Administrator, Civil Service (Medical): No    Lack of Transportation (Non-Medical): No  Physical Activity: Sufficiently Active (09/10/2022)   Exercise Vital Sign    Days of Exercise per Week: 4 days    Minutes of Exercise per Session: 60 min  Stress: No Stress Concern Present (09/10/2022)   [employer] of Occupational Health - Occupational Stress Questionnaire    Feeling of Stress : Not at all  Social Connections: Moderately Integrated (09/10/2022)   Social Connection and Isolation Panel [NHANES]    Frequency of Communication with Friends and Family: More than three times a week    Frequency of Social Gatherings with Friends and  Family: Twice a week    Attends Religious Services: More than 4 times per year    Active Member of [employer] or Organizations: Yes    Attends Engineer, structural: More than 4 times per year    Marital Status: Never married     Family History: The patient's family history is not on file.  ROS:   Please see the history of present illness.     All other systems reviewed and are negative.  EKGs/Labs/Other Studies Reviewed:    The following studies were reviewed today:   EKG:  EKG not  ordered today.    Recent Labs: 09/11/2022: ALT 20; BUN 15; Creat 0.98; Hemoglobin 13.4; Platelets 348; Potassium 3.9; Sodium 143; TSH 2.02  Recent Lipid Panel    Component Value Date/Time   CHOL 130 09/11/2022 0915   TRIG 64 09/11/2022 0915   HDL  59 09/11/2022 0915   CHOLHDL 2.2 09/11/2022 0915   LDLCALC 57 09/11/2022 0915     Risk Assessment/Calculations:     Physical Exam:    VS:  BP (!) 164/100 (BP Location: Left Arm, Patient Position: Sitting, Cuff Size: Large)   Pulse 70   Ht 5\' 6"  (1.676 m)   Wt 166 lb 12.8 oz (75.7 kg)   SpO2 98%   BMI 26.92 kg/m     Wt Readings from Last 3 Encounters:  12/11/22 166 lb 12.8 oz (75.7 kg)  11/11/22 170 lb (77.1 kg)  10/28/22 168 lb 6.4 oz (76.4 kg)     GEN:  Well nourished, well developed in no acute distress HEENT: Normal NECK: No JVD; No carotid bruits CARDIAC: RRR, no murmurs, rubs, gallops RESPIRATORY:  Clear to auscultation without rales, wheezing or rhonchi  ABDOMEN: Soft, non-tender, non-distended MUSCULOSKELETAL:  No edema; No deformity  SKIN: Warm and dry NEUROLOGIC:  Alert and oriented x 3 PSYCHIATRIC:  Normal affect   ASSESSMENT:    1. Coronary artery calcification   2. Primary hypertension   3. Pure hypercholesterolemia    PLAN:    In order of problems listed above:  minimal nonobstructive mid LAD disease, calcium score 39.1,.  Echocardiogram shows normal systolic function EF 60 to 65%, LAE.  LDL at goal.   Continue Crestor. Hypertension, BP improved but still elevated.  Start norvasc 5mg  daily. cont valsartan 160 mg twice daily.  Continue HCTZ 25 mg daily.  If BP stays elevated at follow-up visit, plan to titrate Norvasc. Hyperlipidemia, cholesterol controlled.  Continue Crestor 10 mg daily.  Follow-up in 2-3 months     Medication Adjustments/Labs and Tests Ordered: Current medicines are reviewed at length with the patient today.  Concerns regarding medicines are outlined above.  No orders of the defined types were placed in this encounter.  Meds ordered this encounter  Medications   amLODipine (NORVASC) 5 MG tablet    Sig: Take 1 tablet (5 mg total) by mouth daily.    Dispense:  180 tablet    Refill:  3    Patient Instructions  Medication Instructions:   START Amlodipine - Take one tablet ( 5mg ) by mouth daily.   *If you need a refill on your cardiac medications before your next appointment, please call your pharmacy*   Lab Work:  None Ordered  If you have labs (blood work) drawn today and your tests are completely normal, you will receive your results only by: MyChart Message (if you have MyChart) OR A paper copy in the mail If you have any lab test that is abnormal or we need to change your treatment, we will call you to review the results.   Testing/Procedures:  None Ordered   Follow-Up: At [facility], you and your health needs are our priority.  As part of our continuing mission to provide you with exceptional heart care, we have created designated Provider Care Teams.  These Care Teams include your primary Cardiologist (physician) and Advanced Practice Providers (APPs -  Physician Assistants and Nurse Practitioners) who all work together to provide you with the care you need, when you need it.  We recommend signing up for the patient portal called "MyChart".  Sign up information is provided on this After Visit Summary.  MyChart is used to connect with  patients for Virtual Visits (Telemedicine).  Patients are able to view lab/test results, encounter notes, upcoming appointments, etc.  Non-urgent messages can be sent to your  provider as well.   To learn more about what you can do with MyChart, go to [email or web address].    Your next appointment:   3 month(s)  Provider:   You may see Debbe Odea, MD or one of the following Advanced Practice Providers on your designated Care Team:   Nicolasa Ducking, NP Eula Listen, PA-C Cadence Fransico Michael, PA-C Charlsie Quest, NP   Signed, Debbe Odea, MD  12/11/2022 9:55 AM    [facility]

## 2022-12-11 NOTE — Patient Instructions (Signed)
Medication Instructions:   START Amlodipine - Take one tablet ( 5mg ) by mouth daily.   *If you need a refill on your cardiac medications before your next appointment, please call your pharmacy*   Lab Work:  None Ordered  If you have labs (blood work) drawn today and your tests are completely normal, you will receive your results only by: MyChart Message (if you have MyChart) OR A paper copy in the mail If you have any lab test that is abnormal or we need to change your treatment, we will call you to review the results.   Testing/Procedures:  None Ordered   Follow-Up: At [facility], you and your health needs are our priority.  As part of our continuing mission to provide you with exceptional heart care, we have created designated Provider Care Teams.  These Care Teams include your primary Cardiologist (physician) and Advanced Practice Providers (APPs -  Physician Assistants and Nurse Practitioners) who all work together to provide you with the care you need, when you need it.  We recommend signing up for the patient portal called "MyChart".  Sign up information is provided on this After Visit Summary.  MyChart is used to connect with patients for Virtual Visits (Telemedicine).  Patients are able to view lab/test results, encounter notes, upcoming appointments, etc.  Non-urgent messages can be sent to your provider as well.   To learn more about what you can do with MyChart, go to [email or web address].    Your next appointment:   3 month(s)  Provider:   You may see Debbe Odea, MD or one of the following Advanced Practice Providers on your designated Care Team:   Nicolasa Ducking, NP Eula Listen, PA-C Cadence Fransico Michael, PA-C Charlsie Quest, NP

## 2022-12-14 ENCOUNTER — Encounter: Payer: Self-pay | Admitting: Student in an Organized Health Care Education/Training Program

## 2022-12-14 ENCOUNTER — Ambulatory Visit: Payer: Medicare PPO | Admitting: Student in an Organized Health Care Education/Training Program

## 2022-12-14 VITALS — BP 140/80 | HR 92 | Temp 98.0°F | Ht 66.0 in | Wt 166.4 lb

## 2022-12-14 DIAGNOSIS — R0602 Shortness of breath: Secondary | ICD-10-CM | POA: Diagnosis not present

## 2022-12-14 NOTE — Progress Notes (Signed)
Synopsis: Referred in for shortness of breath by Saralyn Pilar *  Assessment & Plan:   #Shortness of breath #Mild intermittent asthma  Presenting for the evaluation of shortness of breath and dry cough, with symptoms worsening when laying flat and with exertion. Exam today is unremarkable, and read from chest imaging at [facility] reports normal lung parenchyma. Patient's symptoms could be explained by obstructive lung disease (such as asthma) as well as by possible hiatal hernia (reported on imaging and EGD). EGD from 01/2022 showed grade A reflux esophagitis and a medium sized hital hernia. This could have worsened and the hernia would explain her symptoms especially the worsening shortness of breath when laying flat.  I will proceed with obtaining a pulmonary function test to assess for any obstructive lung disease as well as evaluation of lung volumes, including ERV. I will also obtain a double contrast esophagogram to assess the hernia itself. I have discussed with the patient that mold remediation includes evaluation for any water leak and damage, and the finding of mold generally entails a deeper problem and have recommended professional remediation. That said, her chest CT did not show any parenchymal disease and I don't suspect mold to be contributing to symptoms at all. Finally, would continue Symbicort for the time being until after PFT's.  - Pulmonary Function Test [facility] Only; Future - DG ESOPHAGUS W DOUBLE CM (HD); Future   Return in about 3 months (around 03/16/2023).  I spent 60 minutes caring for this patient today, including preparing to see the patient, obtaining a medical history , reviewing a separately obtained history, performing a medically appropriate examination and/or evaluation, counseling and educating the patient/family/caregiver, ordering medications, tests, or procedures, documenting clinical information in the electronic health record, and independently  interpreting results (not separately reported/billed) and communicating results to the patient/family/caregiver  Raechel Chute, MD [facility] 12/14/2022 11:36 AM    End of visit medications:  No orders of the defined types were placed in this encounter.    Current Outpatient Medications:    albuterol (VENTOLIN HFA) 108 (90 Base) MCG/ACT inhaler, Inhale 2 puffs into the lungs every 4 (four) hours as needed for wheezing or shortness of breath (cough)., Disp: 1 each, Rfl: 3   amLODipine (NORVASC) 5 MG tablet, Take 1 tablet (5 mg total) by mouth daily., Disp: 180 tablet, Rfl: 3   Calcium Carb-Cholecalciferol (CALCIUM + VITAMIN D3 PO), Take 1 tablet by mouth daily., Disp: , Rfl:    celecoxib (CELEBREX) 200 MG capsule, Take 1 capsule (200 mg total) by mouth 2 (two) times daily as needed., Disp: 60 capsule, Rfl: 3   dicyclomine (BENTYL) 10 MG capsule, Take 1 capsule (10 mg total) by mouth 3 (three) times daily before meals. As needed for abdominal pain cramping bloating, Disp: 30 capsule, Rfl: 2   estradiol (ESTRACE) 0.1 MG/GM vaginal cream, Place 1 Applicatorful vaginally. Twice weekly, Disp: , Rfl:    famotidine (PEPCID) 20 MG tablet, Take 20 mg by mouth 2 (two) times daily., Disp: , Rfl:    fluticasone (FLONASE) 50 MCG/ACT nasal spray, Place 2 sprays into both nostrils daily as needed for allergies., Disp: 16 g, Rfl: 5   hydrochlorothiazide (HYDRODIURIL) 25 MG tablet, Take 1 tablet (25 mg total) by mouth daily., Disp: 90 tablet, Rfl: 3   Multiple Vitamin (MULTIVITAMIN WITH MINERALS) TABS tablet, Take 1 tablet by mouth daily. One-A-Day Multivitamin, Disp: , Rfl:    rosuvastatin (CRESTOR) 10 MG tablet, TAKE 1 TABLET AT BEDTIME, Disp:  90 tablet, Rfl: 2   SYMBICORT 160-4.5 MCG/ACT inhaler, INHALE 2 PUFFS INTO THE LUNGS IN THE MORNING AND AT BEDTIME., Disp: 3 each, Rfl: 3   valsartan (DIOVAN) 160 MG tablet, Take 1 tablet (160 mg total) by mouth 2 (two) times daily., Disp: 180  tablet, Rfl: 0   Subjective:   PATIENT ID: [name] GENDER: female DOB: Nov 17, 1955, MRN: [id number]  Chief Complaint  Patient presents with   Consult    SOB with exertion occ, chest tightness, dry cough and wheezing.    HPI  Patient is a pleasant 67 year old female presenting to clinic for the evaluation of shortness of breath.  Patient is reporting multiple chief complaints mostly driven by exertional dyspnea.  This shortness of breath is not present at rest though she does notice it when she lays flat.  She is also experiencing epigastric discomfort/fullness, some chest tightness, as well as cough and wheezing.  The cough is dry and nonproductive, and the patient denies any hemoptysis, fevers, chills, or night sweats.  The wheezing is occasional and is not present at this moment.  She was previously seen by cardiology for the evaluation of chest tightness and a coronary CT was performed showing minimal nonobstructive mid LAD disease with a normal echocardiogram for which medical management and optimization of risk factors was recommended.  Patient also reports being at [facility] where she was evaluated for headache as well as chest pressure.  Chest/abdomen imaging was overall unremarkable aside from the mild coronary artery calcifications that were further investigated on follow-up with cardiology.  CT head/neck angio was also within normal.  Patient reports that she has had a history of asthma diagnosed while living in [place] for which she was prescribed LABA/ICS and on which she was maintained for many years previously she reports symptoms being well-controlled up until her moved to [place] at which point she was switched to Symbicort.  Patient currently lives at home where she noted some mold on one of her doors that she cleaned with bleach.  She did not have her house professionally inspected or remediated for mold but does report but the gutters were packed  up.  Patient denies any history of smoking or other occupational exposures.  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  Ancillary information including prior medications, full medical/surgical/family/social histories, and PFTs (when available) are listed below and have been reviewed.   Review of Systems  Constitutional:  Negative for chills, fever, malaise/fatigue and weight loss.  Respiratory:  Positive for cough and shortness of breath. Negative for hemoptysis and sputum production.   Cardiovascular:  Negative for chest pain.  Gastrointestinal:  Positive for abdominal pain.  Skin:  Negative for rash.     Objective:   Vitals:   12/14/22 1059  BP: (!) 140/80  Pulse: 92  Temp: 98 F (36.7 C)  TempSrc: Temporal  SpO2: 97%  Weight: 166 lb 6.4 oz (75.5 kg)  Height: 5\' 6"  (1.676 m)   97% on RA BMI Readings from Last 3 Encounters:  12/14/22 26.86 kg/m  12/11/22 26.92 kg/m  11/11/22 28.29 kg/m   Wt Readings from Last 3 Encounters:  12/14/22 166 lb 6.4 oz (75.5 kg)  12/11/22 166 lb 12.8 oz (75.7 kg)  11/11/22 170 lb (77.1 kg)    Physical Exam Constitutional:      Appearance: Normal appearance. She is obese. She is not ill-appearing.  Cardiovascular:  Rate and Rhythm: Normal rate and regular rhythm.  Pulmonary:     Effort: Pulmonary effort is normal.     Breath sounds: Normal breath sounds. No wheezing, rhonchi or rales.  Neurological:     General: No focal deficit present.     Mental Status: She is alert and oriented to person, place, and time. Mental status is at baseline.       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    Past Surgical History:  Procedure Laterality Date   ABDOMINAL HYSTERECTOMY     BREAST BIOPSY     COLONOSCOPY WITH PROPOFOL N/A 08/10/2019   Procedure: COLONOSCOPY WITH PROPOFOL;  Surgeon: Wyline Mood, MD;  Location: [facility] ENDOSCOPY;  Service: Gastroenterology;  Laterality: N/A;   ESOPHAGOGASTRODUODENOSCOPY (EGD) WITH PROPOFOL N/A 08/10/2019   Procedure: ESOPHAGOGASTRODUODENOSCOPY (EGD) WITH PROPOFOL;  Surgeon: Wyline Mood, MD;  Location: [facility] ENDOSCOPY;  Service: Gastroenterology;  Laterality: N/A;   EXTERNAL FIXATION LEG Left 04/07/2020    EXTERNAL FIXATION LEG (Left Leg Lower)   EXTERNAL FIXATION LEG Left 04/07/2020   Procedure: EXTERNAL FIXATION LEG;  Surgeon: Roby Lofts, MD;  Location: [facility] OR;  Service: Orthopedics;  Laterality: Left;   EXTERNAL FIXATION REMOVAL Left 04/10/2020   Procedure: REMOVAL EXTERNAL FIXATION LEG;  Surgeon: Roby Lofts, MD;  Location: [facility] OR;  Service: Orthopedics;  Laterality: Left;   I & D EXTREMITY Left 04/07/2020   Procedure: IRRIGATION AND DEBRIDEMENT EXTREMITY;  Surgeon: Roby Lofts, MD;  Location: [facility] OR;  Service: Orthopedics;  Laterality: Left;   I & D EXTREMITY Left 06/12/2020   Procedure: IRRIGATION AND DEBRIDEMENT EXTREMITY;  Surgeon: Roby Lofts, MD;  Location: [facility] OR;  Service: Orthopedics;  Laterality: Left;   Nissem  2014   TIBIA IM NAIL INSERTION Left 04/10/2020   Procedure: INTRAMEDULLARY (IM) NAIL TIBIAL;  Surgeon: Roby Lofts, MD;  Location: [facility] OR;  Service: Orthopedics;  Laterality: Left;    Social History   Socioeconomic History   Marital status: Single    Spouse name: Not on file   Number of children: 0   Years of education: Not on file   Highest education level: Not on file  Occupational History   Occupation: Retired  Tobacco Use   Smoking status: Never   Smokeless tobacco: Never   Tobacco comments:    Quit 40years ago.   Vaping Use   Vaping status: Never Used  Substance and Sexual Activity   Alcohol use:  Yes    Comment: "rarely"    Drug use: Never   Sexual activity: Not Currently  Other Topics Concern   Not on file  Social History Narrative   ** Merged History Encounter **       Social Determinants of Health   Financial Resource Strain: Low Risk  (09/10/2022)   Overall Financial Resource Strain (CARDIA)    Difficulty of Paying Living Expenses: Not hard at all  Food Insecurity: No Food Insecurity (09/10/2022)   Hunger Vital Sign    Worried About Running Out of Food in the Last Year: Never true    Ran Out of Food in the Last Year: Never true  Transportation Needs: No Transportation  Needs (09/10/2022)   PRAPARE - Administrator, Civil Service (Medical): No    Lack of Transportation (Non-Medical): No  Physical Activity: Sufficiently Active (09/10/2022)   Exercise Vital Sign    Days of Exercise per Week: 4 days    Minutes of Exercise per Session: 60 min  Stress: No Stress Concern Present (09/10/2022)   [employer] of Occupational Health - Occupational Stress Questionnaire    Feeling of Stress : Not at all  Social Connections: Moderately Integrated (09/10/2022)   Social Connection and Isolation Panel [NHANES]    Frequency of Communication with Friends and Family: More than three times a week    Frequency of Social Gatherings with Friends and Family: Twice a week    Attends Religious Services: More than 4 times per year    Active Member of Clubs or Organizations: Yes    Attends Banker Meetings: More than 4 times per year    Marital Status: Never married  Intimate Partner Violence: Not At Risk (09/10/2022)   Humiliation, Afraid, Rape, and Kick questionnaire    Fear of Current or Ex-Partner: No    Emotionally Abused: No    Physically Abused: No    Sexually Abused: No     No Known Allergies   CBC    Component Value Date/Time   WBC 4.2 09/11/2022 0915   RBC 4.58 09/11/2022 0915   HGB 13.4 09/11/2022 0915   HCT 40.8 09/11/2022 0915   PLT 348  09/11/2022 0915   MCV 89.1 09/11/2022 0915   MCH 29.3 09/11/2022 0915   MCHC 32.8 09/11/2022 0915   RDW 12.1 09/11/2022 0915   LYMPHSABS 1,516 09/11/2022 0915   MONOABS 0.4 06/11/2020 1534   EOSABS 122 09/11/2022 0915   BASOSABS 59 09/11/2022 0915    Pulmonary Functions Testing Results:     No data to display          Outpatient Medications Prior to Visit  Medication Sig Dispense Refill   albuterol (VENTOLIN HFA) 108 (90 Base) MCG/ACT inhaler Inhale 2 puffs into the lungs every 4 (four) hours as needed for wheezing or shortness of breath (cough). 1 each 3   amLODipine (NORVASC) 5 MG tablet Take 1 tablet (5 mg total) by mouth daily. 180 tablet 3   Calcium Carb-Cholecalciferol (CALCIUM + VITAMIN D3 PO) Take 1 tablet by mouth daily.     celecoxib (CELEBREX) 200 MG capsule Take 1 capsule (200 mg total) by mouth 2 (two) times daily as needed. 60 capsule 3   dicyclomine (BENTYL) 10 MG capsule Take 1 capsule (10 mg total) by mouth 3 (three) times daily before meals. As needed for abdominal pain cramping bloating 30 capsule 2   estradiol (ESTRACE) 0.1 MG/GM vaginal cream Place 1 Applicatorful vaginally. Twice weekly     famotidine (PEPCID) 20 MG tablet Take 20 mg by mouth 2 (two) times daily.     fluticasone (FLONASE) 50 MCG/ACT nasal spray Place 2 sprays into both nostrils daily as needed for allergies. 16 g 5   hydrochlorothiazide (HYDRODIURIL) 25 MG tablet Take 1 tablet (25 mg total) by mouth daily. 90 tablet 3   Multiple Vitamin (MULTIVITAMIN WITH MINERALS) TABS tablet Take 1 tablet by mouth daily. One-A-Day Multivitamin     rosuvastatin (CRESTOR) 10 MG tablet TAKE 1 TABLET AT BEDTIME 90 tablet 2   SYMBICORT 160-4.5 MCG/ACT inhaler INHALE 2 PUFFS INTO THE LUNGS IN THE MORNING AND AT BEDTIME. 3 each 3   valsartan (DIOVAN) 160 MG  tablet Take 1 tablet (160 mg total) by mouth 2 (two) times daily. 180 tablet 0   No facility-administered medications prior to visit.

## 2022-12-15 ENCOUNTER — Telehealth: Payer: Self-pay

## 2022-12-15 ENCOUNTER — Ambulatory Visit
Admission: RE | Admit: 2022-12-15 | Discharge: 2022-12-15 | Disposition: A | Discharge status: Home / Self Care | Payer: Self-pay | Source: Ambulatory Visit | Attending: Student in an Organized Health Care Education/Training Program | Admitting: Student in an Organized Health Care Education/Training Program

## 2022-12-15 ENCOUNTER — Ambulatory Visit: Payer: Medicare PPO | Attending: Student in an Organized Health Care Education/Training Program

## 2022-12-15 DIAGNOSIS — R0602 Shortness of breath: Secondary | ICD-10-CM

## 2022-12-15 DIAGNOSIS — R059 Cough, unspecified: Secondary | ICD-10-CM | POA: Diagnosis not present

## 2022-12-15 DIAGNOSIS — R0609 Other forms of dyspnea: Secondary | ICD-10-CM | POA: Insufficient documentation

## 2022-12-15 MED ORDER — ALBUTEROL SULFATE (2.5 MG/3ML) 0.083% IN NEBU
2.5000 mg | INHALATION_SOLUTION | Freq: Once | RESPIRATORY_TRACT | Status: AC
  Administered 2022-12-15: 2.5 mg via RESPIRATORY_TRACT
  Filled 2022-12-15: qty 3

## 2022-12-15 NOTE — Telephone Encounter (Signed)
CTA requested from [facility] via powershare.

## 2022-12-16 NOTE — Telephone Encounter (Signed)
Spoke to [place] with [employer]. He stated that [facility] has not loaded images into power share yet.  Will continue to hold to ensure follow up.

## 2022-12-18 ENCOUNTER — Telehealth: Payer: Self-pay | Admitting: Student in an Organized Health Care Education/Training Program

## 2022-12-18 NOTE — Telephone Encounter (Addendum)
PFT 12/15/2022.

## 2022-12-18 NOTE — Telephone Encounter (Signed)
Patient called to get results of PFT- patient states she was unable to get any medication until this test. Please call back to advise.

## 2022-12-21 DIAGNOSIS — R262 Difficulty in walking, not elsewhere classified: Secondary | ICD-10-CM | POA: Diagnosis not present

## 2022-12-21 DIAGNOSIS — L603 Nail dystrophy: Secondary | ICD-10-CM | POA: Diagnosis not present

## 2022-12-21 DIAGNOSIS — M76822 Posterior tibial tendinitis, left leg: Secondary | ICD-10-CM | POA: Diagnosis not present

## 2022-12-21 DIAGNOSIS — B351 Tinea unguium: Secondary | ICD-10-CM | POA: Diagnosis not present

## 2022-12-21 DIAGNOSIS — M25572 Pain in left ankle and joints of left foot: Secondary | ICD-10-CM | POA: Diagnosis not present

## 2022-12-21 NOTE — Telephone Encounter (Signed)
Spoke to patient.  She is requesting PFT results.    Dr. Aundria Rud, please advise. Thanks

## 2022-12-21 NOTE — Telephone Encounter (Addendum)
Spoke to [place] with [employer]. He stated that [facility] has not loaded images into power share yet. I have requested images again.

## 2022-12-22 ENCOUNTER — Other Ambulatory Visit: Payer: Medicare PPO

## 2022-12-22 NOTE — Telephone Encounter (Signed)
Imaging has been uploaded.  Routing to Dr. Genia Harold to make aware.

## 2022-12-22 NOTE — Telephone Encounter (Addendum)
Will close encounter

## 2022-12-22 NOTE — Telephone Encounter (Signed)
Noted  

## 2022-12-22 NOTE — Telephone Encounter (Signed)
Spoke to [place] with [employer]. He stated that he would upload images into PACS, as [facility] has uploaded images in power share.

## 2022-12-22 NOTE — Telephone Encounter (Signed)
Spoke to the patient over the phone and explained the results. She still has to get her swallowing study done and we will discuss all these results on follow up. I asked her to continue using her symbicort for now.  Thanks, [employer]

## 2022-12-28 ENCOUNTER — Ambulatory Visit
Admission: RE | Admit: 2022-12-28 | Discharge: 2022-12-28 | Disposition: A | Discharge status: Home / Self Care | Payer: Medicare PPO | Source: Ambulatory Visit | Attending: Student in an Organized Health Care Education/Training Program | Admitting: Student in an Organized Health Care Education/Training Program

## 2022-12-28 ENCOUNTER — Telehealth: Payer: Self-pay | Admitting: Student in an Organized Health Care Education/Training Program

## 2022-12-28 DIAGNOSIS — K219 Gastro-esophageal reflux disease without esophagitis: Secondary | ICD-10-CM | POA: Diagnosis not present

## 2022-12-28 DIAGNOSIS — R0602 Shortness of breath: Secondary | ICD-10-CM | POA: Insufficient documentation

## 2022-12-28 DIAGNOSIS — J4520 Mild intermittent asthma, uncomplicated: Secondary | ICD-10-CM

## 2022-12-28 NOTE — Telephone Encounter (Signed)
Swallowing test was done today. Patient calling for results.

## 2022-12-28 NOTE — Telephone Encounter (Signed)
Pt is stating that Dr. Aundria Allison was waiting on the swallow test to determine which meds she needed. She is asking for a call concerning that. Doesn't want to wait until September for meds

## 2022-12-30 NOTE — Telephone Encounter (Signed)
Pt. Calling wanting to hear back from Dr. Melynda Keller nurse

## 2022-12-30 NOTE — Telephone Encounter (Signed)
Dr. Dgayli, please advise. 

## 2022-12-30 NOTE — Telephone Encounter (Signed)
I spoke with the patient and told her you are out of the office until Monday. I did explain you are in the ICU on Monday but you will be able to give her the results. She is very frustrated because she said you are suppose to be taking her off some of her medications and one of them is expensive. She would like a call back on Monday to discuss her swallowing test and to discuss her medications.

## 2023-01-05 MED ORDER — FLUTICASONE-SALMETEROL 45-21 MCG/ACT IN AERO: 2.0000 | INHALATION_SPRAY | Freq: Two times a day (BID) | RESPIRATORY_TRACT | 12 refills | Status: DC

## 2023-01-05 NOTE — Telephone Encounter (Signed)
Spoke to the patient, explained results from PFT's again and from swallowing study. Discussed that her symptoms are well controlled, and she hasn't used Symbicort since Friday. Explained that normal PFT's don't necessarily mean she doesn't have asthma, but rather that asthma could be fully controlled. Discussed three options, continue inhalers as we are, continue inhaler with reduced dose, and hold inhalers and monitor for symptoms. She would like to hold inhaler but have it available if necessary, and asked for an inhaler other than Symbicort. Will send a script for Advair HFA.

## 2023-01-05 NOTE — Telephone Encounter (Signed)
Spoke to patient.  She is questioning if she has asthma? She does not feel that Symbicort is effective. If she has asthma, she would like to try a different inhaler. She stated that she can not wait until her next appt for answers. Her sx are unchanged. C/o dry cough, SOB with exertion and chest tightness.  She would like a response today.     Dr. Aundria Rud, please advise.

## 2023-01-22 ENCOUNTER — Encounter: Payer: Self-pay | Admitting: Internal Medicine

## 2023-01-22 ENCOUNTER — Ambulatory Visit (INDEPENDENT_AMBULATORY_CARE_PROVIDER_SITE_OTHER): Payer: Medicare PPO | Admitting: Internal Medicine

## 2023-01-22 VITALS — BP 138/80 | HR 81 | Temp 95.7°F | Wt 166.0 lb

## 2023-01-22 DIAGNOSIS — B351 Tinea unguium: Secondary | ICD-10-CM

## 2023-01-22 DIAGNOSIS — Z5181 Encounter for therapeutic drug level monitoring: Secondary | ICD-10-CM | POA: Diagnosis not present

## 2023-01-22 MED ORDER — TERBINAFINE HCL 250 MG PO TABS: 250.0000 mg | ORAL_TABLET | Freq: Every day | ORAL | 0 refills | Status: DC

## 2023-01-22 NOTE — Progress Notes (Signed)
Subjective:    Patient ID: Kendra Allison, female    DOB: [birth year], 67 y.o.   MRN: [id number]  HPI  Patient presents to clinic today with complaint of toenail fungus.  She noticed this 5 months ago. She has seen [facility] (Dr. Hinda Lenis) for the same, prescribed ciclopirox which she started on in July. She reports this medication is not working.   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 1 each 3   amLODipine (NORVASC) 5 MG tablet Take 1 tablet (5 mg total) by mouth daily. 180 tablet 3   Calcium Carb-Cholecalciferol (CALCIUM + VITAMIN D3 PO) Take 1 tablet by mouth daily.     celecoxib (CELEBREX) 200 MG capsule Take 1 capsule (200 mg total) by mouth 2 (two) times daily as needed. 60 capsule 3   dicyclomine (BENTYL) 10 MG capsule Take 1 capsule (10 mg total) by mouth 3 (three) times daily before meals. As needed for abdominal pain cramping bloating 30 capsule 2   estradiol (ESTRACE) 0.1 MG/GM vaginal cream Place 1 Applicatorful vaginally. Twice weekly     famotidine (PEPCID) 20 MG tablet Take 20 mg by mouth 2 (two) times daily.     fluticasone (FLONASE) 50 MCG/ACT nasal spray Place 2 sprays into both nostrils daily as needed for allergies. 16 g 5   fluticasone-salmeterol (ADVAIR HFA) 45-21 MCG/ACT inhaler Inhale 2 puffs into the lungs 2 (two) times daily. 1 each 12   hydrochlorothiazide (HYDRODIURIL) 25 MG tablet Take 1 tablet (25 mg total) by mouth daily. 90 tablet 3   Multiple Vitamin (MULTIVITAMIN WITH MINERALS) TABS  tablet Take 1 tablet by mouth daily. One-A-Day Multivitamin     rosuvastatin (CRESTOR) 10 MG tablet TAKE 1 TABLET AT BEDTIME 90 tablet 2   valsartan (DIOVAN) 160 MG tablet Take 1 tablet (160 mg total) by mouth 2 (two) times daily. 180 tablet 0   No current facility-administered medications for this visit.    No Known Allergies  No family history on file.  Social History   Socioeconomic History   Marital status: Single    Spouse name: Not on file   Number of children: 0   Years of education: Not on file   Highest education level: Associate degree: occupational, Scientist, product/process development, or vocational program  Occupational History   Occupation: Retired  Tobacco Use   Smoking status: Never   Smokeless tobacco: Never   Tobacco comments:    Quit 40years ago.   Vaping Use   Vaping status: Never Used  Substance and Sexual Activity   Alcohol use: Yes    Comment: "rarely"    Drug use: Never   Sexual activity: Not Currently  Other Topics Concern   Not on file  Social History Narrative   ** Merged History Encounter **       Social Determinants of Health   Financial Resource Strain: Low Risk  (01/19/2023)   Overall Financial Resource Strain (CARDIA)    Difficulty of Paying Living Expenses: Not hard at  all  Food Insecurity: No Food Insecurity (01/19/2023)   Hunger Vital Sign    Worried About Running Out of Food in the Last Year: Never true    Ran Out of Food in the Last Year: Never true  Transportation Needs: No Transportation Needs (01/19/2023)   PRAPARE - Administrator, Civil Service (Medical): No    Lack of Transportation (Non-Medical): No  Physical Activity: Sufficiently Active (01/19/2023)   Exercise Vital Sign    Days of Exercise per Week: 3 days    Minutes of Exercise per Session: 60 min  Stress: No Stress Concern Present (01/19/2023)   [employer] of Occupational Health - Occupational Stress Questionnaire    Feeling of Stress : Not at all  Social Connections:  Moderately Integrated (01/19/2023)   Social Connection and Isolation Panel [NHANES]    Frequency of Communication with Friends and Family: More than three times a week    Frequency of Social Gatherings with Friends and Family: Once a week    Attends Religious Services: More than 4 times per year    Active Member of [employer] or Organizations: No    Attends Engineer, structural: More than 4 times per year    Marital Status: Never married  Intimate Partner Violence: Not At Risk (09/10/2022)   Humiliation, Afraid, Rape, and Kick questionnaire    Fear of Current or Ex-Partner: No    Emotionally Abused: No    Physically Abused: No    Sexually Abused: No     Constitutional: Denies fever, malaise, fatigue, headache or abrupt weight changes.  Respiratory: Denies difficulty breathing, shortness of breath, cough or sputum production.   Cardiovascular: Denies chest pain, chest tightness, palpitations or swelling in the hands or feet.  Skin: Patient reports toenail fungus.  Denies redness, rashes, lesions or ulcercations.    No other specific complaints in a complete review of systems (except as listed in HPI above).  Objective:   Physical Exam BP 138/80 (BP Location: Right Arm, Patient Position: Sitting, Cuff Size: Normal)   Pulse 81   Temp (!) 95.7 F (35.4 C) (Temporal)   Wt 166 lb (75.3 kg)   SpO2 100%   BMI 26.79 kg/m   Wt Readings from Last 3 Encounters:  12/14/22 166 lb 6.4 oz (75.5 kg)  12/11/22 166 lb 12.8 oz (75.7 kg)  11/11/22 170 lb (77.1 kg)    General: Appears her stated age, overweight, in NAD. Skin: Fungal infection noted bilateral great toenails. Cardiovascular: Normal rate and rhythm.  Pulmonary/Chest: Normal effort and positive vesicular breath sounds.  Neurological: Alert and oriented.    BMET    Component Value Date/Time   NA 143 09/11/2022 0915   NA 139 10/13/2018 0000   K 3.9 09/11/2022 0915   CL 103 09/11/2022 0915   CO2 31 09/11/2022 0915    GLUCOSE 86 09/11/2022 0915   BUN 15 09/11/2022 0915   BUN 17 10/13/2018 0000   CREATININE 0.98 09/11/2022 0915   CALCIUM 10.3 09/11/2022 0915   GFRNONAA 71 08/21/2020 0859   GFRAA 83 08/21/2020 0859    Lipid Panel     Component Value Date/Time   CHOL 130 09/11/2022 0915   TRIG 64 09/11/2022 0915   HDL 59 09/11/2022 0915   CHOLHDL 2.2 09/11/2022 0915   LDLCALC 57 09/11/2022 0915    CBC    Component Value Date/Time   WBC 4.2 09/11/2022 0915   RBC 4.58 09/11/2022 0915   HGB 13.4 09/11/2022 0915  HCT 40.8 09/11/2022 0915   PLT 348 09/11/2022 0915   MCV 89.1 09/11/2022 0915   MCH 29.3 09/11/2022 0915   MCHC 32.8 09/11/2022 0915   RDW 12.1 09/11/2022 0915   LYMPHSABS 1,516 09/11/2022 0915   MONOABS 0.4 06/11/2020 1534   EOSABS 122 09/11/2022 0915   BASOSABS 59 09/11/2022 0915    Hgb A1C Lab Results  Component Value Date   HGBA1C 5.6 09/11/2022            Assessment & Plan:   Fungal toenails:  Will trial terbinafine 250 mg daily, black box warning for liver failure Will have her schedule a lab only appointment in 1 month to repeat liver enzymes  Follow-up with your PCP as previously scheduled Nicki Reaper, NP

## 2023-01-22 NOTE — Patient Instructions (Signed)
Preventing Toenail Fungus from Recurring   Sanitize your shoes with Mycomist spray or a similar shoe sanitizer spray.  Follow the instructions on the bottle and dry them outside in the sun or with a hairdryer.  We also recommend repeating the sanitization once weekly in shoes you wear most often.   Throw away any shoes you have worn a significant amount without socks-fungus thrives in a warm moist environment and you want to avoid re-infection after your laser procedure   Bleach your socks with regular or color safe bleach   Change your socks regularly to keep your feet clean and dry (especially if you have sweaty feet)-if sweaty feet are a problem, let your doctor know-there is a great lotion that helps with this problem.   Clean your toenail clippers with alcohol before you use them if you do your own toenails and make sure to replace Emory boards and orange sticks regularly   If you get regular pedicures, bring your own instruments or go to a spa that sterilizes their instruments in an autoclave. 

## 2023-02-02 ENCOUNTER — Other Ambulatory Visit: Payer: Self-pay

## 2023-02-02 DIAGNOSIS — M19172 Post-traumatic osteoarthritis, left ankle and foot: Secondary | ICD-10-CM

## 2023-02-02 MED ORDER — CELECOXIB 200 MG PO CAPS
200.0000 mg | ORAL_CAPSULE | Freq: Two times a day (BID) | ORAL | 3 refills | Status: DC | PRN
Start: 2023-02-02 — End: 2023-10-05

## 2023-02-11 DIAGNOSIS — B351 Tinea unguium: Secondary | ICD-10-CM | POA: Diagnosis not present

## 2023-02-17 ENCOUNTER — Ambulatory Visit: Payer: Medicare PPO | Admitting: Student in an Organized Health Care Education/Training Program

## 2023-02-17 ENCOUNTER — Telehealth: Payer: Self-pay | Admitting: Cardiology

## 2023-02-17 DIAGNOSIS — I10 Essential (primary) hypertension: Secondary | ICD-10-CM

## 2023-02-17 MED ORDER — VALSARTAN 160 MG PO TABS
160.0000 mg | ORAL_TABLET | Freq: Two times a day (BID) | ORAL | 0 refills | Status: DC
Start: 2023-02-17 — End: 2023-05-10

## 2023-02-17 NOTE — Telephone Encounter (Signed)
*  STAT* If patient is at the pharmacy, call can be transferred to refill team.   1. Which medications need to be refilled? (please list name of each medication and dose if known)   valsartan (DIOVAN) 160 MG tablet   2. Would you like to learn more about the convenience, safety, & potential cost savings by using the [facility]?   3. Are you open to using the [facility] (Type [facility]. ).  4. Which pharmacy/location (including street and city if local pharmacy) is medication to be sent to?  [facility] - [place], [place] - [address]   5. Do they need a 30 day or 90 day supply?   90 day  Patient stated she has 5 tablets left.

## 2023-02-17 NOTE — Telephone Encounter (Signed)
Requested Prescriptions   Signed Prescriptions Disp Refills   valsartan (DIOVAN) 160 MG tablet 180 tablet 0    Sig: Take 1 tablet (160 mg total) by mouth 2 (two) times daily.    Authorizing Provider: Debbe Odea    Ordering User: Guerry Minors

## 2023-02-21 DIAGNOSIS — H538 Other visual disturbances: Secondary | ICD-10-CM | POA: Diagnosis not present

## 2023-02-21 DIAGNOSIS — R739 Hyperglycemia, unspecified: Secondary | ICD-10-CM | POA: Diagnosis not present

## 2023-02-21 DIAGNOSIS — H539 Unspecified visual disturbance: Secondary | ICD-10-CM | POA: Diagnosis not present

## 2023-02-21 DIAGNOSIS — Z79899 Other long term (current) drug therapy: Secondary | ICD-10-CM | POA: Diagnosis not present

## 2023-02-21 DIAGNOSIS — R0789 Other chest pain: Secondary | ICD-10-CM | POA: Diagnosis not present

## 2023-02-21 DIAGNOSIS — I119 Hypertensive heart disease without heart failure: Secondary | ICD-10-CM | POA: Diagnosis not present

## 2023-02-21 DIAGNOSIS — R079 Chest pain, unspecified: Secondary | ICD-10-CM | POA: Diagnosis not present

## 2023-02-21 DIAGNOSIS — R9431 Abnormal electrocardiogram [ECG] [EKG]: Secondary | ICD-10-CM | POA: Diagnosis not present

## 2023-02-21 DIAGNOSIS — Z87891 Personal history of nicotine dependence: Secondary | ICD-10-CM | POA: Diagnosis not present

## 2023-02-21 DIAGNOSIS — R519 Headache, unspecified: Secondary | ICD-10-CM | POA: Diagnosis not present

## 2023-02-21 DIAGNOSIS — Z5181 Encounter for therapeutic drug level monitoring: Secondary | ICD-10-CM | POA: Diagnosis not present

## 2023-02-22 ENCOUNTER — Other Ambulatory Visit: Payer: Medicare PPO

## 2023-02-22 DIAGNOSIS — Z5181 Encounter for therapeutic drug level monitoring: Secondary | ICD-10-CM

## 2023-02-24 ENCOUNTER — Ambulatory Visit (INDEPENDENT_AMBULATORY_CARE_PROVIDER_SITE_OTHER): Payer: Medicare PPO | Admitting: Family Medicine

## 2023-02-24 ENCOUNTER — Encounter: Payer: Self-pay | Admitting: Family Medicine

## 2023-02-24 VITALS — BP 148/90 | HR 87 | Ht 66.0 in | Wt 166.0 lb

## 2023-02-24 DIAGNOSIS — Z23 Encounter for immunization: Secondary | ICD-10-CM

## 2023-02-24 DIAGNOSIS — B351 Tinea unguium: Secondary | ICD-10-CM

## 2023-02-24 DIAGNOSIS — J0110 Acute frontal sinusitis, unspecified: Secondary | ICD-10-CM

## 2023-02-24 DIAGNOSIS — R051 Acute cough: Secondary | ICD-10-CM

## 2023-02-24 DIAGNOSIS — B379 Candidiasis, unspecified: Secondary | ICD-10-CM | POA: Diagnosis not present

## 2023-02-24 LAB — POC COVID19 BINAXNOW: SARS Coronavirus 2 Ag: NEGATIVE

## 2023-02-24 LAB — POCT INFLUENZA A/B
Influenza A, POC: NEGATIVE
Influenza B, POC: NEGATIVE

## 2023-02-24 MED ORDER — FLUCONAZOLE 150 MG PO TABS: 150.0000 mg | ORAL_TABLET | Freq: Every day | ORAL | 1 refills | Status: DC

## 2023-02-24 MED ORDER — LEVOFLOXACIN 500 MG PO TABS
500.0000 mg | ORAL_TABLET | Freq: Every day | ORAL | 0 refills | Status: DC
Start: 2023-02-24 — End: 2023-05-04

## 2023-02-24 NOTE — Progress Notes (Signed)
Subjective:    Patient ID: Kendra Allison, female    DOB: [birth year], 67 y.o.   MRN: [id number]  Kendra Allison is a 67 y.o. female presenting on 02/24/2023 for Vaginitis (Follow up yeast infection/), ER follow up (Seen for cough on Sunday), Cough, Sore Throat (/), Headache, and Ear Pain (Bilateral ears)   HPI  Discussed the use of AI scribe software for clinical note transcription with the patient, who gave verbal consent to proceed.     The patient, with a history of fungal infection, presented with a constellation of symptoms that began last Sunday. They reported waking up with a severe headache, facial pain, chest pain, shortness of breath, and diarrhea. She went to [facility] ED, the patient continues to experience headaches, though less severe, a scratchy throat, post-nasal drainage, and ongoing diarrhea.  In addition to these symptoms, the patient has been dealing with a persistent fungal infection affecting their toenails. They have been using a topical treatment prescribed by a foot doctor, but the infection has not cleared up. She took Terbinafine for 1 month, had labs normal LFT. She used to take Diflucan in the past years ago for a course for fungal infection. Requesting a similar course  The patient also reported a bothersome cough that seems to worsen during sleep due to post-nasal drainage. They have been using a sinus rinse and taking over-the-counter allergy medication, but these interventions have not provided significant relief.     Prior to this, they had visited a dentist due to gum soreness when chewing. The dentist found no obvious cause for the discomfort  I have reviewed the discharge medication list, and have reconciled the current and discharge medications today.   Health Maintenance: Due Flu Shot     10 /06/2022    4:09 PM 11/11/2022    3:39 PM 09/10/2022    2:08 PM  Depression screen PHQ 2/9  Decreased Interest 0 0 0  Down, Depressed, Hopeless 0 0 0  PHQ - 2  Score 0 0 0  Altered sleeping 3  0  Tired, decreased energy 3  0  Change in appetite 0  0  Feeling bad or failure about yourself  0  0  Trouble concentrating 0  0  Moving slowly or fidgety/restless 0  0  Suicidal thoughts 0  0  PHQ-9 Score 6  0  Difficult doing work/chores Not difficult at all  Not difficult at all    Social History   Tobacco Use   Smoking status: Never   Smokeless tobacco: Never   Tobacco comments:    Quit 40years ago.   Vaping Use   Vaping status: Never Used  Substance Use Topics   Alcohol use: Yes    Comment: "rarely"    Drug use: Never    Review of Systems Per HPI unless specifically indicated above     Objective:    BP (!) 148/90 (BP Location: Left Arm, Cuff Size: Normal)   Pulse 87   Ht 5\' 6"  (1.676 m)   Wt 166 lb (75.3 kg)   SpO2 97%   BMI 26.79 kg/m   Wt Readings from Last 3 Encounters:  02/24/23 166 lb (75.3 kg)  01/22/23 166 lb (75.3 kg)  12/14/22 166 lb 6.4 oz (75.5 kg)    Physical Exam Vitals and nursing note reviewed.  Constitutional:      General: She is not in acute distress.    Appearance: Normal appearance. She is well-developed. She is not diaphoretic.  Comments: Well-appearing, comfortable, cooperative  HENT:     Head: Normocephalic and atraumatic.  Eyes:     General:        Right eye: No discharge.        Left eye: No discharge.     Conjunctiva/sclera: Conjunctivae normal.  Cardiovascular:     Rate and Rhythm: Normal rate.  Pulmonary:     Effort: Pulmonary effort is normal.  Skin:    General: Skin is warm and dry.     Findings: No erythema or rash.  Neurological:     Mental Status: She is alert and oriented to person, place, and time.  Psychiatric:        Mood and Affect: Mood normal.        Behavior: Behavior normal.        Thought Content: Thought content normal.     Comments: Well groomed, good eye contact, normal speech and thoughts       Results for orders placed or performed in visit on 02/24/23   POC COVID-19  Result Value Ref Range   SARS Coronavirus 2 Ag Negative Negative  POCT Influenza A/B  Result Value Ref Range   Influenza A, POC Negative Negative   Influenza B, POC Negative Negative      Assessment & Plan:   Problem List Items Addressed This Visit   None Visit Diagnoses     Toenail fungus    -  Primary   Relevant Medications   fluconazole (DIFLUCAN) 150 MG tablet   Need for influenza vaccination       Relevant Orders   Flu Vaccine Trivalent High Dose (Fluad) (Completed)   Yeast infection       Relevant Medications   fluconazole (DIFLUCAN) 150 MG tablet   Acute non-recurrent frontal sinusitis       Relevant Medications   fluconazole (DIFLUCAN) 150 MG tablet   levofloxacin (LEVAQUIN) 500 MG tablet   Acute cough       Relevant Orders   POC COVID-19 (Completed)   POCT Influenza A/B (Completed)       Assessment and Plan    Fungal Infection Candidal infection with history of onychomycosis as well Prior course with Fluconazole longer duration. Discussed diagnostic uncertainty but agree to trial same as her previous treatment. Will DC Terbinafine at this time. -Start Fluconazole 150mg  daily for 2 weeks with a refill if needed. -Continue topical treatment as prescribed by dermatologist.  Sinusitis Negative COVID Flu Test today Persistent headache, sore throat, and postnasal drainage. Patient has been using sinus rinse and Zyrtec with limited relief. -Consider starting Levofloxacin after completion of Fluconazole treatment if symptoms persist.  General Health Maintenance -Administer influenza vaccine today.        Meds ordered this encounter  Medications   fluconazole (DIFLUCAN) 150 MG tablet    Sig: Take 1 tablet (150 mg total) by mouth daily. For 2 weeks, may repeat course if need.    Dispense:  14 tablet    Refill:  1   levofloxacin (LEVAQUIN) 500 MG tablet    Sig: Take 1 tablet (500 mg total) by mouth daily. For 7 days    Dispense:  7 tablet     Refill:  0      Follow up plan: Return if symptoms worsen or fail to improve.   Saralyn Pilar, DO [facility] 02/24/2023, 4:31 PM

## 2023-02-24 NOTE — Patient Instructions (Addendum)
Thank you for coming to the office today.  Fluconazole course as we discussed for yeast infection / fungal infection.  If not improving can use Levaquin course for sinuses, I would not take both at same time.  Flu Shot.  Please schedule a Follow-up Appointment to: Return if symptoms worsen or fail to improve.  If you have any other questions or concerns, please feel free to call the office or send a message through MyChart. You may also schedule an earlier appointment if necessary.  Additionally, you may be receiving a survey about your experience at our office within a few days to 1 week by e-mail or mail. We value your feedback.  Saralyn Pilar, DO [facility], [place]

## 2023-02-25 NOTE — Addendum Note (Signed)
Addended by: Smitty Cords on: 02/25/2023 01:10 AM   Modules accepted: Level of Service

## 2023-03-06 ENCOUNTER — Other Ambulatory Visit: Payer: Self-pay | Admitting: Family Medicine

## 2023-03-06 DIAGNOSIS — E782 Mixed hyperlipidemia: Secondary | ICD-10-CM

## 2023-03-08 NOTE — Telephone Encounter (Signed)
Requested Prescriptions  Pending Prescriptions Disp Refills   rosuvastatin (CRESTOR) 10 MG tablet [Pharmacy Med Name: Rosuvastatin Calcium Oral Tablet 10 MG] 90 tablet 0    Sig: TAKE 1 TABLET AT BEDTIME     Cardiovascular:  Antilipid - Statins 2 Failed - 03/06/2023 11:52 AM      Failed - Lipid Panel in normal range within the last 12 months    Cholesterol  Date Value Ref Range Status  09/11/2022 130 <200 mg/dL Final   LDL Cholesterol (Calc)  Date Value Ref Range Status  09/11/2022 57 mg/dL (calc) Final    Comment:    Reference range: <100 . Desirable range <100 mg/dL for primary prevention;   <70 mg/dL for patients with CHD or diabetic patients  with > or = 2 CHD risk factors. Marland Kitchen LDL-C is now calculated using the Martin-Hopkins  calculation, which is a validated novel method providing  better accuracy than the Friedewald equation in the  estimation of LDL-C.  Horald Pollen et al. Lenox Ahr. 6433;295(18): 2061-2068  (http://education.[facility].com/faq/FAQ164)    HDL  Date Value Ref Range Status  09/11/2022 59 > OR = 50 mg/dL Final   Triglycerides  Date Value Ref Range Status  09/11/2022 64 <150 mg/dL Final         Passed - Cr in normal range and within 360 days    Creat  Date Value Ref Range Status  09/11/2022 0.98 0.50 - 1.05 mg/dL Final         Passed - Patient is not pregnant      Passed - Valid encounter within last 12 months    Recent Outpatient Visits           1 week ago Toenail fungus   [place] [facility] [place], Netta Neat, DO   1 month ago Toenail fungus   [place] [facility] [place], [place] W, NP   3 months ago Acute cystitis with hematuria   [place] [facility] [place], Netta Neat, DO   5 months ago Essential hypertension   [place] [facility] Smitty Cords, DO   6 months ago Essential hypertension   [place] [facility] [place],  Jackelyn Poling, RPH-CPP       Future Appointments             In 1 week Debbe Odea, MD [facility]

## 2023-03-10 ENCOUNTER — Other Ambulatory Visit: Payer: Self-pay | Admitting: Obstetrics & Gynecology

## 2023-03-10 DIAGNOSIS — N644 Mastodynia: Secondary | ICD-10-CM | POA: Diagnosis not present

## 2023-03-10 DIAGNOSIS — L292 Pruritus vulvae: Secondary | ICD-10-CM | POA: Diagnosis not present

## 2023-03-10 DIAGNOSIS — B3731 Acute candidiasis of vulva and vagina: Secondary | ICD-10-CM | POA: Diagnosis not present

## 2023-03-10 DIAGNOSIS — N898 Other specified noninflammatory disorders of vagina: Secondary | ICD-10-CM | POA: Diagnosis not present

## 2023-03-15 ENCOUNTER — Ambulatory Visit: Payer: Medicare PPO | Admitting: Cardiology

## 2023-04-13 DIAGNOSIS — S82872D Displaced pilon fracture of left tibia, subsequent encounter for closed fracture with routine healing: Secondary | ICD-10-CM | POA: Diagnosis not present

## 2023-04-13 DIAGNOSIS — S82202F Unspecified fracture of shaft of left tibia, subsequent encounter for open fracture type IIIA, IIIB, or IIIC with routine healing: Secondary | ICD-10-CM | POA: Diagnosis not present

## 2023-04-15 ENCOUNTER — Other Ambulatory Visit: Payer: Self-pay | Admitting: Family Medicine

## 2023-04-15 DIAGNOSIS — J9801 Acute bronchospasm: Secondary | ICD-10-CM

## 2023-04-16 NOTE — Telephone Encounter (Signed)
Requested Prescriptions  Pending Prescriptions Disp Refills   albuterol (VENTOLIN HFA) 108 (90 Base) MCG/ACT inhaler [Pharmacy Med Name: Albuterol Sulfate HFA Inhalation Aerosol Solution 108 (90 Base) MCG/ACT] 18 g 0    Sig: INHALE 2 PUFFS EVERY 4 HOURS AS NEEDED FOR WHEEZING OR SHORTNESS OF BREATH (COUGH)     Pulmonology:  Beta Agonists 2 Failed - 04/15/2023 10:05 AM      Failed - Last BP in normal range    BP Readings from Last 1 Encounters:  02/24/23 (!) 148/90         Passed - Last Heart Rate in normal range    Pulse Readings from Last 1 Encounters:  02/24/23 87         Passed - Valid encounter within last 12 months    Recent Outpatient Visits           1 month ago Toenail fungus   [place] [facility] [place], Netta Neat, DO   2 months ago Toenail fungus   [place] [facility] [place], [place] W, NP   5 months ago Acute cystitis with hematuria   [place] [facility] Smitty Cords, DO   7 months ago Essential hypertension   [place] [facility] Smitty Cords, DO   7 months ago Essential hypertension   [place] [facility] [place], Jackelyn Poling, RPH-CPP       Future Appointments             In 2 weeks Agbor-Etang, Arlys John, MD [facility]

## 2023-04-26 ENCOUNTER — Ambulatory Visit: Payer: Self-pay | Admitting: *Deleted

## 2023-04-26 NOTE — Telephone Encounter (Signed)
Summary: diabetic?   Per agent: "Pt called in wanting to have blood work done. Pt states that she need blood work done to check to see if she has diabetes. Symptoms: blurred vision, thirsty, yeast, fungus infection (her OBGYN said this is signs of being diabetic), dark skin. Please advise."     Chief Complaint: Concern of diabetes. Symptoms: Blurred vision, fatigue,frequent urination, fungal infection, tingling in fingers.  Frequency: "Many months." Pertinent Negatives: Patient denies  Disposition: [] ED /[] Urgent Care (no appt availability in office) / [x] Appointment(In office/virtual)/ []  [facility]/ [] Home Care/ [] Refused Recommended Disposition /[] [place] [facility]/ []  Follow-up with PCP Additional Notes:  Pt states OB/GYN suggested she see PCP for lab work, "I might be diabetic with these symptoms."  Called for lab appt. Advised should be seen by PCP first. Pt requested earliest appt "I'll have to fast for the blood work."  Pt requested the 9am appt Dec. 19th. Prefers to see Dr. Althea Charon.  Pt asking to be seen sooner "Only if an early appt available."  Placed on wait list. Advised to CB if symptoms worsen. Verbalizes understanding.   Reason for Disposition  Nursing judgment or information in reference  Answer Assessment - Initial Assessment Questions 1. REASON FOR CALL: "What is your main concern right now?"     "I might have diabetes." 2. ONSET: "When did the  start?"     "Many months ago." 3. SEVERITY: "How bad is the ?"     Varies 4. FEVER: "Do you have a fever?"     No 5. OTHER SYMPTOMS: "Do you have any other new symptoms?"     Frequent urination, blurred vision, fatigue, fungal infection, tingling in fingers.  Protocols used: No Guideline Available-A-AH

## 2023-05-04 ENCOUNTER — Ambulatory Visit: Payer: Medicare PPO | Attending: Cardiology | Admitting: Cardiology

## 2023-05-04 ENCOUNTER — Encounter: Payer: Self-pay | Admitting: Cardiology

## 2023-05-04 VITALS — BP 152/88 | HR 67 | Ht 66.0 in | Wt 167.4 lb

## 2023-05-04 DIAGNOSIS — E7800 Pure hypercholesterolemia, unspecified: Secondary | ICD-10-CM | POA: Diagnosis not present

## 2023-05-04 DIAGNOSIS — I10 Essential (primary) hypertension: Secondary | ICD-10-CM | POA: Diagnosis not present

## 2023-05-04 DIAGNOSIS — I2510 Atherosclerotic heart disease of native coronary artery without angina pectoris: Secondary | ICD-10-CM | POA: Diagnosis not present

## 2023-05-04 MED ORDER — AMLODIPINE BESYLATE 10 MG PO TABS: 10.0000 mg | ORAL_TABLET | Freq: Every day | ORAL | Status: DC

## 2023-05-04 NOTE — Progress Notes (Signed)
Cardiology Office Note:    Date:  05/04/2023   ID:  Kendra Allison, DOB 17-Oct-1955, MRN [id number]  PCP:  Smitty Cords, DO   [facility] Providers Cardiologist:  Debbe Odea, MD     Referring MD: Saralyn Pilar *   Chief Complaint  Patient presents with   Follow-up    Patient denies new or acute cardiac problems/concerns today.    History of Present Illness:    Kendra Allison is a 67 y.o. female with a hx of hypertension, hyperlipidemia, minimal nonobstructive mid LAD coronary calcification, GERD, asthma who presents for follow-up.  Last seen with elevated blood pressures, Norvasc 5 mg daily was started.  Compliant with already prescribed valsartan 160 mg twice daily, HCTZ 25 mg daily.  Overall her blood pressures have improved at home with systolics in the 130s to 140s on average.  Tolerating medications with no adverse effects.  Plans to obtain repeat fasting lipid profile next week with PCP.  Complains of a left shoulder ache/pain.   Prior notes/studies CCTA 5/24 minimal LAD calcifications (<25%) Echo 5/24 EF 60 to 65%, impaired relaxation, moderate LAE.   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   Osteoporosis    Sebaceous cyst 06/13/2021    Past Surgical History:  Procedure Laterality Date   ABDOMINAL HYSTERECTOMY     BREAST BIOPSY     COLONOSCOPY WITH PROPOFOL N/A 08/10/2019   Procedure: COLONOSCOPY WITH PROPOFOL;  Surgeon: Wyline Mood, MD;  Location: [facility] ENDOSCOPY;  Service: Gastroenterology;  Laterality: N/A;   ESOPHAGOGASTRODUODENOSCOPY (EGD) WITH PROPOFOL N/A 08/10/2019   Procedure: ESOPHAGOGASTRODUODENOSCOPY (EGD) WITH PROPOFOL;  Surgeon: Wyline Mood, MD;  Location: [facility] ENDOSCOPY;  Service: Gastroenterology;  Laterality: N/A;   EXTERNAL FIXATION LEG  Left 04/07/2020    EXTERNAL FIXATION LEG (Left Leg Lower)   EXTERNAL FIXATION LEG Left 04/07/2020   Procedure: EXTERNAL FIXATION LEG;  Surgeon: Roby Lofts, MD;  Location: [facility] OR;  Service: Orthopedics;  Laterality: Left;   EXTERNAL FIXATION REMOVAL Left 04/10/2020   Procedure: REMOVAL EXTERNAL FIXATION LEG;  Surgeon: Roby Lofts, MD;  Location: [facility] OR;  Service: Orthopedics;  Laterality: Left;   I & D EXTREMITY Left 04/07/2020   Procedure: IRRIGATION AND DEBRIDEMENT EXTREMITY;  Surgeon: Roby Lofts, MD;  Location: [facility] OR;  Service: Orthopedics;  Laterality: Left;   I & D EXTREMITY Left 06/12/2020   Procedure: IRRIGATION AND DEBRIDEMENT EXTREMITY;  Surgeon: Roby Lofts, MD;  Location: [facility] OR;  Service: Orthopedics;  Laterality: Left;   Nissem  2014   TIBIA IM NAIL INSERTION Left 04/10/2020   Procedure: INTRAMEDULLARY (IM) NAIL TIBIAL;  Surgeon: Roby Lofts, MD;  Location: [facility] OR;  Service: Orthopedics;  Laterality: Left;    Current Medications: Current Meds  Medication Sig   albuterol (VENTOLIN HFA) 108 (90 Base) MCG/ACT inhaler INHALE 2 PUFFS EVERY 4 HOURS AS NEEDED FOR WHEEZING OR SHORTNESS OF BREATH (COUGH)   Calcium Carb-Cholecalciferol (CALCIUM + VITAMIN D3 PO) Take 1 tablet by mouth daily.   celecoxib (CELEBREX) 200 MG capsule Take 1 capsule (200 mg total) by mouth 2 (two) times daily as needed.   estradiol (ESTRACE) 0.1 MG/GM vaginal cream Place 1 Applicatorful vaginally. Twice weekly   famotidine (PEPCID) 20 MG tablet Take 20 mg by mouth 2 (two) times daily.  fluticasone (FLONASE) 50 MCG/ACT nasal spray Place 2 sprays into both nostrils daily as needed for allergies.   fluticasone-salmeterol (ADVAIR HFA) 45-21 MCG/ACT inhaler Inhale 2 puffs into the lungs 2 (two) times daily.   hydrochlorothiazide (HYDRODIURIL) 25 MG tablet Take 1 tablet (25 mg total) by mouth daily.   Multiple Vitamin (MULTIVITAMIN WITH MINERALS) TABS tablet Take 1 tablet by mouth daily. One-A-Day  Multivitamin   rosuvastatin (CRESTOR) 10 MG tablet TAKE 1 TABLET AT BEDTIME   valsartan (DIOVAN) 160 MG tablet Take 1 tablet (160 mg total) by mouth 2 (two) times daily.   [DISCONTINUED] amLODipine (NORVASC) 5 MG tablet Take 1 tablet (5 mg total) by mouth daily.     Allergies:   Patient has no known allergies.   Social History   Socioeconomic History   Marital status: Single    Spouse name: Not on file   Number of children: 0   Years of education: Not on file   Highest education level: Associate degree: occupational, Scientist, product/process development, or vocational program  Occupational History   Occupation: Retired  Tobacco Use   Smoking status: Never   Smokeless tobacco: Never   Tobacco comments:    Quit 40years ago.   Vaping Use   Vaping status: Never Used  Substance and Sexual Activity   Alcohol use: Yes    Comment: "rarely"    Drug use: Never   Sexual activity: Not Currently  Other Topics Concern   Not on file  Social History Narrative   ** Merged History Encounter **       Social Determinants of Health   Financial Resource Strain: Low Risk  (01/19/2023)   Overall Financial Resource Strain (CARDIA)    Difficulty of Paying Living Expenses: Not hard at all  Food Insecurity: No Food Insecurity (01/19/2023)   Hunger Vital Sign    Worried About Running Out of Food in the Last Year: Never true    Ran Out of Food in the Last Year: Never true  Transportation Needs: No Transportation Needs (01/19/2023)   PRAPARE - Administrator, Civil Service (Medical): No    Lack of Transportation (Non-Medical): No  Physical Activity: Sufficiently Active (01/19/2023)   Exercise Vital Sign    Days of Exercise per Week: 3 days    Minutes of Exercise per Session: 60 min  Stress: No Stress Concern Present (01/19/2023)   [employer] of Occupational Health - Occupational Stress Questionnaire    Feeling of Stress : Not at all  Social Connections: Moderately Integrated (01/19/2023)   Social  Connection and Isolation Panel [NHANES]    Frequency of Communication with Friends and Family: More than three times a week    Frequency of Social Gatherings with Friends and Family: Once a week    Attends Religious Services: More than 4 times per year    Active Member of [employer] or Organizations: No    Attends Engineer, structural: More than 4 times per year    Marital Status: Never married     Family History: The patient's family history is not on file.  ROS:   Please see the history of present illness.     All other systems reviewed and are negative.  EKGs/Labs/Other Studies Reviewed:    The following studies were reviewed today:   EKG Interpretation Date/Time:  Tuesday May 04 2023 08:27:24 EST Ventricular Rate:  67 PR Interval:  154 QRS Duration:  78 QT Interval:  414 QTC Calculation: 437 R Axis:   -  21  Text Interpretation: Normal sinus rhythm Low voltage QRS Confirmed by Debbe Odea ([id number]) on 05/04/2023 8:30:24 AM    Recent Labs: 09/11/2022: ALT 20; BUN 15; Creat 0.98; Hemoglobin 13.4; Platelets 348; Potassium 3.9; Sodium 143; TSH 2.02  Recent Lipid Panel    Component Value Date/Time   CHOL 130 09/11/2022 0915   TRIG 64 09/11/2022 0915   HDL 59 09/11/2022 0915   CHOLHDL 2.2 09/11/2022 0915   LDLCALC 57 09/11/2022 0915     Risk Assessment/Calculations:     Physical Exam:    VS:  BP (!) 152/88 (BP Location: Left Arm, Patient Position: Sitting, Cuff Size: Large)   Pulse 67   Ht 5\' 6"  (1.676 m)   Wt 167 lb 6.4 oz (75.9 kg)   SpO2 98%   BMI 27.02 kg/m     Wt Readings from Last 3 Encounters:  05/04/23 167 lb 6.4 oz (75.9 kg)  02/24/23 166 lb (75.3 kg)  01/22/23 166 lb (75.3 kg)     GEN:  Well nourished, well developed in no acute distress HEENT: Normal NECK: No JVD; No carotid bruits CARDIAC: RRR, no murmurs, rubs, gallops RESPIRATORY:  Clear to auscultation without rales, wheezing or rhonchi  ABDOMEN: Soft, non-tender,  non-distended MUSCULOSKELETAL:  No edema; No deformity  SKIN: Warm and dry NEUROLOGIC:  Alert and oriented x 3 PSYCHIATRIC:  Normal affect   ASSESSMENT:    1. Primary hypertension   2. Coronary artery calcification   3. Pure hypercholesterolemia    PLAN:    In order of problems listed above:  Hypertension, BP improved but still elevated.  Increase Norvasc to 10 mg daily, cont valsartan 160 mg twice daily.  Continue HCTZ 25 mg daily.   Nonobstructive mid LAD coronary calcification, calcium score 39.1,. EF 60 to 65%, LAE.  LDL at goal.  Continue Crestor.  Shoulder pain may be arthritic in etiology.  Follow-up with PCP regarding this. Hyperlipidemia, Continue Crestor 10 mg daily.  Plan for fasting lipid profile next week with PCP.  Titrate Crestor if cholesterol not adequately controlled.  Follow-up in 3 months     Medication Adjustments/Labs and Tests Ordered: Current medicines are reviewed at length with the patient today.  Concerns regarding medicines are outlined above.  Orders Placed This Encounter  Procedures   EKG 12-Lead   Meds ordered this encounter  Medications   amLODipine (NORVASC) 10 MG tablet    Sig: Take 1 tablet (10 mg total) by mouth daily.    Patient Instructions  Medication Instructions:   INCREASE Take one tablet ( 10mg ) by mouth daily.   *If you need a refill on your cardiac medications before your next appointment, please call your pharmacy*   Lab Work:  None Ordered  If you have labs (blood work) drawn today and your tests are completely normal, you will receive your results only by: MyChart Message (if you have MyChart) OR A paper copy in the mail If you have any lab test that is abnormal or we need to change your treatment, we will call you to review the results.   Testing/Procedures:  None Ordered   Follow-Up: At [facility], you and your health needs are our priority.  As part of our continuing mission to provide you with  exceptional heart care, we have created designated Provider Care Teams.  These Care Teams include your primary Cardiologist (physician) and Advanced Practice Providers (APPs -  Physician Assistants and Nurse Practitioners) who all work together to provide you  with the care you need, when you need it.  We recommend signing up for the patient portal called "MyChart".  Sign up information is provided on this After Visit Summary.  MyChart is used to connect with patients for Virtual Visits (Telemedicine).  Patients are able to view lab/test results, encounter notes, upcoming appointments, etc.  Non-urgent messages can be sent to your provider as well.   To learn more about what you can do with MyChart, go to [email or web address].    Your next appointment:   3 month(s)  Provider:   You may see Debbe Odea, MD or one of the following Advanced Practice Providers on your designated Care Team:   Nicolasa Ducking, NP Eula Listen, PA-C Cadence Fransico Michael, PA-C Charlsie Quest, NP Carlos Levering, NP   Signed, Debbe Odea, MD  05/04/2023 9:28 AM    [facility]

## 2023-05-04 NOTE — Patient Instructions (Signed)
Medication Instructions:   INCREASE Take one tablet ( 10mg ) by mouth daily.   *If you need a refill on your cardiac medications before your next appointment, please call your pharmacy*   Lab Work:  None Ordered  If you have labs (blood work) drawn today and your tests are completely normal, you will receive your results only by: MyChart Message (if you have MyChart) OR A paper copy in the mail If you have any lab test that is abnormal or we need to change your treatment, we will call you to review the results.   Testing/Procedures:  None Ordered   Follow-Up: At [facility], you and your health needs are our priority.  As part of our continuing mission to provide you with exceptional heart care, we have created designated Provider Care Teams.  These Care Teams include your primary Cardiologist (physician) and Advanced Practice Providers (APPs -  Physician Assistants and Nurse Practitioners) who all work together to provide you with the care you need, when you need it.  We recommend signing up for the patient portal called "MyChart".  Sign up information is provided on this After Visit Summary.  MyChart is used to connect with patients for Virtual Visits (Telemedicine).  Patients are able to view lab/test results, encounter notes, upcoming appointments, etc.  Non-urgent messages can be sent to your provider as well.   To learn more about what you can do with MyChart, go to [email or web address].    Your next appointment:   3 month(s)  Provider:   You may see Debbe Odea, MD or one of the following Advanced Practice Providers on your designated Care Team:   Nicolasa Ducking, NP Eula Listen, PA-C Cadence Fransico Michael, PA-C Charlsie Quest, NP Carlos Levering, NP

## 2023-05-10 ENCOUNTER — Other Ambulatory Visit: Payer: Self-pay

## 2023-05-10 DIAGNOSIS — I10 Essential (primary) hypertension: Secondary | ICD-10-CM

## 2023-05-10 MED ORDER — VALSARTAN 160 MG PO TABS: 160.0000 mg | ORAL_TABLET | Freq: Two times a day (BID) | ORAL | 0 refills | Status: DC

## 2023-05-13 ENCOUNTER — Ambulatory Visit: Payer: Medicare PPO | Admitting: Family Medicine

## 2023-05-14 ENCOUNTER — Other Ambulatory Visit: Payer: Self-pay | Admitting: Family Medicine

## 2023-05-14 ENCOUNTER — Ambulatory Visit (INDEPENDENT_AMBULATORY_CARE_PROVIDER_SITE_OTHER): Payer: Medicare PPO | Admitting: Family Medicine

## 2023-05-14 ENCOUNTER — Encounter: Payer: Self-pay | Admitting: Family Medicine

## 2023-05-14 VITALS — BP 130/88 | HR 68 | Ht 66.0 in | Wt 168.0 lb

## 2023-05-14 DIAGNOSIS — R7309 Other abnormal glucose: Secondary | ICD-10-CM | POA: Diagnosis not present

## 2023-05-14 DIAGNOSIS — E7800 Pure hypercholesterolemia, unspecified: Secondary | ICD-10-CM

## 2023-05-14 DIAGNOSIS — N3281 Overactive bladder: Secondary | ICD-10-CM

## 2023-05-14 DIAGNOSIS — R350 Frequency of micturition: Secondary | ICD-10-CM | POA: Diagnosis not present

## 2023-05-14 DIAGNOSIS — B379 Candidiasis, unspecified: Secondary | ICD-10-CM | POA: Diagnosis not present

## 2023-05-14 DIAGNOSIS — I10 Essential (primary) hypertension: Secondary | ICD-10-CM | POA: Diagnosis not present

## 2023-05-14 LAB — POCT URINALYSIS DIPSTICK
Bilirubin, UA: NEGATIVE
Blood, UA: NEGATIVE
Glucose, UA: NEGATIVE
Ketones, UA: NEGATIVE
Leukocytes, UA: NEGATIVE
Nitrite, UA: NEGATIVE
Protein, UA: POSITIVE — AB
Spec Grav, UA: 1.015 (ref 1.010–1.025)
Urobilinogen, UA: 0.2 U/dL
pH, UA: 7.5 (ref 5.0–8.0)

## 2023-05-14 MED ORDER — OXYBUTYNIN CHLORIDE ER 5 MG PO TB24: 5.0000 mg | ORAL_TABLET | Freq: Every day | ORAL | 0 refills | Status: DC

## 2023-05-14 MED ORDER — FLUCONAZOLE 150 MG PO TABS: 150.0000 mg | ORAL_TABLET | Freq: Every day | ORAL | 0 refills | Status: DC

## 2023-05-14 NOTE — Progress Notes (Signed)
Subjective:    Patient ID: Kendra Allison, female    DOB: 19-Apr-1956, 67 y.o.   MRN: [id number]  Kendra Allison is a 67 y.o. female presenting on 05/14/2023 for Hypertension, Hyperlipidemia, and Diabetes   HPI  Discussed the use of AI scribe software for clinical note transcription with the patient, who gave verbal consent to proceed.  History of Present Illness     The patient presents with concerns of potential diabetes, citing a variety of symptoms including numbness and tingling in the hands, feet, and left arm. The left arm numbness is described as episodic, causing significant pain and limiting movement. The patient also reports frequent urination, particularly at night, disrupting her sleep. Despite previous treatment with antibiotics, the symptoms have recurred.  The patient has previously seen a urologist for her urinary symptoms and a dermatologist for her skin concerns, but no definitive diagnoses have been made. She has also undergone a procedure to investigate her bladder function, but the results were inconclusive. The patient is proactive in seeking medical advice and is keen to explore all potential avenues to improve her health.      Left Arm Paresthesia / Pain Frequently bothered by Left arm waves of paresthesia pins needles with pain, worse with lifting arm up. Often wake up and difficulty moving arm.  Nocturia Reports frequent urination nightly. Previously had UTI and resolved w/ antibiotics before. Increased thirst Question OAB  Additional symptoms  Dry Skin generalized Also throat and eyes dry it seems She has seen dry eyes. Given eye drops and use warm compresses for eyes Blurry vision  The patient also describes persistent dry skin, even after frequent application of lotion, and dry eyes that have not improved with prescribed eye drops. She has noticed dark patches on her skin, particularly on her leg where she had a previous surgical incision. Darkened  skin on her scar on lower leg from prior surgery.   Onychocmycosis Toenail  Yeast Candidiasis The patient has a history of fungal infections, affecting her nails and resulting in recurrent vaginal yeast infections. Despite treatment with fluconazole, the symptoms have not fully resolved and the nail discoloration has returned. Seems to have recurrent vaginal yeast infections Took Diflucan 2 week with resolution then returned  Insomnia She takes Melatonin 10-15 mg dose  CHRONIC HTN: On Valsartan 160mg  TWICE A DAY, hydrochlorothiazide 25mg  daily, Amlodipine 10mg  daily Reports good compliance, took meds today. Tolerating well, w/o complaints. Denies CP, dyspnea, HA, edema, dizziness / lightheadedness   Hyperlipidemia On Rosuvastatin, 10mg  Due for lab   Anemia She is taking iron pills / beet juice.  Lab showed CBC normal range without anemia.        05/14/2023    8:59 AM 02/24/2023    4:09 PM 11/11/2022    3:39 PM  Depression screen PHQ 2/9  Decreased Interest 0 0 0  Down, Depressed, Hopeless 0 0 0  PHQ - 2 Score 0 0 0  Altered sleeping 3 3   Tired, decreased energy 3 3   Change in appetite 0 0   Feeling bad or failure about yourself  0 0   Trouble concentrating 0 0   Moving slowly or fidgety/restless 0 0   Suicidal thoughts 0 0   PHQ-9 Score 6 6   Difficult doing work/chores  Not difficult at all        05/14/2023    8:59 AM 02/24/2023    4:09 PM 11/11/2022    3:39 PM 09/11/2022  8:28 AM  GAD 7 : Generalized Anxiety Score  Nervous, Anxious, on Edge 0 0 0 0  Control/stop worrying 0 0 0 0  Worry too much - different things 0 0 0 0  Trouble relaxing 0 0 0 0  Restless 0 0 0 0  Easily annoyed or irritable 0 0 0 0  Afraid - awful might happen 0  0 0  Total GAD 7 Score 0  0 0  Anxiety Difficulty    Not difficult at all    Social History   Tobacco Use   Smoking status: Never   Smokeless tobacco: Never   Tobacco comments:    Quit 40years ago.   Vaping Use    Vaping status: Never Used  Substance Use Topics   Alcohol use: Yes    Comment: "rarely"    Drug use: Never    Review of Systems Per HPI unless specifically indicated above     Objective:    BP 130/88   Pulse 68   Ht 5\' 6"  (1.676 m)   Wt 168 lb (76.2 kg)   BMI 27.12 kg/m   Wt Readings from Last 3 Encounters:  05/14/23 168 lb (76.2 kg)  05/04/23 167 lb 6.4 oz (75.9 kg)  02/24/23 166 lb (75.3 kg)    Physical Exam Vitals and nursing note reviewed.  Constitutional:      General: She is not in acute distress.    Appearance: She is well-developed. She is not diaphoretic.     Comments: Well-appearing, comfortable, cooperative  HENT:     Head: Normocephalic and atraumatic.  Eyes:     General:        Right eye: No discharge.        Left eye: No discharge.     Conjunctiva/sclera: Conjunctivae normal.  Neck:     Thyroid: No thyromegaly.  Cardiovascular:     Rate and Rhythm: Normal rate and regular rhythm.     Heart sounds: Normal heart sounds. No murmur heard. Pulmonary:     Effort: Pulmonary effort is normal. No respiratory distress.     Breath sounds: Normal breath sounds. No wheezing or rales.  Musculoskeletal:        General: Normal range of motion.     Cervical back: Normal range of motion and neck supple.  Lymphadenopathy:     Cervical: No cervical adenopathy.  Skin:    General: Skin is warm and dry.     Findings: No erythema or rash.     Comments: Lower extremity ankle prior surgical scar, has some hyperpigmentation of healed skin scar tissue, appears very normal.  Neurological:     Mental Status: She is alert and oriented to person, place, and time.  Psychiatric:        Behavior: Behavior normal.     Comments: Well groomed, good eye contact, normal speech and thoughts       Results for orders placed or performed in visit on 05/14/23  POCT urinalysis dipstick   Collection Time: 05/14/23  9:40 AM  Result Value Ref Range   Color, UA yellow    Clarity, UA  clear    Glucose, UA Negative Negative   Bilirubin, UA neg    Ketones, UA neg    Spec Grav, UA 1.015 1.010 - 1.025   Blood, UA negative    pH, UA 7.5 5.0 - 8.0   Protein, UA Positive (A) Negative   Urobilinogen, UA 0.2 0.2 or 1.0 E.U./dL   Nitrite, UA  negative    Leukocytes, UA Negative Negative   Appearance none    Odor yes       Assessment & Plan:   Problem List Items Addressed This Visit     Essential hypertension - Primary   Relevant Orders   BASIC METABOLIC PANEL WITH GFR   CBC with Differential/Platelet   Hyperlipidemia   Relevant Orders   Lipid panel   Other Visit Diagnoses       Elevated hemoglobin A1c       Relevant Orders   Hemoglobin A1c     Yeast infection       Relevant Medications   fluconazole (DIFLUCAN) 150 MG tablet     Urinary frequency       Relevant Orders   Urine Culture   POCT urinalysis dipstick (Completed)     Overactive bladder       Relevant Medications   oxybutynin (DITROPAN-XL) 5 MG 24 hr tablet         Possible Diabetes, constellation of symptoms Reports symptoms of increased thirst, frequent urination, dry skin, and numbness/tingling in extremities. -Order blood work for diabetes and cholesterol screening.  Left Arm Pain / Paresthesia Reports frequent episodes of left arm pain and numbness. -Continue Celebrex as needed for pain. We can consider other med options vs referral Ortho/Neuro if not improving  Frequent Urination Possible OAB Reports frequent urination, particularly at night, disrupting sleep. Previous urine cultures have been both positive and negative. -Perform urine dipstick and send out urine culture. -Start D-Mannose supplement for potential bladder inflammation. -Start Ditropan (oxybutynin) 5mg  at bedtime for potential overactive bladder.  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 -Prescribe fluconazole 150mg  daily for 2  weeks.  Hyperpigmentation Post-Surgery Reports darkening of skin at site of previous foot surgery. -Reassure patient that hyperpigmentation is a common response to scar tissue formation and is not indicative of a problem.  Dry Eyes Reports dry, blurry eyes despite use of prescribed eye drops. -Continue current treatment as prescribed by ophthalmologist.  Follow-up -Check blood work results for diabetes and cholesterol screening. -Check urine culture results and adjust treatment as necessary. -Consider referral to urologist if symptoms of overactive bladder do not improve with Ditropan. -Consider referral to infectious disease specialist if fungal infection does not clear with fluconazole treatment.       Orders Placed This Encounter  Procedures   Urine Culture   Hemoglobin A1c   Lipid panel    Has the patient fasted?:   Yes   BASIC METABOLIC PANEL WITH GFR   CBC with Differential/Platelet   POCT urinalysis dipstick    Meds ordered this encounter  Medications   fluconazole (DIFLUCAN) 150 MG tablet    Sig: Take 1 tablet (150 mg total) by mouth daily.    Dispense:  14 tablet    Refill:  0   oxybutynin (DITROPAN-XL) 5 MG 24 hr tablet    Sig: Take 1 tablet (5 mg total) by mouth at bedtime.    Dispense:  30 tablet    Refill:  0    Follow up plan: Return if symptoms worsen or fail to improve.   Saralyn Pilar, DO [facility] 05/14/2023, 9:01 AM

## 2023-05-14 NOTE — Patient Instructions (Addendum)
Thank you for coming to the office today.  Start Oxybutynin XL 5mg  nightly for overactive bladder urgency.  Labs today for Diabetes screening A1c, cholesterol, chemistry, blood count.  Start the Anti Fungal for the yeast infection. We will extend it to the 2 weeks again.  We will check the urine and contact you if we need to treat.  - We sent urine for a culture, we will call you within next few days if we need to change antibiotics - Please drink plenty of fluids, improve hydration over next 1 week  If symptoms worsening, developing nausea / vomiting, worsening back pain, fevers / chills / sweats, then please return for re-evaluation sooner.  D-Mannose is a natural supplement that can actually help bind to urinary bacteria and reduce their effectiveness it can help prevent UTI from forming, and may reduce some symptoms. It likely cannot cure an active UTI but it is worth a try and good to prevent them with. Try 500mg  twice a day at a full dose if you want, or check package instructions for more info  OTC - D-Mannose + Cranberry - supplement   Please schedule a Follow-up Appointment to: Return if symptoms worsen or fail to improve.  If you have any other questions or concerns, please feel free to call the office or send a message through MyChart. You may also schedule an earlier appointment if necessary.  Additionally, you may be receiving a survey about your experience at our office within a few days to 1 week by e-mail or mail. We value your feedback.  Saralyn Pilar, DO [facility], [place]

## 2023-05-14 NOTE — Telephone Encounter (Signed)
New Rx, 90-day supply not appropriate at this time. Requested Prescriptions  Pending Prescriptions Disp Refills   oxybutynin (DITROPAN-XL) 5 MG 24 hr tablet [Pharmacy Med Name: OXYBUTYNIN ER 5MG  TABLETS] 90 tablet     Sig: TAKE 1 TABLET(5 MG) BY MOUTH AT BEDTIME     Urology:  Bladder Agents Passed - 05/14/2023  4:21 PM      Passed - Valid encounter within last 12 months    Recent Outpatient Visits           Today Essential hypertension   [facility] Smitty Cords, DO   2 months ago Toenail fungus   [place] [facility] Smitty Cords, DO   3 months ago Toenail fungus   [place] [facility] [place], [place] W, NP   6 months ago Acute cystitis with hematuria   [place] [facility] Smitty Cords, DO   8 months ago Essential hypertension    [facility] Smitty Cords, DO       Future Appointments             In 2 months Agbor-Etang, Arlys John, MD [facility]

## 2023-05-15 LAB — CBC WITH DIFFERENTIAL/PLATELET
Absolute Lymphocytes: 1734 {cells}/uL (ref 850–3900)
Absolute Monocytes: 377 {cells}/uL (ref 200–950)
Basophils Absolute: 41 {cells}/uL (ref 0–200)
Basophils Relative: 1 %
Eosinophils Absolute: 70 {cells}/uL (ref 15–500)
Eosinophils Relative: 1.7 %
HCT: 40.9 % (ref 35.0–45.0)
Hemoglobin: 13.5 g/dL (ref 11.7–15.5)
MCH: 29.9 pg (ref 27.0–33.0)
MCHC: 33 g/dL (ref 32.0–36.0)
MCV: 90.5 fL (ref 80.0–100.0)
MPV: 10.7 fL (ref 7.5–12.5)
Monocytes Relative: 9.2 %
Neutro Abs: 1878 {cells}/uL (ref 1500–7800)
Neutrophils Relative %: 45.8 %
Platelets: 373 10*3/uL (ref 140–400)
RBC: 4.52 10*6/uL (ref 3.80–5.10)
RDW: 12.1 % (ref 11.0–15.0)
Total Lymphocyte: 42.3 %
WBC: 4.1 10*3/uL (ref 3.8–10.8)

## 2023-05-15 LAB — LIPID PANEL
Cholesterol: 126 mg/dL (ref <200)
HDL: 55 mg/dL (ref >=50)
LDL Cholesterol (Calc): 57 mg/dL
Non-HDL Cholesterol (Calc): 71 mg/dL (ref <130)
Total CHOL/HDL Ratio: 2.3 (calc) (ref <5.0)
Triglycerides: 55 mg/dL (ref <150)

## 2023-05-15 LAB — HEMOGLOBIN A1C
Hgb A1c MFr Bld: 5.5 %{Hb} (ref <5.7)
Mean Plasma Glucose: 111 mg/dL
eAG (mmol/L): 6.2 mmol/L

## 2023-05-15 LAB — URINE CULTURE
MICRO NUMBER:: 15877546
Result:: NO GROWTH
SPECIMEN QUALITY:: ADEQUATE

## 2023-05-15 LAB — BASIC METABOLIC PANEL WITH GFR
BUN: 19 mg/dL (ref 7–25)
CO2: 30 mmol/L (ref 20–32)
Calcium: 10.7 mg/dL — ABNORMAL HIGH (ref 8.6–10.4)
Chloride: 102 mmol/L (ref 98–110)
Creat: 0.99 mg/dL (ref 0.50–1.05)
Glucose, Bld: 95 mg/dL (ref 65–99)
Potassium: 4.1 mmol/L (ref 3.5–5.3)
Sodium: 141 mmol/L (ref 135–146)
eGFR: 63 mL/min/{1.73_m2} (ref >=60)

## 2023-05-21 ENCOUNTER — Other Ambulatory Visit: Payer: Self-pay

## 2023-05-21 ENCOUNTER — Telehealth: Payer: Self-pay | Admitting: Cardiology

## 2023-05-21 MED ORDER — AMLODIPINE BESYLATE 10 MG PO TABS: 10.0000 mg | ORAL_TABLET | Freq: Every day | ORAL | 0 refills | Status: DC

## 2023-05-21 NOTE — Telephone Encounter (Signed)
*  STAT* If patient is at the pharmacy, call can be transferred to refill team.   1. Which medications need to be refilled? (please list name of each medication and dose if known)   amLODipine (NORVASC) 10 MG tablet   2. Would you like to learn more about the convenience, safety, & potential cost savings by using the [facility]?   3. Are you open to using the [facility] (Type [facility]. ).  4. Which pharmacy/location (including street and city if local pharmacy) is medication to be sent to?  [facility] - [place], [place] - [address]   5. Do they need a 30 day or 90 day supply?   90 day  Patient stated she still has some medication.

## 2023-05-21 NOTE — Telephone Encounter (Signed)
Requested Prescriptions   Signed Prescriptions Disp Refills   amLODipine (NORVASC) 10 MG tablet 90 tablet 0    Sig: Take 1 tablet (10 mg total) by mouth daily.    Authorizing Provider: Debbe Odea    Ordering User: Margrett Rud

## 2023-05-21 NOTE — Telephone Encounter (Signed)
Patient reported she had blood work done at her primary care's office and the results for her cholesterol are available for Dr. Azucena Cecil to review.

## 2023-05-25 ENCOUNTER — Other Ambulatory Visit: Payer: Self-pay | Admitting: Family Medicine

## 2023-05-25 DIAGNOSIS — E782 Mixed hyperlipidemia: Secondary | ICD-10-CM

## 2023-05-29 NOTE — Telephone Encounter (Signed)
 Requested Prescriptions  Pending Prescriptions Disp Refills   rosuvastatin  (CRESTOR ) 10 MG tablet [Pharmacy Med Name: Rosuvastatin  Calcium  Oral Tablet 10 MG] 90 tablet 3    Sig: TAKE 1 TABLET AT BEDTIME     Cardiovascular:  Antilipid - Statins 2 Failed - 05/29/2023  9:17 AM      Failed - Lipid Panel in normal range within the last 12 months    Cholesterol  Date Value Ref Range Status  05/14/2023 126 <200 mg/dL Final   LDL Cholesterol (Calc)  Date Value Ref Range Status  05/14/2023 57 mg/dL (calc) Final    Comment:    Reference range: <100 . Desirable range <100 mg/dL for primary prevention;   <70 mg/dL for patients with CHD or diabetic patients  with > or = 2 CHD risk factors. SABRA LDL-C is now calculated using the Martin-Hopkins  calculation, which is a validated novel method providing  better accuracy than the Friedewald equation in the  estimation of LDL-C.  Gladis APPLETHWAITE et al. SANDREA. 7986;689(80): 2061-2068  (http://education.[employer].com/faq/FAQ164)    HDL  Date Value Ref Range Status  05/14/2023 55 > OR = 50 mg/dL Final   Triglycerides  Date Value Ref Range Status  05/14/2023 55 <150 mg/dL Final         Passed - Cr in normal range and within 360 days    Creat  Date Value Ref Range Status  05/14/2023 0.99 0.50 - 1.05 mg/dL Final         Passed - Patient is not pregnant      Passed - Valid encounter within last 12 months    Recent Outpatient Visits           2 weeks ago Essential hypertension   [facility] [place], Marsa PARAS, DO   3 months ago Toenail fungus   [place] [facility] [place] Marsa PARAS, DO   4 months ago Toenail fungus   [place] [facility] [place], [place] W, NP   6 months ago Acute cystitis with hematuria   [place] [facility] [place] Marsa PARAS, DO   8 months ago Essential hypertension   [place] [facility]  [place] Marsa PARAS, DO       Future Appointments             In 2 months Agbor-Etang, Redell, MD [facility] at [facility]

## 2023-06-07 DIAGNOSIS — Z1331 Encounter for screening for depression: Secondary | ICD-10-CM | POA: Diagnosis not present

## 2023-06-07 DIAGNOSIS — L292 Pruritus vulvae: Secondary | ICD-10-CM | POA: Diagnosis not present

## 2023-06-07 DIAGNOSIS — N898 Other specified noninflammatory disorders of vagina: Secondary | ICD-10-CM | POA: Diagnosis not present

## 2023-06-07 DIAGNOSIS — Z01419 Encounter for gynecological examination (general) (routine) without abnormal findings: Secondary | ICD-10-CM | POA: Diagnosis not present

## 2023-06-29 ENCOUNTER — Ambulatory Visit
Admission: RE | Admit: 2023-06-29 | Discharge: 2023-06-29 | Disposition: A | Discharge status: Home / Self Care | Payer: Medicare PPO | Source: Ambulatory Visit | Attending: Obstetrics & Gynecology | Admitting: Obstetrics & Gynecology

## 2023-06-29 ENCOUNTER — Ambulatory Visit: Payer: Medicare PPO

## 2023-06-29 DIAGNOSIS — N644 Mastodynia: Secondary | ICD-10-CM

## 2023-07-20 ENCOUNTER — Other Ambulatory Visit: Payer: Self-pay

## 2023-07-20 DIAGNOSIS — I10 Essential (primary) hypertension: Secondary | ICD-10-CM

## 2023-07-20 MED ORDER — VALSARTAN 160 MG PO TABS: 160.0000 mg | ORAL_TABLET | Freq: Two times a day (BID) | ORAL | 0 refills | Status: DC

## 2023-07-28 ENCOUNTER — Ambulatory Visit: Payer: Self-pay | Admitting: Physician Assistant

## 2023-08-04 ENCOUNTER — Other Ambulatory Visit: Payer: Self-pay | Admitting: Family Medicine

## 2023-08-04 DIAGNOSIS — J9801 Acute bronchospasm: Secondary | ICD-10-CM

## 2023-08-04 NOTE — Telephone Encounter (Signed)
 Requested Prescriptions  Pending Prescriptions Disp Refills   albuterol (VENTOLIN HFA) 108 (90 Base) MCG/ACT inhaler [Pharmacy Med Name: Albuterol Sulfate HFA Inhalation Aerosol Solution 108 (90 Base) MCG/ACT] 18 g 0    Sig: INHALE 2 PUFFS EVERY 4 HOURS AS NEEDED FOR WHEEZING OR SHORTNESS OF BREATH (COUGH)     Pulmonology:  Beta Agonists 2 Passed - 08/04/2023  5:25 PM      Passed - Last BP in normal range    BP Readings from Last 1 Encounters:  05/14/23 130/88         Passed - Last Heart Rate in normal range    Pulse Readings from Last 1 Encounters:  05/14/23 68         Passed - Valid encounter within last 12 months    Recent Outpatient Visits           2 months ago Essential hypertension   [place] [facility] [place], Netta Neat, DO   5 months ago Toenail fungus   [place] [facility] Smitty Cords, DO   6 months ago Toenail fungus   [place] [facility] [place], [place] W, NP   8 months ago Acute cystitis with hematuria   [place] [facility] Smitty Cords, DO   10 months ago Essential hypertension   [place] [facility] Smitty Cords, DO       Future Appointments             In 1 week Agbor-Etang, Arlys John, MD [facility] at [facility]

## 2023-08-05 ENCOUNTER — Other Ambulatory Visit: Payer: Self-pay

## 2023-08-05 MED ORDER — AMLODIPINE BESYLATE 10 MG PO TABS: 10.0000 mg | ORAL_TABLET | Freq: Every day | ORAL | 3 refills | Status: AC

## 2023-08-11 ENCOUNTER — Ambulatory Visit: Payer: Medicare PPO | Admitting: Cardiology

## 2023-09-06 ENCOUNTER — Ambulatory Visit: Payer: Self-pay

## 2023-09-06 NOTE — Telephone Encounter (Signed)
  Chief Complaint: cough Symptoms: cough, green & yellow phlegm, diarrhea Frequency: since friday Pertinent Negatives: Patient denies fever, sob, vomiting, chest pain Disposition: [] ED /[] Urgent Care (no appt availability in office) / [x] Appointment(In office/virtual)/ []  [place] [facility]/ [] Home Care/ [] Refused Recommended Disposition /[] [place] [facility]/ []  Follow-up with PCP Additional Notes: Patient reports she recently got back from a cruise. Patient reports since Friday she has been experiencing cough, green & yellow phlegm, and diarrhea. Patient requesting next available appt. Appt scheduled 09/08/23. Patient advised to call back with worsening symptoms. Patient verbalized understanding.      Copied from CRM [id number]. Topic: Clinical - Red Word Triage >> Sep 06, 2023 10:38 AM Antwanette L wrote: Red Word that prompted transfer to Nurse Triage: Patient is coughing a yellow and green phlegm Reason for Disposition  Cough with cold symptoms (e.g., runny nose, postnasal drip, throat clearing)  Answer Assessment - Initial Assessment Questions 1. ONSET: "When did the cough begin?"      friday 2. SEVERITY: "How bad is the cough today?"      cough 3. SPUTUM: "Describe the color of your sputum" (none, dry cough; clear, white, yellow, green)     Yellow and green 4. HEMOPTYSIS: "Are you coughing up any blood?" If so ask: "How much?" (flecks, streaks, tablespoons, etc.)     none 5. DIFFICULTY BREATHING: "Are you having difficulty breathing?" If Yes, ask: "How bad is it?" (e.g., mild, moderate, severe)    - MILD: No SOB at rest, mild SOB with walking, speaks normally in sentences, can lie down, no retractions, pulse < 100.    - MODERATE: SOB at rest, SOB with minimal exertion and prefers to sit, cannot lie down flat, speaks in phrases, mild retractions, audible wheezing, pulse 100-120.    - SEVERE: Very SOB at rest, speaks in single words, struggling to breathe, sitting hunched  forward, retractions, pulse > 120      denies 6. FEVER: "Do you have a fever?" If Yes, ask: "What is your temperature, how was it measured, and when did it start?"     denies 7. CARDIAC HISTORY: "Do you have any history of heart disease?" (e.g., heart attack, congestive heart failure)      HTN 8. LUNG HISTORY: "Do you have any history of lung disease?"  (e.g., pulmonary embolus, asthma, emphysema)     asthma 9. PE RISK FACTORS: "Do you have a history of blood clots?" (or: recent major surgery, recent prolonged travel, bedridden)     Recent flight and cruise 10. OTHER SYMPTOMS: "Do you have any other symptoms?" (e.g., runny nose, wheezing, chest pain)       Diarrhea, sore throat, congestion 11. PREGNANCY: "Is there any chance you are pregnant?" "When was your last menstrual period?"       N/a 12. TRAVEL: "Have you traveled out of the country in the last month?" (e.g., travel history, exposures)       Yes, recent cruise  Protocols used: Cough - Acute Productive-A-AH

## 2023-09-08 ENCOUNTER — Ambulatory Visit (INDEPENDENT_AMBULATORY_CARE_PROVIDER_SITE_OTHER): Admitting: Family Medicine

## 2023-09-08 ENCOUNTER — Other Ambulatory Visit: Payer: Self-pay | Admitting: Family Medicine

## 2023-09-08 ENCOUNTER — Encounter: Payer: Self-pay | Admitting: Family Medicine

## 2023-09-08 VITALS — BP 150/90 | HR 83 | Ht 66.0 in | Wt 175.0 lb

## 2023-09-08 DIAGNOSIS — J9801 Acute bronchospasm: Secondary | ICD-10-CM | POA: Diagnosis not present

## 2023-09-08 DIAGNOSIS — R051 Acute cough: Secondary | ICD-10-CM | POA: Diagnosis not present

## 2023-09-08 DIAGNOSIS — J40 Bronchitis, not specified as acute or chronic: Secondary | ICD-10-CM | POA: Diagnosis not present

## 2023-09-08 DIAGNOSIS — U071 COVID-19: Secondary | ICD-10-CM

## 2023-09-08 DIAGNOSIS — K219 Gastro-esophageal reflux disease without esophagitis: Secondary | ICD-10-CM

## 2023-09-08 LAB — POC COVID19/FLU A&B COMBO
Covid Antigen, POC: POSITIVE — AB
Influenza A Antigen, POC: NEGATIVE
Influenza B Antigen, POC: NEGATIVE

## 2023-09-08 MED ORDER — PREDNISONE 50 MG PO TABS: 50.0000 mg | ORAL_TABLET | Freq: Every day | ORAL | 0 refills | Status: DC

## 2023-09-08 MED ORDER — FAMOTIDINE 20 MG PO TABS: 20.0000 mg | ORAL_TABLET | Freq: Two times a day (BID) | ORAL | 3 refills | Status: AC

## 2023-09-08 MED ORDER — AZITHROMYCIN 250 MG PO TABS: ORAL_TABLET | ORAL | 0 refills | Status: DC

## 2023-09-08 NOTE — Patient Instructions (Addendum)

## 2023-09-08 NOTE — Progress Notes (Signed)
 Subjective:    Patient ID: Kendra Allison, female    DOB: 1955/12/01, 68 y.o.   MRN: [id number]  Kendra Allison is a 68 y.o. female presenting on 09/08/2023 for Cough  Patient presents for a same day appointment.   HPI  Discussed the use of AI scribe software for clinical note transcription with the patient, who gave verbal consent to proceed.  History of Present Illness   Kendra Allison is a 68 year old female who presents with COVID-19 symptoms.  She tested positive for COVID-19 after returning from a trip to [place] and a seven-day cruise. She was the last in her group of nine to be tested, and others in the group are also sick.  Her symptoms began on Friday with a 'stretchy throat' that progressed to a sore throat after waking up from a nap. She developed a persistent cough, chest tightness, and back pain. Initially, she considered going to the emergency room but was advised by a nurse to manage symptoms at home with pain medication, fluids, and rest.  Currently, she experiences significant nasal congestion, chest tightness, sinus drainage causing nausea, and a headache. She describes feeling 'miserable' and notes that her sinuses are draining into her stomach, leading to a sensation of needing to vomit.  She has been using nasal spray, Albuterol, and Theraflu to manage her symptoms, which have provided some relief. She sees green and yellow mucus when clearing her throat but is unable to blow her nose due to congestion.  She also has diarrhea and has been taking Pepto-Bismol, which has provided some relief. She mentions having medication for nausea as well.         09/08/2023    2:19 PM 09/08/2023    2:18 PM 05/14/2023    8:59 AM  Depression screen PHQ 2/9  Decreased Interest 0 0 0  Down, Depressed, Hopeless 0 0 0  PHQ - 2 Score 0 0 0  Altered sleeping 3  3  Tired, decreased energy 3  3  Change in appetite 0  0  Feeling bad or failure about yourself  0  0  Trouble  concentrating 0  0  Moving slowly or fidgety/restless 0  0  Suicidal thoughts 0  0  PHQ-9 Score 6  6  Difficult doing work/chores Not difficult at all         09/08/2023    2:19 PM 05/14/2023    8:59 AM 02/24/2023    4:09 PM 11/11/2022    3:39 PM  GAD 7 : Generalized Anxiety Score  Nervous, Anxious, on Edge 0 0 0 0  Control/stop worrying 0 0 0 0  Worry too much - different things 0 0 0 0  Trouble relaxing 0 0 0 0  Restless 0 0 0 0  Easily annoyed or irritable 0 0 0 0  Afraid - awful might happen 0 0  0  Total GAD 7 Score 0 0  0  Anxiety Difficulty Not difficult at all       Social History   Tobacco Use   Smoking status: Never   Smokeless tobacco: Never   Tobacco comments:    Quit 40years ago.   Vaping Use   Vaping status: Never Used  Substance Use Topics   Alcohol use: Yes    Comment: "rarely"    Drug use: Never    Review of Systems Per HPI unless specifically indicated above     Objective:    BP (!) 150/90 (BP Location: Left  Arm, Cuff Size: Normal)   Pulse 83   Ht 5\' 6"  (1.676 m)   Wt 175 lb (79.4 kg)   SpO2 100%   BMI 28.25 kg/m   Wt Readings from Last 3 Encounters:  09/08/23 175 lb (79.4 kg)  05/14/23 168 lb (76.2 kg)  05/04/23 167 lb 6.4 oz (75.9 kg)    Physical Exam Vitals and nursing note reviewed.  Constitutional:      General: She is not in acute distress.    Appearance: Normal appearance. She is well-developed. She is not diaphoretic.     Comments: Well-appearing, comfortable, cooperative  HENT:     Head: Normocephalic and atraumatic.  Eyes:     General:        Right eye: No discharge.        Left eye: No discharge.     Conjunctiva/sclera: Conjunctivae normal.  Cardiovascular:     Rate and Rhythm: Normal rate.  Pulmonary:     Effort: Pulmonary effort is normal. No respiratory distress.     Breath sounds: Normal breath sounds. No wheezing.     Comments: Coughing. Skin:    General: Skin is warm and dry.     Findings: No erythema or  rash.  Neurological:     Mental Status: She is alert and oriented to person, place, and time.  Psychiatric:        Mood and Affect: Mood normal.        Behavior: Behavior normal.        Thought Content: Thought content normal.     Comments: Well groomed, good eye contact, normal speech and thoughts     Results for orders placed or performed in visit on 09/08/23  POC Covid19/Flu A&B Antigen   Collection Time: 09/08/23  6:58 PM  Result Value Ref Range   Influenza A Antigen, POC Negative Negative   Influenza B Antigen, POC Negative Negative   Covid Antigen, POC Positive (A) Negative      Assessment & Plan:   Problem List Items Addressed This Visit   None Visit Diagnoses       COVID-19    -  Primary   Relevant Medications   azithromycin (ZITHROMAX Z-PAK) 250 MG tablet   predniSONE (DELTASONE) 50 MG tablet     Bronchitis       Relevant Medications   azithromycin (ZITHROMAX Z-PAK) 250 MG tablet   predniSONE (DELTASONE) 50 MG tablet     Bronchospasm, acute       Relevant Medications   predniSONE (DELTASONE) 50 MG tablet     Acute cough       Relevant Orders   POC Covid19/Flu A&B Antigen (Completed)        COVID-19 infection Confirmed COVID-19 infection on POC test today. (Negative flu). She has mild symptoms and duration >5 days now.   Discussed Current treatment focuses on symptom management. COVID-specific medications anti viral not used due to limited efficacy Day >5 and risk of rebound symptoms. And her symptoms and risk factors are not high risk.  - Continue nasal spray and Albuterol for congestion and cough. - Prescribe five-day course of steroids for cough. - Advise continued use of Theraflu for fever and congestion. - Recommend Imodium or peppermint oil for diarrhea. -offered Zofran but she has anti nausea med already - Advise hydration and rest. - Instruct to wear a mask and avoid public places until symptoms improve and fever resolves. IF not improving -  Prescribe azithromycin for potential secondary bacterial  bronchitis. Given thicker green sputum  General Health Maintenance Discussed timing of COVID-19 vaccinations. Current guidelines suggest waiting for updated vaccine in the fall. - Advise considering COVID-19 vaccination in the fall when the new version is available.        Orders Placed This Encounter  Procedures   POC Covid19/Flu A&B Antigen    Meds ordered this encounter  Medications   azithromycin (ZITHROMAX Z-PAK) 250 MG tablet    Sig: Take 2 tabs (500mg  total) on Day 1. Take 1 tab (250mg ) daily for next 4 days.    Dispense:  6 tablet    Refill:  0   predniSONE (DELTASONE) 50 MG tablet    Sig: Take 1 tablet (50 mg total) by mouth daily with breakfast.    Dispense:  5 tablet    Refill:  0    Follow up plan: Return if symptoms worsen or fail to improve.   Domingo Friend, DO [facility] 09/08/2023, 2:33 PM

## 2023-09-13 ENCOUNTER — Ambulatory Visit: Payer: Self-pay | Admitting: *Deleted

## 2023-09-13 ENCOUNTER — Other Ambulatory Visit: Payer: Self-pay | Admitting: Family Medicine

## 2023-09-13 DIAGNOSIS — J0110 Acute frontal sinusitis, unspecified: Secondary | ICD-10-CM

## 2023-09-13 DIAGNOSIS — H1033 Unspecified acute conjunctivitis, bilateral: Secondary | ICD-10-CM

## 2023-09-13 MED ORDER — IPRATROPIUM BROMIDE 0.06 % NA SOLN: 2.0000 | Freq: Four times a day (QID) | NASAL | 0 refills | Status: DC

## 2023-09-13 MED ORDER — POLYMYXIN B-TRIMETHOPRIM 10000-0.1 UNIT/ML-% OP SOLN: 1.0000 [drp] | Freq: Four times a day (QID) | OPHTHALMIC | 0 refills | Status: DC

## 2023-09-13 NOTE — Telephone Encounter (Signed)
 Patient notified of all instructions. Verbal understanding

## 2023-09-13 NOTE — Addendum Note (Signed)
 Addended by: Raina Bunting on: 09/13/2023 01:01 PM   Modules accepted: Orders

## 2023-09-13 NOTE — Telephone Encounter (Signed)
 Reviewed the triage note.  Unfortunately it is very common for some patients to have lingering upper respiratory and congestion cough symptoms for days to weeks after COVID or similar infection. I don't have a way to cure or solve that easily.  Her eye drainage sounds like she could have developed Conjunctivitis. I will send antibiotic eye drops she can use one in each eye up to 4 times per day for 7-10 days total.  She was already given Azithromycin  Zpak antibiotic, she can finish this and I would suggest mucinex and sudafed OTC to help clear and dry up congestion.  I will also send rx Atrovent  nasal spray for congestion to her pharmacy.  Domingo Friend, DO [facility] 09/13/2023, 1:01 PM

## 2023-09-13 NOTE — Telephone Encounter (Signed)
 Copied from CRM [id number]. Topic: Clinical - Red Word Triage >> Sep 13, 2023 10:52 AM Varney Gentleman wrote: [facility] that prompted transfer to Nurse Triage: Diagnosed with covid finished medicine, but still having issues. When patient blows nose mucus coming through her eyes, throat still scratchy Reason for Disposition  [1] Taking oral antibiotic > 48 hours (2 days) AND [2] pus in eye persists  Answer Assessment - Initial Assessment Questions 1. EYE DISCHARGE: "Is the discharge in one or both eyes?" "What color is it?" "How much is there?" "When did the discharge start?"      When I blow my nose some od the mucus is coming out of my eyes.   I'm clearing my throat a lot.   I was seen last week.   Positive for Covid. I'm much better I went to cruise to [place].    Every one is feeling better from the cruise except me.    2. REDNESS OF SCLERA: "Is the redness in one or both eyes?" "When did the redness start?"      Dry cough, clearing my throat a lot.   I'm still having nasal congestion.   I can blow my nose.    When I blow my nose the mucus is coming out of my eyes.   It's clear from my eyes 3. EYELIDS: "Are the eyelids red or swollen?" If Yes, ask: "How much?"      My eyes are red and itching.    4. VISION: "Is there any difficulty seeing clearly?"      Both eyes.    I feel ok.    Both eyes are red and puffy. 5. PAIN: "Is there any pain? If Yes, ask: "How bad is it?" (Scale 1-10; or mild, moderate, severe)    - MILD (1-3): doesn't interfere with normal activities     - MODERATE (4-7): interferes with normal activities or awakens from sleep    - SEVERE (8-10): excruciating pain, unable to do any normal activities       No burning I took the medication when I came in last week.   I have no energy and can't sleep.  What does he recommend I take?   It was prednisone  and Azithromycin  he gave me.      6. CONTACT LENS: "Do you wear contacts?"     No  7. OTHER SYMPTOMS: "Do you have any other  symptoms?" (e.g., fever, runny nose, cough)     See above 8. PREGNANCY: "Is there any chance you are pregnant?" "When was your last menstrual period?"     N/A due age  Protocols used: Eye - Pus or Discharge-A-AH  Chief Complaint:  Pt seen on 4/16 by Dr. Romeo Co.  Positive for Covid.   Feeling much better after taking the Prednisone  and Z Pak.  She is c/o having a clear mucus coming out of her eyes when she blows her nose.  Her eyes are red and itching with puffiness.  She is also c/o feeling tired and having a dry cough.   She is wondering if there is anything else she needs to be taking or doing to get feeling better.  "I'm much better but still not 100% yet".  I'm having to clear my throat a lot. Frequency: Since being seen 4/16. Pertinent Negatives: Patient denies fever or productive cough.   Disposition: [] ED /[] Urgent Care (no appt availability in office) / [] Appointment(In office/virtual)/ []  [place] [facility]/ [] Home Care/ [] Refused Recommended Disposition /[]   [place] [facility]/ [x]  Follow-up with PCP Additional Notes: Message sent to Dr. Jenean Minus.   Pt. Asking if there is anything else she should be taking or doing especially for the dry cough and mucus from her eyes when she blows her nose.

## 2023-09-22 ENCOUNTER — Ambulatory Visit: Admitting: Allergy

## 2023-10-05 ENCOUNTER — Ambulatory Visit (INDEPENDENT_AMBULATORY_CARE_PROVIDER_SITE_OTHER): Admitting: Family Medicine

## 2023-10-05 ENCOUNTER — Encounter: Payer: Self-pay | Admitting: Family Medicine

## 2023-10-05 VITALS — BP 132/84 | HR 73 | Ht 66.0 in | Wt 171.0 lb

## 2023-10-05 DIAGNOSIS — F5101 Primary insomnia: Secondary | ICD-10-CM

## 2023-10-05 DIAGNOSIS — E559 Vitamin D deficiency, unspecified: Secondary | ICD-10-CM

## 2023-10-05 DIAGNOSIS — M19172 Post-traumatic osteoarthritis, left ankle and foot: Secondary | ICD-10-CM

## 2023-10-05 DIAGNOSIS — Z0000 Encounter for general adult medical examination without abnormal findings: Secondary | ICD-10-CM

## 2023-10-05 DIAGNOSIS — E538 Deficiency of other specified B group vitamins: Secondary | ICD-10-CM

## 2023-10-05 DIAGNOSIS — R7309 Other abnormal glucose: Secondary | ICD-10-CM | POA: Diagnosis not present

## 2023-10-05 DIAGNOSIS — K219 Gastro-esophageal reflux disease without esophagitis: Secondary | ICD-10-CM

## 2023-10-05 DIAGNOSIS — I10 Essential (primary) hypertension: Secondary | ICD-10-CM

## 2023-10-05 DIAGNOSIS — E7800 Pure hypercholesterolemia, unspecified: Secondary | ICD-10-CM | POA: Diagnosis not present

## 2023-10-05 DIAGNOSIS — L658 Other specified nonscarring hair loss: Secondary | ICD-10-CM

## 2023-10-05 MED ORDER — TRAZODONE HCL 50 MG PO TABS: 50.0000 mg | ORAL_TABLET | Freq: Every day | ORAL | 0 refills | Status: DC

## 2023-10-05 MED ORDER — ESOMEPRAZOLE MAGNESIUM 40 MG PO CPDR
40.0000 mg | DELAYED_RELEASE_CAPSULE | Freq: Every day | ORAL | 0 refills | Status: DC
Start: 2023-10-05 — End: 2024-01-11

## 2023-10-05 MED ORDER — CELECOXIB 200 MG PO CAPS: 200.0000 mg | ORAL_CAPSULE | Freq: Two times a day (BID) | ORAL | 3 refills | Status: DC | PRN

## 2023-10-05 NOTE — Progress Notes (Signed)
 Subjective:    Patient ID: Kendra Allison, female    DOB: 02-28-1956, 68 y.o.   MRN: [id number]  Kendra Allison is a 68 y.o. female presenting on 10/05/2023 for Annual Exam   HPI  Discussed the use of AI scribe software for clinical note transcription with the patient, who gave verbal consent to proceed.  History of Present Illness   Kendra Allison is a 68 year old female who presents for an annual physical exam and follow-up after COVID-19 infection.  She has persistent throat clearing and irritation since recovering from COVID-19 in April. Previous treatment with prednisone  and an antibiotic resolved her coughing and chest pain, but throat irritation persists, particularly at night, affecting her sleep. She uses an adjustable bed and has tried warm salt water and throat lozenges without relief. She is currently using Flonase  nasal spray and cetirizine daily. No fever, ongoing cough, or chest pain.  She has experienced hair loss over the past year, particularly on the top of her head. She has tried various oils and biotin supplements without improvement. She wears wigs due to the hair loss, which she finds uncomfortable in warm weather.  She reports weight loss and difficulty sleeping. She takes over-the-counter sleep aids, including melatonin 15 mg nightly, but still struggles with insomnia. She has previously used zolpidem  but did not tolerate it well.  She has a history of acid reflux and uses famotidine . She experiences throat irritation when eating. She has previously used pantoprazole  prescribed by a gastroenterologist.  She is concerned about her thyroid  function, cholesterol, and kidney function, as she has experienced vision changes and was evaluated for glaucoma and cataracts. Her last thyroid  test in April was normal.  She is not currently working and mentions dietary changes to manage her blood pressure, including reducing salt and sweets.     Last visit 09/08/23 - COVID,  since resolved. Now has frequent throat clearing, and some soreness in throat. Question acid reflux and sinus drainage, keeping her awake at night   CHRONIC HTN: On Valsartan  160mg  TWICE A DAY, hydrochlorothiazide  25mg  daily, Amlodipine  10mg  daily Reports good compliance, took meds today. Tolerating well, w/o complaints. Denies CP, dyspnea, HA, edema, dizziness / lightheadedness   Hyperlipidemia On Rosuvastatin , 10mg    Anemia She is taking iron pills / beet juice.  Lab showed CBC normal range without anemia.      10/05/2023    8:59 AM 09/08/2023    2:19 PM 09/08/2023    2:18 PM  Depression screen PHQ 2/9  Decreased Interest 0 0 0  Down, Depressed, Hopeless 0 0 0  PHQ - 2 Score 0 0 0  Altered sleeping 3 3   Tired, decreased energy 3 3   Change in appetite 0 0   Feeling bad or failure about yourself  0 0   Trouble concentrating 0 0   Moving slowly or fidgety/restless 0 0   Suicidal thoughts 0 0   PHQ-9 Score 6 6   Difficult doing work/chores Not difficult at all Not difficult at all        10/05/2023    8:59 AM 09/08/2023    2:19 PM 05/14/2023    8:59 AM 02/24/2023    4:09 PM  GAD 7 : Generalized Anxiety Score  Nervous, Anxious, on Edge 0 0 0 0  Control/stop worrying 0 0 0 0  Worry too much - different things 0 0 0 0  Trouble relaxing 0 0 0 0  Restless 0 0 0 0  Easily annoyed or irritable 0 0 0 0  Afraid - awful might happen 0 0 0   Total GAD 7 Score 0 0 0   Anxiety Difficulty Not difficult at all Not difficult at all       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   Osteoporosis    Sebaceous cyst 06/13/2021   Past Surgical History:  Procedure Laterality Date   ABDOMINAL HYSTERECTOMY     BREAST BIOPSY     COLONOSCOPY WITH PROPOFOL  N/A 08/10/2019   Procedure: COLONOSCOPY WITH PROPOFOL ;   Surgeon: Luke Salaam, MD;  Location[facility] ENDOSCOPY;  Service: Gastroenterology;  Laterality: N/A;   ESOPHAGOGASTRODUODENOSCOPY (EGD) WITH PROPOFOL  N/A 08/10/2019   Procedure: ESOPHAGOGASTRODUODENOSCOPY (EGD) WITH PROPOFOL ;  Surgeon: Luke Salaam, MD;  Location: [facility] ENDOSCOPY;  Service: Gastroenterology;  Laterality: N/A;   EXTERNAL FIXATION LEG Left 04/07/2020    EXTERNAL FIXATION LEG (Left Leg Lower)   EXTERNAL FIXATION LEG Left 04/07/2020   Procedure: EXTERNAL FIXATION LEG;  Surgeon: Laneta Pintos, MD;  Location: [facility] OR;  Service: Orthopedics;  Laterality: Left;   EXTERNAL FIXATION REMOVAL Left 04/10/2020   Procedure: REMOVAL EXTERNAL FIXATION LEG;  Surgeon: Laneta Pintos, MD;  Location: [facility] OR;  Service: Orthopedics;  Laterality: Left;   I & D EXTREMITY Left 04/07/2020   Procedure: IRRIGATION AND DEBRIDEMENT EXTREMITY;  Surgeon: Laneta Pintos, MD;  Location: [facility] OR;  Service: Orthopedics;  Laterality: Left;   I & D EXTREMITY Left 06/12/2020   Procedure: IRRIGATION AND DEBRIDEMENT EXTREMITY;  Surgeon: Laneta Pintos, MD;  Location: [facility] OR;  Service: Orthopedics;  Laterality: Left;   Nissem  2014   TIBIA IM NAIL INSERTION Left 04/10/2020   Procedure: INTRAMEDULLARY (IM) NAIL TIBIAL;  Surgeon: Laneta Pintos, MD;  Location: [facility] OR;  Service: Orthopedics;  Laterality: Left;   Social History   Socioeconomic History   Marital status: Single    Spouse name: Not on file   Number of children: 0   Years of education: Not on file   Highest education level: Some college, no degree  Occupational History   Occupation: Retired  Tobacco Use   Smoking status: Never   Smokeless tobacco: Never   Tobacco comments:    Quit 40years ago.   Vaping Use   Vaping status: Never Used  Substance and Sexual Activity   Alcohol use: Yes    Comment: "rarely"    Drug use: Never   Sexual activity: Not Currently  Other Topics Concern   Not on file  Social History Narrative   ** Merged History Encounter **        Social Drivers of Health   Financial Resource Strain: Low Risk  (05/10/2023)   Overall Financial Resource Strain (CARDIA)    Difficulty of Paying Living Expenses: Not hard at all  Food Insecurity: No Food Insecurity (05/10/2023)   Hunger Vital Sign    Worried About Running Out of Food in the Last Year: Never true    Ran Out of Food in the Last Year: Never true  Transportation Needs: No Transportation Needs (05/10/2023)   PRAPARE - Administrator, Civil Service (Medical): No    Lack of Transportation (Non-Medical): No  Physical Activity: Inactive (05/10/2023)   Exercise Vital Sign    Days of Exercise per Week:  0 days    Minutes of Exercise per Session: 60 min  Stress: No Stress Concern Present (05/10/2023)   [employer] of Occupational Health - Occupational Stress Questionnaire    Feeling of Stress : Not at all  Social Connections: Unknown (05/10/2023)   Social Connection and Isolation Panel [NHANES]    Frequency of Communication with Friends and Family: Twice a week    Frequency of Social Gatherings with Friends and Family: Patient declined    Attends Religious Services: More than 4 times per year    Active Member of [employer] or Organizations: No    Attends Engineer, structural: Not on file    Marital Status: Never married  Intimate Partner Violence: Not At Risk (09/10/2022)   Humiliation, Afraid, Rape, and Kick questionnaire    Fear of Current or Ex-Partner: No    Emotionally Abused: No    Physically Abused: No    Sexually Abused: No   History reviewed. No pertinent family history. Current Outpatient Medications on File Prior to Visit  Medication Sig   albuterol  (VENTOLIN  HFA) 108 (90 Base) MCG/ACT inhaler INHALE 2 PUFFS EVERY 4 HOURS AS NEEDED FOR WHEEZING OR SHORTNESS OF BREATH (COUGH)   amLODipine  (NORVASC ) 10 MG tablet Take 1 tablet (10 mg total) by mouth daily.   Calcium  Carb-Cholecalciferol (CALCIUM  + VITAMIN D3 PO) Take 1 tablet by mouth  daily.   famotidine  (PEPCID ) 20 MG tablet Take 1 tablet (20 mg total) by mouth 2 (two) times daily.   fluticasone  (FLONASE ) 50 MCG/ACT nasal spray Place 2 sprays into both nostrils daily as needed for allergies.   fluticasone -salmeterol (ADVAIR HFA) 45-21 MCG/ACT inhaler Inhale 2 puffs into the lungs 2 (two) times daily.   hydrochlorothiazide  (HYDRODIURIL ) 25 MG tablet Take 1 tablet (25 mg total) by mouth daily.   ipratropium (ATROVENT ) 0.06 % nasal spray Place 2 sprays into both nostrils 4 (four) times daily. For up to 5-7 days then stop.   Multiple Vitamin (MULTIVITAMIN WITH MINERALS) TABS tablet Take 1 tablet by mouth daily. One-A-Day Multivitamin   rosuvastatin  (CRESTOR ) 10 MG tablet TAKE 1 TABLET AT BEDTIME   trimethoprim -polymyxin b  (POLYTRIM ) ophthalmic solution Place 1 drop into both eyes 4 (four) times daily. For up to 7-10 days or until resolved   valsartan  (DIOVAN ) 160 MG tablet Take 1 tablet (160 mg total) by mouth 2 (two) times daily.   [DISCONTINUED] enoxaparin  (LOVENOX ) 40 MG/0.4ML injection Inject 0.4 mLs (40 mg total) into the skin daily for 28 days. (Patient not taking: Reported on 06/11/2020)   [DISCONTINUED] gabapentin  (NEURONTIN ) 100 MG capsule Take 1 capsule (100 mg total) by mouth 3 (three) times daily. (Patient not taking: Reported on 06/11/2020)   No current facility-administered medications on file prior to visit.    Review of Systems  Constitutional:  Negative for activity change, appetite change, chills, diaphoresis, fatigue and fever.  HENT:  Negative for congestion and hearing loss.   Eyes:  Negative for visual disturbance.  Respiratory:  Negative for cough, chest tightness, shortness of breath and wheezing.   Cardiovascular:  Negative for chest pain, palpitations and leg swelling.  Gastrointestinal:  Negative for abdominal pain, constipation, diarrhea, nausea and vomiting.  Genitourinary:  Negative for dysuria, frequency and hematuria.  Musculoskeletal:  Negative  for arthralgias and neck pain.  Skin:  Negative for rash.       Hair loss  Neurological:  Negative for dizziness, weakness, light-headedness, numbness and headaches.  Hematological:  Negative for adenopathy.  Psychiatric/Behavioral:  Negative  for behavioral problems, dysphoric mood and sleep disturbance.    Per HPI unless specifically indicated above     Objective:     BP 132/84 (BP Location: Right Arm, Patient Position: Sitting, Cuff Size: Normal)   Pulse 73   Ht 5\' 6"  (1.676 m)   Wt 171 lb (77.6 kg)   SpO2 98%   BMI 27.60 kg/m   Wt Readings from Last 3 Encounters:  10/05/23 171 lb (77.6 kg)  09/08/23 175 lb (79.4 kg)  05/14/23 168 lb (76.2 kg)    Physical Exam Vitals and nursing note reviewed.  Constitutional:      General: She is not in acute distress.    Appearance: She is well-developed. She is not diaphoretic.     Comments: Well-appearing, comfortable, cooperative  HENT:     Head: Normocephalic and atraumatic.  Eyes:     General:        Right eye: No discharge.        Left eye: No discharge.     Conjunctiva/sclera: Conjunctivae normal.     Pupils: Pupils are equal, round, and reactive to light.  Neck:     Thyroid : No thyromegaly.  Cardiovascular:     Rate and Rhythm: Normal rate and regular rhythm.     Pulses: Normal pulses.     Heart sounds: Normal heart sounds. No murmur heard. Pulmonary:     Effort: Pulmonary effort is normal. No respiratory distress.     Breath sounds: Normal breath sounds. No wheezing or rales.  Abdominal:     General: Bowel sounds are normal. There is no distension.     Palpations: Abdomen is soft. There is no mass.     Tenderness: There is no abdominal tenderness.  Musculoskeletal:        General: No tenderness. Normal range of motion.     Cervical back: Normal range of motion and neck supple.     Comments: Upper / Lower Extremities: - Normal muscle tone, strength bilateral upper extremities 5/5, lower extremities 5/5   Lymphadenopathy:     Cervical: No cervical adenopathy.  Skin:    General: Skin is warm and dry.     Findings: No erythema or rash.     Comments: Wearing wig today but has central hair loss thinning  Neurological:     Mental Status: She is alert and oriented to person, place, and time.     Comments: Distal sensation intact to light touch all extremities  Psychiatric:        Mood and Affect: Mood normal.        Behavior: Behavior normal.        Thought Content: Thought content normal.     Comments: Well groomed, good eye contact, normal speech and thoughts     Results for orders placed or performed in visit on 09/08/23  POC Covid19/Flu A&B Antigen   Collection Time: 09/08/23  6:58 PM  Result Value Ref Range   Influenza A Antigen, POC Negative Negative   Influenza B Antigen, POC Negative Negative   Covid Antigen, POC Positive (A) Negative      Assessment & Plan:   Problem List Items Addressed This Visit     Essential hypertension   Relevant Orders   CBC with Differential/Platelet   Comprehensive metabolic panel with GFR   Female pattern hair loss   GERD (gastroesophageal reflux disease)   Relevant Medications   esomeprazole (NEXIUM) 40 MG capsule   Hyperlipidemia   Relevant Orders  TSH   Lipid panel   Comprehensive metabolic panel with GFR   T4, free   Other Visit Diagnoses       Annual physical exam    -  Primary   Relevant Orders   TSH   Lipid panel   Hemoglobin A1c   CBC with Differential/Platelet     Elevated hemoglobin A1c       Relevant Orders   Hemoglobin A1c     Vitamin B12 deficiency       Relevant Orders   Vitamin B12     Vitamin D  deficiency       Relevant Orders   VITAMIN D  25 Hydroxy (Vit-D Deficiency, Fractures)     Post-traumatic arthritis of left ankle       Relevant Medications   celecoxib  (CELEBREX ) 200 MG capsule     Primary insomnia       Relevant Medications   traZODone (DESYREL) 50 MG tablet        Updated Health  Maintenance information Reviewed recent lab results with patient Encouraged improvement to lifestyle with diet and exercise Goal of weight loss  Throat irritation post-COVID Persistent throat irritation likely due to residual post-COVID effects or GERD. Symptoms include throat clearing and irritation postprandially. - Continue Flonase  nasal spray, 2 sprays each side daily. - Continue cetirizine daily. - Consider adding Sudafed if needed. - Add generic Nexium (esomeprazole) for GERD symptoms. - Continue famotidine  as needed.  Gastroesophageal reflux disease (GERD) GERD symptoms include throat irritation and postprandial discomfort. Esomeprazole preferred over omeprazole  due to preference and coverage. - Add generic Nexium (esomeprazole) to current regimen. - Continue famotidine  as needed.  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 Persistent over a year, suggesting genetic factors. Topical minoxidil recommended. - Retry Rogaine (minoxidil) topical treatment for at least 6 months. Ideally up to 12+ months. Explained goal to continue use to avoid hair loss again - Consider referral to dermatologist if no improvement. - Check vitamin D  and B12 levels as part of lab work.  Insomnia Difficulty sleeping despite OTC aids. Zolpidem  not tolerated. Trazodone preferred due to coverage and effectiveness, mirtazapine considered for appetite stimulation. - Prescribe trazodone 50mg  for sleep, 1 tablet at bedtime. - Consider mirtazapine if appetite stimulation is desired.  Hypertension Blood pressure well-controlled with dietary modifications. - Continue current management and lifestyle modifications.  General Health Maintenance Annual wellness visit includes routine blood work to monitor various health parameters. - Order comprehensive blood work including thyroid  panel, cholesterol panel, blood count, chemistry panel, and vitamin B12 and  D levels.         Orders Placed This Encounter  Procedures   TSH   Lipid panel    Has the patient fasted?:   Yes   Hemoglobin A1c   CBC with Differential/Platelet   Comprehensive metabolic panel with GFR    Has the patient fasted?:   Yes   T4, free   Vitamin B12   VITAMIN D  25 Hydroxy (Vit-D Deficiency, Fractures)    Meds ordered this encounter  Medications   esomeprazole (NEXIUM) 40 MG capsule    Sig: Take 1 capsule (40 mg total) by mouth daily before breakfast.    Dispense:  90 capsule    Refill:  0   celecoxib  (CELEBREX ) 200 MG capsule    Sig: Take 1 capsule (200 mg total) by mouth 2 (two) times daily as needed.    Dispense:  60 capsule  Refill:  3   traZODone (DESYREL) 50 MG tablet    Sig: Take 1 tablet (50 mg total) by mouth at bedtime.    Dispense:  90 tablet    Refill:  0     Follow up plan: Return if symptoms worsen or fail to improve.  Domingo Friend, DO [facility] 10/05/2023, 8:33 AM

## 2023-10-05 NOTE — Patient Instructions (Addendum)
 Thank you for coming to the office today.  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 Then if need can discuss with Dermatologist.  Trial on Trazodone 50mg  nightly for sleep. Can double dose if need  For throat drainage, keep on allergy pill Cetirizine and Flonase  2 sprays each nostril daily for now.  Maybe stomach acid is causing the throat issue  Add generic Nexium 40mg  daily before breakfast, continue Famotidine  as well.  BP is controlled  Please schedule a Follow-up Appointment to: Return if symptoms worsen or fail to improve.  If you have any other questions or concerns, please feel free to call the office or send a message through MyChart. You may also schedule an earlier appointment if necessary.  Additionally, you may be receiving a survey about your experience at our office within a few days to 1 week by e-mail or mail. We value your feedback.  Domingo Friend, DO [facility], [place]

## 2023-10-06 ENCOUNTER — Other Ambulatory Visit (HOSPITAL_COMMUNITY): Payer: Self-pay

## 2023-10-06 ENCOUNTER — Telehealth: Payer: Self-pay | Admitting: Cardiology

## 2023-10-06 DIAGNOSIS — I10 Essential (primary) hypertension: Secondary | ICD-10-CM

## 2023-10-06 LAB — LIPID PANEL
Cholesterol: 127 mg/dL (ref <200)
HDL: 56 mg/dL (ref >=50)
LDL Cholesterol (Calc): 55 mg/dL
Non-HDL Cholesterol (Calc): 71 mg/dL (ref <130)
Total CHOL/HDL Ratio: 2.3 (calc) (ref <5.0)
Triglycerides: 84 mg/dL (ref <150)

## 2023-10-06 LAB — CBC WITH DIFFERENTIAL/PLATELET
Absolute Lymphocytes: 1502 {cells}/uL (ref 850–3900)
Absolute Monocytes: 351 {cells}/uL (ref 200–950)
Basophils Absolute: 39 {cells}/uL (ref 0–200)
Basophils Relative: 1 %
Eosinophils Absolute: 101 {cells}/uL (ref 15–500)
Eosinophils Relative: 2.6 %
HCT: 40.8 % (ref 35.0–45.0)
Hemoglobin: 13.3 g/dL (ref 11.7–15.5)
MCH: 29.5 pg (ref 27.0–33.0)
MCHC: 32.6 g/dL (ref 32.0–36.0)
MCV: 90.5 fL (ref 80.0–100.0)
MPV: 10 fL (ref 7.5–12.5)
Monocytes Relative: 9 %
Neutro Abs: 1907 {cells}/uL (ref 1500–7800)
Neutrophils Relative %: 48.9 %
Platelets: 334 10*3/uL (ref 140–400)
RBC: 4.51 10*6/uL (ref 3.80–5.10)
RDW: 12.5 % (ref 11.0–15.0)
Total Lymphocyte: 38.5 %
WBC: 3.9 10*3/uL (ref 3.8–10.8)

## 2023-10-06 LAB — COMPREHENSIVE METABOLIC PANEL WITH GFR
AG Ratio: 2 (calc) (ref 1.0–2.5)
ALT: 15 U/L (ref 6–29)
AST: 20 U/L (ref 10–35)
Albumin: 4.8 g/dL (ref 3.6–5.1)
Alkaline phosphatase (APISO): 63 U/L (ref 37–153)
BUN: 16 mg/dL (ref 7–25)
CO2: 30 mmol/L (ref 20–32)
Calcium: 10.1 mg/dL (ref 8.6–10.4)
Chloride: 103 mmol/L (ref 98–110)
Creat: 0.99 mg/dL (ref 0.50–1.05)
Globulin: 2.4 g/dL (ref 1.9–3.7)
Glucose, Bld: 93 mg/dL (ref 65–99)
Potassium: 4 mmol/L (ref 3.5–5.3)
Sodium: 141 mmol/L (ref 135–146)
Total Bilirubin: 0.5 mg/dL (ref 0.2–1.2)
Total Protein: 7.2 g/dL (ref 6.1–8.1)
eGFR: 62 mL/min/{1.73_m2} (ref >=60)

## 2023-10-06 LAB — HEMOGLOBIN A1C
Hgb A1c MFr Bld: 5.5 % (ref <5.7)
Mean Plasma Glucose: 111 mg/dL
eAG (mmol/L): 6.2 mmol/L

## 2023-10-06 LAB — T4, FREE: Free T4: 1.1 ng/dL (ref 0.8–1.8)

## 2023-10-06 LAB — TSH: TSH: 1.51 m[IU]/L (ref 0.40–4.50)

## 2023-10-06 LAB — VITAMIN B12: Vitamin B-12: 1566 pg/mL — ABNORMAL HIGH (ref 200–1100)

## 2023-10-06 LAB — VITAMIN D 25 HYDROXY (VIT D DEFICIENCY, FRACTURES): Vit D, 25-Hydroxy: 47 ng/mL (ref 30–100)

## 2023-10-06 MED ORDER — VALSARTAN 160 MG PO TABS
160.0000 mg | ORAL_TABLET | Freq: Two times a day (BID) | ORAL | 0 refills | Status: DC
  Filled 2023-10-06: qty 60, 30d supply, fill #0

## 2023-10-06 NOTE — Telephone Encounter (Signed)
*  STAT* If patient is at the pharmacy, call can be transferred to refill team.   1. Which medications need to be refilled? (please list name of each medication and dose if known) valsartan  (DIOVAN ) 160 MG tablet    2. Would you like to learn more about the convenience, safety, & potential cost savings by using the [facility]?     3. Are you open to using the [facility] (Type [facility].  ).   4. Which pharmacy/location (including street and city if local pharmacy) is medication to be sent to? [facility] - [place], [place] - [address]    5. Do they need a 30 day or 90 day supply?

## 2023-10-07 ENCOUNTER — Telehealth: Payer: Self-pay | Admitting: Family Medicine

## 2023-10-07 ENCOUNTER — Ambulatory Visit: Payer: Self-pay | Admitting: Family Medicine

## 2023-10-07 NOTE — Telephone Encounter (Signed)
 LVM 10/07/2023 to schedule AWV. Please schedule Virtual or Telehealth visits ONLY.   Rosalee Collins; Care Guide Ambulatory Clinical Support [facility] Direct Dial: [phone]

## 2023-10-11 NOTE — Telephone Encounter (Signed)
*  STAT* If patient is at the pharmacy, call can be transferred to refill team.   1. Which medications need to be refilled? (please list name of each medication and dose if known)   valsartan  (DIOVAN ) 160 MG tablet    2. Which pharmacy/location (including street and city if local pharmacy) is medication to be sent to?  [facility] - [place], [place] - [address]      3. Do they need a 30 day or 90 day supply? 90 day    Pt is out of medication and has office visit scheduled.

## 2023-10-12 ENCOUNTER — Telehealth: Payer: Self-pay

## 2023-10-12 ENCOUNTER — Other Ambulatory Visit (HOSPITAL_COMMUNITY): Payer: Self-pay

## 2023-10-12 DIAGNOSIS — I10 Essential (primary) hypertension: Secondary | ICD-10-CM

## 2023-10-12 MED ORDER — VALSARTAN 160 MG PO TABS: 160.0000 mg | ORAL_TABLET | Freq: Two times a day (BID) | ORAL | 0 refills | Status: AC

## 2023-10-12 NOTE — Telephone Encounter (Addendum)
 Patient is upset that she has not gotten a Rx refill on her blood pressure medication. The patient was instructed that she had not followed up from 04/2023 appointment & she needed to schedule a follow up in order to get refills. The patient scheduled a follow up. The patients medication was however sent to [facility] pharmacy by accident. The patient has decided to decline her follow up visit at this time with Dr. Junnie Olives as she stated, "I've never had medications filled with [facility]" and would like to change her care as she feels that this is a discrepancy and it's unclear as to why her medication was sent to the [facility] pharmacy and not [facility]. The patient is concerned she will not have the medication in time for her trip and will cancel her appointment and get filled with another physician. I offered to fill the medication to a local pharmacy for the patient however, she declined and said, "not to worry about it" I will find another physician.  I did send a refill for 90 days to her [facility] until she can find another physician.

## 2023-10-27 ENCOUNTER — Ambulatory Visit: Admitting: Cardiology

## 2023-12-30 DIAGNOSIS — L6681 Central centrifugal cicatricial alopecia: Secondary | ICD-10-CM | POA: Diagnosis not present

## 2023-12-30 DIAGNOSIS — B351 Tinea unguium: Secondary | ICD-10-CM | POA: Diagnosis not present

## 2023-12-31 ENCOUNTER — Ambulatory Visit

## 2023-12-31 DIAGNOSIS — Z0000 Encounter for general adult medical examination without abnormal findings: Secondary | ICD-10-CM | POA: Diagnosis not present

## 2023-12-31 NOTE — Progress Notes (Signed)
 Subjective:   Kendra Allison is a 68 y.o. who presents for a Medicare Wellness preventive visit.  As a reminder, Annual Wellness Visits don't include a physical exam, and some assessments may be limited, especially if this visit is performed virtually. We may recommend an in-person follow-up visit with your provider if needed.  Visit Complete: Virtual I connected with  Kendra Allison on 12/31/23 by a audio enabled telemedicine application and verified that I am speaking with the correct person using two identifiers.  Patient Location: Home  Provider Location: Home Office  I discussed the limitations of evaluation and management by telemedicine. The patient expressed understanding and agreed to proceed.  Vital Signs: Because this visit was a virtual/telehealth visit, some criteria may be missing or patient reported. Any vitals not documented were not able to be obtained and vitals that have been documented are patient reported.  VideoDeclined- This patient declined Librarian, academic. Therefore the visit was completed with audio only.  Persons Participating in Visit: Patient.  AWV Questionnaire: No: Patient Medicare AWV questionnaire was not completed prior to this visit.  Cardiac Risk Factors include: advanced age (>32men, >3 women);hypertension;dyslipidemia     Objective:    There were no vitals filed for this visit. There is no height or weight on file to calculate BMI.     12/31/2023    9:39 AM 09/10/2022    2:10 PM 07/09/2020    9:19 AM 06/13/2020   10:31 AM 04/10/2020    9:30 AM 04/07/2020   10:30 PM 08/10/2019    7:16 AM  Advanced Directives  Does Patient Have a Medical Advance Directive? No No No No No No No  Would patient like information on creating a medical advance directive? No - Patient declined  No - Patient declined No - Patient declined No - Patient declined No - Patient declined     Current Medications (verified) Outpatient  Encounter Medications as of 12/31/2023  Medication Sig   albuterol  (VENTOLIN  HFA) 108 (90 Base) MCG/ACT inhaler INHALE 2 PUFFS EVERY 4 HOURS AS NEEDED FOR WHEEZING OR SHORTNESS OF BREATH (COUGH)   amLODipine  (NORVASC ) 10 MG tablet Take 1 tablet (10 mg total) by mouth daily.   Calcium  Carb-Cholecalciferol (CALCIUM  + VITAMIN D3 PO) Take 1 tablet by mouth daily.   celecoxib  (CELEBREX ) 200 MG capsule Take 1 capsule (200 mg total) by mouth 2 (two) times daily as needed.   esomeprazole  (NEXIUM ) 40 MG capsule Take 1 capsule (40 mg total) by mouth daily before breakfast.   famotidine  (PEPCID ) 20 MG tablet Take 1 tablet (20 mg total) by mouth 2 (two) times daily.   fluticasone  (FLONASE ) 50 MCG/ACT nasal spray Place 2 sprays into both nostrils daily as needed for allergies.   fluticasone -salmeterol (ADVAIR HFA) 45-21 MCG/ACT inhaler Inhale 2 puffs into the lungs 2 (two) times daily.   hydrochlorothiazide  (HYDRODIURIL ) 25 MG tablet Take 1 tablet (25 mg total) by mouth daily.   Multiple Vitamin (MULTIVITAMIN WITH MINERALS) TABS tablet Take 1 tablet by mouth daily. One-A-Day Multivitamin   pravastatin (PRAVACHOL) 20 MG tablet Take 20 mg by mouth daily.   valsartan  (DIOVAN ) 160 MG tablet Take 1 tablet (160 mg total) by mouth 2 (two) times daily. **call office for follow up appt**   ipratropium (ATROVENT ) 0.06 % nasal spray Place 2 sprays into both nostrils 4 (four) times daily. For up to 5-7 days then stop.   rosuvastatin  (CRESTOR ) 10 MG tablet TAKE 1 TABLET AT BEDTIME   traZODone  (  DESYREL ) 50 MG tablet Take 1 tablet (50 mg total) by mouth at bedtime. (Patient not taking: Reported on 12/31/2023)   trimethoprim -polymyxin b  (POLYTRIM ) ophthalmic solution Place 1 drop into both eyes 4 (four) times daily. For up to 7-10 days or until resolved (Patient not taking: Reported on 12/31/2023)   [DISCONTINUED] enoxaparin  (LOVENOX ) 40 MG/0.4ML injection Inject 0.4 mLs (40 mg total) into the skin daily for 28 days. (Patient not  taking: Reported on 06/11/2020)   [DISCONTINUED] gabapentin  (NEURONTIN ) 100 MG capsule Take 1 capsule (100 mg total) by mouth 3 (three) times daily. (Patient not taking: Reported on 06/11/2020)   No facility-administered encounter medications on file as of 12/31/2023.    Allergies (verified) Patient has no known allergies.   History: 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   Osteoporosis    Sebaceous cyst 06/13/2021   Past Surgical History:  Procedure Laterality Date   ABDOMINAL HYSTERECTOMY     BREAST BIOPSY     COLONOSCOPY WITH PROPOFOL  N/A 08/10/2019   Procedure: COLONOSCOPY WITH PROPOFOL ;  Surgeon: Therisa Bi, MD;  [facility] ENDOSCOPY;  Service: Gastroenterology;  Laterality: N/A;   ESOPHAGOGASTRODUODENOSCOPY (EGD) WITH PROPOFOL  N/A 08/10/2019   Procedure: ESOPHAGOGASTRODUODENOSCOPY (EGD) WITH PROPOFOL ;  Surgeon: Therisa Bi, MD;  [facility] END[facility]COPY;  Service: Gastroenterology;  Laterality: N/A;   EXTERNAL FIXATION LEG Left 04/07/2020    EXTERNAL FIXATION LEG (Left Leg Lower)   EXTERNAL FIXATION LEG Left 04/07/2020   Procedure: EXTERNAL FIXATION LEG;  Surgeon: Kendal Franky SQUIBB, MD;  Location: [facility] OR;  Service: Orthopedics;  Laterality: Left;   EXTERNAL FIXATION REMOVAL Left 04/10/2020   Procedure: REMOVAL EXTERNAL FIXATION LEG;  Surgeon: Kendal Franky SQUIBB, MD;  Location: [facility] OR;  Service: Orthopedics;  Laterality: Left;   I & D EXTREMITY Left 04/07/2020   Procedure: IRRIGATION AND DEBRIDEMENT EXTREMITY;  Surgeon: Kendal Franky SQUIBB, MD;  Location: [facility] OR;  Service: Orthopedics;  Laterality: Left;   I & D EXTREMITY Left 06/12/2020   Procedure: IRRIGATION AND DEBRIDEMENT EXTREMITY;  Surgeon: Kendal Franky SQUIBB, MD;  Location: [facility] OR;  Service: Orthopedics;  Laterality: Left;   Nissem  2014   TIBIA IM NAIL INSERTION  Left 04/10/2020   Procedure: INTRAMEDULLARY (IM) NAIL TIBIAL;  Surgeon: Kendal Franky SQUIBB, MD;  Location: [facility] OR;  Service: Orthopedics;  Laterality: Left;   History reviewed. No pertinent family history. Social History   Socioeconomic History   Marital status: Single    Spouse name: Not on file   Number of children: 0   Years of education: Not on file   Highest education level: Associate degree: occupational, Scientist, product/process development, or vocational program  Occupational History   Occupation: Retired  Tobacco Use   Smoking status: Never   Smokeless tobacco: Never   Tobacco comments:    Quit 40years ago.   Vaping Use   Vaping status: Never Used  Substance and Sexual Activity   Alcohol use: Yes    Comment: rarely    Drug use: Never   Sexual activity: Not Currently  Other Topics Concern   Not on file  Social History Narrative   ** Merged History Encounter **       Social Drivers of Health   Financial Resource Strain: Low Risk  (12/31/2023)   Overall Financial Resource Strain (CARDIA)    Difficulty of  Paying Living Expenses: Not hard at all  Food Insecurity: No Food Insecurity (12/31/2023)   Hunger Vital Sign    Worried About Running Out of Food in the Last Year: Never true    Ran Out of Food in the Last Year: Never true  Transportation Needs: No Transportation Needs (12/31/2023)   PRAPARE - Administrator, Civil Service (Medical): No    Lack of Transportation (Non-Medical): No  Physical Activity: Sufficiently Active (12/31/2023)   Exercise Vital Sign    Days of Exercise per Week: 4 days    Minutes of Exercise per Session: 60 min  Stress: No Stress Concern Present (12/31/2023)   [employer] of Occupational Health - Occupational Stress Questionnaire    Feeling of Stress: Not at all  Social Connections: Moderately Integrated (12/31/2023)   Social Connection and Isolation Panel    Frequency of Communication with Friends and Family: More than three times a week    Frequency of  Social Gatherings with Friends and Family: Once a week    Attends Religious Services: More than 4 times per year    Active Member of [employer] or Organizations: Yes    Attends Engineer, structural: More than 4 times per year    Marital Status: Never married    Tobacco Counseling Counseling given: Not Answered Tobacco comments: Quit 40years ago.     Clinical Intake:  Pre-visit preparation completed: Yes  Pain : No/denies pain     BMI - recorded: 27.6 Nutritional Status: BMI 25 -29 Overweight Nutritional Risks: None Diabetes: No  Lab Results  Component Value Date   HGBA1C 5.5 10/05/2023   HGBA1C 5.5 05/14/2023   HGBA1C 5.6 09/11/2022     How often do you need to have someone help you when you read instructions, pamphlets, or other written materials from your doctor or pharmacy?: 1 - Never  Interpreter Needed?: No  Information entered by :: JHONNIE DAS, LPN   Activities of Daily Living    12/31/2023    9:42 AM 12/26/2023    4:49 PM  In your present state of health, do you have any difficulty performing the following activities:  Hearing? 0 0  Vision? 0 0  Difficulty concentrating or making decisions? 0 0  Walking or climbing stairs? 0   Dressing or bathing? 0 0  Doing errands, shopping? 0 0  Preparing Food and eating ? N N  Using the Toilet? N N  In the past six months, have you accidently leaked urine? N N  Do you have problems with loss of bowel control? N N  Managing your Medications? N N  Managing your Finances? N N  Housekeeping or managing your Housekeeping? N N    Patient Care Team: Edman Marsa PARAS, DO as PCP - General (Family Medicine) Darliss Rogue, MD as PCP - Cardiology (Cardiology) Edman Marsa PARAS, DO (Family Medicine)  I have updated your Care Teams any recent Medical Services you may have received from other providers in the past year.     Assessment:   This is a routine wellness examination for  Kendra Allison.  Hearing/Vision screen Hearing Screening - Comments:: NO AIDS Vision Screening - Comments:: [facility] [place]    Goals Addressed             This Visit's Progress    DIET - INCREASE WATER INTAKE         Depression Screen     12/31/2023    9:37 AM  10/05/2023    8:59 AM 09/08/2023    2:19 PM 09/08/2023    2:18 PM 05/14/2023    8:59 AM 02/24/2023    4:09 PM 11/11/2022    3:39 PM  PHQ 2/9 Scores  PHQ - 2 Score 0 0 0 0 0 0 0  PHQ- 9 Score 0 6 6  6 6      Fall Risk     12/31/2023    9:42 AM 12/26/2023    4:49 PM 10/05/2023    8:59 AM 09/08/2023    2:18 PM 05/14/2023    8:59 AM  Fall Risk   Falls in the past year? 0 0 0 0 0  Number falls in past yr: 0      Injury with Fall? 0 0     Risk for fall due to : No Fall Risks      Follow up Falls evaluation completed;Falls prevention discussed        MEDICARE RISK AT HOME:  Medicare Risk at Home Any stairs in or around the home?: No If so, are there any without handrails?: No Home free of loose throw rugs in walkways, pet beds, electrical cords, etc?: Yes Adequate lighting in your home to reduce risk of falls?: Yes Life alert?: No Use of a cane, walker or w/c?: No Grab bars in the bathroom?: No Shower chair or bench in shower?: No Elevated toilet seat or a handicapped toilet?: No  TIMED UP AND GO:  Was the test performed?  No  Cognitive Function: 6CIT completed        12/31/2023    9:43 AM 09/10/2022    2:17 PM 09/08/2021    8:12 AM  6CIT Screen  What Year? 0 points 0 points 0 points  What month? 0 points 0 points 0 points  What time? 0 points 0 points 0 points  Count back from 20 0 points 0 points 0 points  Months in reverse 0 points 0 points 0 points  Repeat phrase 2 points 0 points 0 points  Total Score 2 points 0 points 0 points    Immunizations Immunization History  Administered Date(s) Administered   Fluad Quad(high Dose 65+) 03/19/2022   Fluad Trivalent(High Dose 65+)  02/24/2023   Influenza,inj,Quad PF,6+ Mos 03/01/2019, 03/04/2020, 02/13/2021   Moderna Covid-19 Vaccine Bivalent Booster 27yrs & up 03/27/2021   PFIZER Comirnaty(Gray Top)Covid-19 Tri-Sucrose Vaccine 06/13/2020   PFIZER(Purple Top)SARS-COV-2 Vaccination 08/23/2019, 09/20/2019   PNEUMOCOCCAL CONJUGATE-20 09/08/2021   Tdap 04/07/2020   Zoster Recombinant(Shingrix) 03/27/2021, 05/29/2021    Screening Tests Health Maintenance  Topic Date Due   COVID-19 Vaccine (5 - 2024-25 season) 01/24/2023   INFLUENZA VACCINE  12/24/2023   MAMMOGRAM  06/28/2024   Medicare Annual Wellness (AWV)  12/30/2024   Colonoscopy  02/11/2027   DEXA SCAN  07/15/2027   DTaP/Tdap/Td (2 - Td or Tdap) 04/07/2030   Pneumococcal Vaccine: 50+ Years  Completed   Hepatitis C Screening  Completed   Zoster Vaccines- Shingrix  Completed   Hepatitis B Vaccines  Aged Out   HPV VACCINES  Aged Out   Meningococcal B Vaccine  Aged Out    Health Maintenance  Health Maintenance Due  Topic Date Due   COVID-19 Vaccine (5 - 2024-25 season) 01/24/2023   INFLUENZA VACCINE  12/24/2023   Health Maintenance Items Addressed: UP TO DATE ON MAMMOGRAM, COLONOSCOPY & BDS; UP TO DATE ON TDAP, PNA, SHINGRIX- NEEDS COVID  Additional Screening:  Vision Screening: Recommended annual ophthalmology exams for early  detection of glaucoma and other disorders of the eye. Would you like a referral to an eye doctor? No    Dental Screening: Recommended annual dental exams for proper oral hygiene  Community Resource Referral / Chronic Care Management: CRR required this visit?  No   CCM required this visit?  No   Plan:    I have personally reviewed and noted the following in the patient's chart:   Medical and social history Use of alcohol, tobacco or illicit drugs  Current medications and supplements including opioid prescriptions. Patient is not currently taking opioid prescriptions. Functional ability and status Nutritional  status Physical activity Advanced directives List of other physicians Hospitalizations, surgeries, and ER visits in previous 12 months Vitals Screenings to include cognitive, depression, and falls Referrals and appointments  In addition, I have reviewed and discussed with patient certain preventive protocols, quality metrics, and best practice recommendations. A written personalized care plan for preventive services as well as general preventive health recommendations were provided to patient.   Jhonnie GORMAN Das, LPN   05/25/7972   After Visit Summary: (MyChart) Due to this being a telephonic visit, the after visit summary with patients personalized plan was offered to patient via MyChart   Notes: Nothing significant to report at this time.

## 2023-12-31 NOTE — Patient Instructions (Signed)
 Kendra Allison , Thank you for taking time out of your busy schedule to complete your Annual Wellness Visit with me. I enjoyed our conversation and look forward to speaking with you again next year. I, as well as your care team,  appreciate your ongoing commitment to your health goals. Please review the following plan we discussed and let me know if I can assist you in the future.   Follow up Visits: 01/12/25 @ 12;40 pm by phone We will see or speak with you next year for your Next Medicare AWV with our clinical staff Have you seen your provider in the last 6 months (3 months if uncontrolled diabetes)? Yes  Clinician Recommendations:  Aim for 30 minutes of exercise or brisk walking, 6-8 glasses of water, and 5 servings of fruits and vegetables each day. TAKE CARE!      This is a list of the screenings recommended for you:  Health Maintenance  Topic Date Due   COVID-19 Vaccine (5 - 2024-25 season) 01/24/2023   Flu Shot  12/24/2023   Mammogram  06/28/2024   Medicare Annual Wellness Visit  12/30/2024   Colon Cancer Screening  02/11/2027   DEXA scan (bone density measurement)  07/15/2027   DTaP/Tdap/Td vaccine (2 - Td or Tdap) 04/07/2030   Pneumococcal Vaccine for age over 34  Completed   Hepatitis C Screening  Completed   Zoster (Shingles) Vaccine  Completed   Hepatitis B Vaccine  Aged Out   HPV Vaccine  Aged Out   Meningitis B Vaccine  Aged Out    Advanced directives: (ACP Link)Information on Advanced Care Planning can be found at [place]  Print production planner Health Care Directives Advance Health Care Directives. [email or web address]  Advance Care Planning is important because it:  [x]  Makes sure you receive the medical care that is consistent with your values, goals, and preferences  [x]  It provides guidance to your family and loved ones and reduces their decisional burden about whether or not they are making the right decisions based on your wishes.  Follow the link provided in your  after visit summary or read over the paperwork we have mailed to you to help you started getting your Advance Directives in place. If you need assistance in completing these, please reach out to us  so that we can help you!

## 2024-01-07 ENCOUNTER — Other Ambulatory Visit: Payer: Self-pay | Admitting: Family Medicine

## 2024-01-07 ENCOUNTER — Ambulatory Visit: Payer: Self-pay

## 2024-01-07 DIAGNOSIS — F5104 Psychophysiologic insomnia: Secondary | ICD-10-CM

## 2024-01-07 MED ORDER — MIRTAZAPINE 7.5 MG PO TABS: 7.5000 mg | ORAL_TABLET | Freq: Every day | ORAL | 1 refills | Status: AC

## 2024-01-07 NOTE — Telephone Encounter (Signed)
 FYI Only or Action Required?: Action required by provider: update on patient condition.  Patient was last seen in primary care on 10/05/2023 by Edman Marsa PARAS, DO.  Called Nurse Triage reporting Medication Problem.  Symptoms began several weeks ago.  Interventions attempted: Other: stopped taking her trazodone  for the past 3 days.  Symptoms jmz:ipsspwzdd and anxiety are completely resolved. Still having insomnia (describes it as falling asleep and waking up within a few hours; difficulty staying asleep)  Triage Disposition: Call PCP When Office is Open  Patient/caregiver understands and will follow disposition?: Yes                   Message from [place] S sent at 01/07/2024  8:38 AM EDT  Patient would like to let Dr. Edman know that her traZODone  (DESYREL ) 50 MG tablet has expired and she would like to try something else due to it causing her to feel dizzy and nervous after waking up. She would like a 30 day supply of Zolpidem  (Ambien ) sent to her preferred pharmacy.  Callback #: [phone]  Pharmacy: [facility] - [place], [place] - [address] [place] [address] Phone: [phone] Fax: [phone] Hours: Not open 24 hours   Reason for Disposition  [1] Caller has NON-URGENT medicine question about med that PCP prescribed AND [2] triager unable to answer question  Answer Assessment - Initial Assessment Questions 1. NAME of MEDICINE: What medicine(s) are you calling about?     Trazodone .  2. QUESTION: What is your question? (e.g., double dose of medicine, side effect)     Patient would like to know if she can switch to Zolpidem  (Ambien ) or anything he could recommend for a 30 day supply to try first and then if that works he can send in another prescription.  3. PRESCRIBER: Who prescribed the medicine? Reason: if prescribed by specialist, call should be referred to that group.     Dr  Edman.  4. SYMPTOMS: Do you have any symptoms? If Yes, ask: What symptoms are you having?  How bad are the symptoms (e.g., mild, moderate, severe)     She states she was taking the medication at 8pm and going to bed around 9pm. She states she was waking up with dizziness and anxiety/nervousness. She states she is not having any dizziness/anxiety at this time. She states she stopped the trazodone  and has not taken it in 3 days.  5. PREGNANCY:  Is there any chance that you are pregnant? When was your last menstrual period?     N/A.  Protocols used: Medication Question Call-A-AH

## 2024-01-07 NOTE — Telephone Encounter (Signed)
 According to Dr. Edman last note 09/2023, he would consider mirtazapine  in place of trazodone .  This will not only aid in sleep but also an appetite stimulation.  Would she be agreeable with this?

## 2024-01-07 NOTE — Telephone Encounter (Signed)
 Signed in to complete a chart and review messages.  Thank you. I will go ahead and send Rx Mirtazapine  7.5mg  nightly 90 day to [facility] for her.  Marsa Officer, DO [facility] 01/07/2024, 3:10 PM

## 2024-01-07 NOTE — Telephone Encounter (Signed)
 Spoke with patient she is agreeable to starting mirtazapine . Can we call that into [facility]

## 2024-01-09 ENCOUNTER — Other Ambulatory Visit: Payer: Self-pay | Admitting: Family Medicine

## 2024-01-09 DIAGNOSIS — K219 Gastro-esophageal reflux disease without esophagitis: Secondary | ICD-10-CM

## 2024-01-10 DIAGNOSIS — D492 Neoplasm of unspecified behavior of bone, soft tissue, and skin: Secondary | ICD-10-CM | POA: Diagnosis not present

## 2024-01-10 DIAGNOSIS — L720 Epidermal cyst: Secondary | ICD-10-CM | POA: Diagnosis not present

## 2024-01-11 NOTE — Telephone Encounter (Signed)
 Requested Prescriptions  Pending Prescriptions Disp Refills   esomeprazole  (NEXIUM ) 40 MG capsule [Pharmacy Med Name: ESOMEPRAZOLE  MAGNESIUM  40 MG Oral Capsule Delayed Release] 90 capsule 2    Sig: TAKE 1 CAPSULE EVERY DAY BEFORE BREAKFAST     Gastroenterology: Proton Pump Inhibitors 2 Passed - 01/11/2024  5:13 PM      Passed - ALT in normal range and within 360 days    ALT  Date Value Ref Range Status  10/05/2023 15 6 - 29 U/L Final         Passed - AST in normal range and within 360 days    AST  Date Value Ref Range Status  10/05/2023 20 10 - 35 U/L Final         Passed - Valid encounter within last 12 months    Recent Outpatient Visits           3 months ago Annual physical exam    [facility] [place], Marsa PARAS, DO   4 months ago COVID-19   [facility] [place], Marsa PARAS, [place]

## 2024-02-10 DIAGNOSIS — L6681 Central centrifugal cicatricial alopecia: Secondary | ICD-10-CM | POA: Diagnosis not present

## 2024-02-10 DIAGNOSIS — L810 Postinflammatory hyperpigmentation: Secondary | ICD-10-CM | POA: Diagnosis not present

## 2024-03-03 ENCOUNTER — Telehealth: Payer: Self-pay

## 2024-03-03 NOTE — Telephone Encounter (Signed)
 Copied from CRM [id number]. Topic: Clinical - Lab/Test Results >> Mar 03, 2024 11:19 AM [place] B wrote: Reason for CRM: Patient wants to have basic panel blood work done

## 2024-03-06 NOTE — Telephone Encounter (Signed)
 Spoke with patient, appointment made to see Dr Edman to discuss blood work and get flu vaccine

## 2024-03-06 NOTE — Telephone Encounter (Signed)
 Could you contact her for more information on what she is looking for? Her last annual physical was 09/2023 and she had a full blood panel, we typically do lab panels once per year. Or sooner if there is a specific need.  Might need more information on what she is looking to accomplish? We can consider a lab test without an apt if she just wants to know, or we can see her first and order whatever she needs based on that visit.  Marsa Officer, DO [facility] 03/06/2024, 1:04 PM

## 2024-03-07 ENCOUNTER — Other Ambulatory Visit: Payer: Self-pay | Admitting: Family Medicine

## 2024-03-07 DIAGNOSIS — I10 Essential (primary) hypertension: Secondary | ICD-10-CM | POA: Diagnosis not present

## 2024-03-07 DIAGNOSIS — J9801 Acute bronchospasm: Secondary | ICD-10-CM

## 2024-03-07 DIAGNOSIS — I471 Supraventricular tachycardia, unspecified: Secondary | ICD-10-CM | POA: Diagnosis not present

## 2024-03-07 DIAGNOSIS — E785 Hyperlipidemia, unspecified: Secondary | ICD-10-CM | POA: Diagnosis not present

## 2024-03-07 DIAGNOSIS — E663 Overweight: Secondary | ICD-10-CM | POA: Diagnosis not present

## 2024-03-09 NOTE — Telephone Encounter (Signed)
 Requested Prescriptions  Pending Prescriptions Disp Refills   albuterol  (VENTOLIN  HFA) 108 (90 Base) MCG/ACT inhaler [Pharmacy Med Name: ALBUTEROL  SULFATE HFA 108 (90 Base) MCG/ACT Inhalation Aerosol Solution] 18 g 0    Sig: INHALE 2 PUFFS EVERY 4 HOURS AS NEEDED FOR WHEEZING OR SHORTNESS OF BREATH (COUGH)     Pulmonology:  Beta Agonists 2 Passed - 03/09/2024  2:42 PM      Passed - Last BP in normal range    BP Readings from Last 1 Encounters:  10/05/23 132/84         Passed - Last Heart Rate in normal range    Pulse Readings from Last 1 Encounters:  10/05/23 73         Passed - Valid encounter within last 12 months    Recent Outpatient Visits           5 months ago Annual physical exam   [facility] [place], Marsa PARAS, DO   6 months ago COVID-19   [facility] [place], Marsa PARAS, [place]

## 2024-03-21 ENCOUNTER — Ambulatory Visit

## 2024-03-22 ENCOUNTER — Ambulatory Visit: Admitting: Family Medicine

## 2024-03-22 ENCOUNTER — Encounter: Payer: Self-pay | Admitting: Family Medicine

## 2024-03-22 VITALS — BP 137/84 | HR 59 | Ht 66.0 in | Wt 176.0 lb

## 2024-03-22 DIAGNOSIS — I10 Essential (primary) hypertension: Secondary | ICD-10-CM | POA: Diagnosis not present

## 2024-03-22 DIAGNOSIS — F5104 Psychophysiologic insomnia: Secondary | ICD-10-CM | POA: Diagnosis not present

## 2024-03-22 DIAGNOSIS — R350 Frequency of micturition: Secondary | ICD-10-CM | POA: Diagnosis not present

## 2024-03-22 DIAGNOSIS — E7800 Pure hypercholesterolemia, unspecified: Secondary | ICD-10-CM

## 2024-03-22 DIAGNOSIS — Z23 Encounter for immunization: Secondary | ICD-10-CM | POA: Diagnosis not present

## 2024-03-22 MED ORDER — BELSOMRA 10 MG PO TABS
10.0000 mg | ORAL_TABLET | Freq: Every evening | ORAL | 2 refills | Status: AC | PRN
Start: 2024-03-22 — End: ?

## 2024-03-22 NOTE — Patient Instructions (Addendum)
 Thank you for coming to the office today.  Labs today  Urinalysis and Urine Culture If shows bacteria we can treat it, and let you know  If the neck symptoms continue we can consider Neck Ultrasound or possible swallow study  Keep up with Neurology for the sleep study  Try the new sleeping pill, 30 min before bed, Belomra generic / brand, 10mg  nightly as needed, you can skip night if you prefer, or take it regularly. If too strong, next time can take HALF pill instead or if not strong enough, can take 2.    Please schedule a Follow-up Appointment to: Return if symptoms worsen or fail to improve.  If you have any other questions or concerns, please feel free to call the office or send a message through MyChart. You may also schedule an earlier appointment if necessary.  Additionally, you may be receiving a survey about your experience at our office within a few days to 1 week by e-mail or mail. We value your feedback.  Marsa Officer, DO [facility], [place]

## 2024-03-22 NOTE — Progress Notes (Signed)
 Subjective:    Patient ID: Kendra Allison, female    DOB: April 17, 1956, 68 y.o.   MRN: [id number]  Kendra Allison is a 68 y.o. female presenting on 03/22/2024 for Medical Management of Chronic Issues   HPI  Discussed the use of AI scribe software for clinical note transcription with the patient, who gave verbal consent to proceed.  History of Present Illness   Kendra Allison is a 68 year old female who presents with urinary symptoms and sleep disturbances.  Lower urinary tract symptoms - Frequent urination and urinary pain began after a recent cardiology visit - Increased fluid intake and cranberry juice consumption for over a week led to improvement - Frequent urination has resolved - Mild urinary pain persists  Insomnia / Sleep disturbance - Difficulty initiating sleep - Wakes every two hours during the night - Trials of trazodone , zolpidem , melatonin, and mirtazapine  with limited benefit - Awaiting neurology evaluation  Neck Symptoms - Throbbing pain in the neck, sometimes noticeable with swallowing or neck movement - No associated soreness or burning sensation - Pain quality is different from prior episodes of acid reflux  Cardiovascular history and hyperlipidemia management - History of supraventricular tachycardia - Currently taking pravastatin 80 mg for cholesterol management - Pravastatin dose previously increased from 10 mg to 20 mg - Cholesterol levels were reportedly very good in May         03/22/2024   10:02 AM 03/22/2024   10:01 AM 12/31/2023    9:37 AM  Depression screen PHQ 2/9  Decreased Interest 0 0 0  Down, Depressed, Hopeless 0 0 0  PHQ - 2 Score 0 0 0  Altered sleeping 1  0  Tired, decreased energy 0  0  Change in appetite   0  Feeling bad or failure about yourself  0  0  Trouble concentrating 0  0  Moving slowly or fidgety/restless 0  0  Suicidal thoughts 0  0  PHQ-9 Score 1  0  Difficult doing work/chores Not difficult at all  Not  difficult at all       03/22/2024   10:02 AM 10/05/2023    8:59 AM 09/08/2023    2:19 PM 05/14/2023    8:59 AM  GAD 7 : Generalized Anxiety Score  Nervous, Anxious, on Edge 0 0 0 0  Control/stop worrying 0 0 0 0  Worry too much - different things 0 0 0 0  Trouble relaxing 0 0 0 0  Restless 0 0 0 0  Easily annoyed or irritable 0 0 0 0  Afraid - awful might happen 0 0 0 0  Total GAD 7 Score 0 0 0 0  Anxiety Difficulty  Not difficult at all Not difficult at all     Social History   Tobacco Use   Smoking status: Never   Smokeless tobacco: Never   Tobacco comments:    Quit 40years ago.   Vaping Use   Vaping status: Never Used  Substance Use Topics   Alcohol use: Yes    Comment: rarely    Drug use: Never    Review of Systems Per HPI unless specifically indicated above     Objective:    BP 137/84 (BP Location: Left Arm, Cuff Size: Normal)   Pulse (!) 59   Ht 5' 6 (1.676 m)   Wt 176 lb (79.8 kg)   SpO2 97%   BMI 28.41 kg/m   Wt Readings from Last 3 Encounters:  03/22/24 176 lb (79.8  kg)  10/05/23 171 lb (77.6 kg)  09/08/23 175 lb (79.4 kg)    Physical Exam Vitals and nursing note reviewed.  Constitutional:      General: She is not in acute distress.    Appearance: She is well-developed. She is not diaphoretic.     Comments: Well-appearing, comfortable, cooperative  HENT:     Head: Normocephalic and atraumatic.  Eyes:     General:        Right eye: No discharge.        Left eye: No discharge.     Conjunctiva/sclera: Conjunctivae normal.  Neck:     Thyroid : No thyromegaly.  Cardiovascular:     Rate and Rhythm: Normal rate and regular rhythm.     Heart sounds: Normal heart sounds. No murmur heard. Pulmonary:     Effort: Pulmonary effort is normal. No respiratory distress.     Breath sounds: Normal breath sounds. No wheezing or rales.  Musculoskeletal:        General: Normal range of motion.     Cervical back: Normal range of motion and neck supple.   Lymphadenopathy:     Cervical: No cervical adenopathy.  Skin:    General: Skin is warm and dry.     Findings: No erythema or rash.  Neurological:     Mental Status: She is alert and oriented to person, place, and time.  Psychiatric:        Behavior: Behavior normal.     Comments: Well groomed, good eye contact, normal speech and thoughts     Results for orders placed or performed in visit on 10/05/23  TSH   Collection Time: 10/05/23  8:55 AM  Result Value Ref Range   TSH 1.51 0.40 - 4.50 mIU/L  Lipid panel   Collection Time: 10/05/23  8:55 AM  Result Value Ref Range   Cholesterol 127 <200 mg/dL   HDL 56 > OR = 50 mg/dL   Triglycerides 84 <849 mg/dL   LDL Cholesterol (Calc) 55 mg/dL (calc)   Total CHOL/HDL Ratio 2.3 <5.0 (calc)   Non-HDL Cholesterol (Calc) 71 <869 mg/dL (calc)  Hemoglobin J8r   Collection Time: 10/05/23  8:55 AM  Result Value Ref Range   Hgb A1c MFr Bld 5.5 <5.7 %   Mean Plasma Glucose 111 mg/dL   eAG (mmol/L) 6.2 mmol/L  CBC with Differential/Platelet   Collection Time: 10/05/23  8:55 AM  Result Value Ref Range   WBC 3.9 3.8 - 10.8 Thousand/uL   RBC 4.51 3.80 - 5.10 Million/uL   Hemoglobin 13.3 11.7 - 15.5 g/dL   HCT 59.1 64.9 - 54.9 %   MCV 90.5 80.0 - 100.0 fL   MCH 29.5 27.0 - 33.0 pg   MCHC 32.6 32.0 - 36.0 g/dL   RDW 87.4 88.9 - 84.9 %   Platelets 334 140 - 400 Thousand/uL   MPV 10.0 7.5 - 12.5 fL   Neutro Abs 1,907 1,500 - 7,800 cells/uL   Absolute Lymphocytes 1,502 850 - 3,900 cells/uL   Absolute Monocytes 351 200 - 950 cells/uL   Eosinophils Absolute 101 15 - 500 cells/uL   Basophils Absolute 39 0 - 200 cells/uL   Neutrophils Relative % 48.9 %   Total Lymphocyte 38.5 %   Monocytes Relative 9.0 %   Eosinophils Relative 2.6 %   Basophils Relative 1.0 %  Comprehensive metabolic panel with GFR   Collection Time: 10/05/23  8:55 AM  Result Value Ref Range   Glucose, Bld 93 65 -  99 mg/dL   BUN 16 7 - 25 mg/dL   Creat 9.00 9.49 - 8.94  mg/dL   eGFR 62 > OR = 60 fO/fpw/8.26f7   BUN/Creatinine Ratio SEE NOTE: 6 - 22 (calc)   Sodium 141 135 - 146 mmol/L   Potassium 4.0 3.5 - 5.3 mmol/L   Chloride 103 98 - 110 mmol/L   CO2 30 20 - 32 mmol/L   Calcium  10.1 8.6 - 10.4 mg/dL   Total Protein 7.2 6.1 - 8.1 g/dL   Albumin 4.8 3.6 - 5.1 g/dL   Globulin 2.4 1.9 - 3.7 g/dL (calc)   AG Ratio 2.0 1.0 - 2.5 (calc)   Total Bilirubin 0.5 0.2 - 1.2 mg/dL   Alkaline phosphatase (APISO) 63 37 - 153 U/L   AST 20 10 - 35 U/L   ALT 15 6 - 29 U/L  T4, free   Collection Time: 10/05/23  8:55 AM  Result Value Ref Range   Free T4 1.1 0.8 - 1.8 ng/dL  Vitamin B12   Collection Time: 10/05/23  8:55 AM  Result Value Ref Range   Vitamin B-12 1,566 (H) 200 - 1,100 pg/mL  VITAMIN D  25 Hydroxy (Vit-D Deficiency, Fractures)   Collection Time: 10/05/23  8:55 AM  Result Value Ref Range   Vit D, 25-Hydroxy 47 30 - 100 ng/mL      Assessment & Plan:   Problem List Items Addressed This Visit     Essential hypertension   Relevant Orders   Comprehensive metabolic panel with GFR   TSH   T4, free   Hyperlipidemia - Primary   Relevant Orders   Lipid panel   Comprehensive metabolic panel with GFR   TSH   T4, free   Other Visit Diagnoses       Flu vaccine need       Relevant Orders   Flu vaccine HIGH DOSE PF(Fluzone Trivalent) (Completed)     Psychophysiological insomnia       Relevant Medications   Suvorexant (BELSOMRA) 10 MG TABS   Other Relevant Orders   TSH   T4, free     Urinary frequency       Relevant Orders   Urinalysis, Routine w reflex microscopic   Urine Culture        Insomnia Chronic insomnia with ineffective trials of multiple medications. - Prescribed Belsomra 10 mg nightly as needed. Adjust dose based on response. - Follow up with neurology for sleep study in January.  Neck pain, unspecified Intermittent neck pain with no clear etiology. - Order thyroid  function tests. - Consider neck ultrasound or swallow  study if symptoms persist.  Urinary frequency Recent improvement with increased fluid intake and cranberry juice. Mild residual pain persists. - Order urine culture to rule out infection. Defer antibiotic until review results  Hypercholesterolemia On pravastatin 80 mg. Cardiologist considering dose reduction pending cholesterol test results. - Order cholesterol panel to assess current levels.        Orders Placed This Encounter  Procedures   Urine Culture   Flu vaccine HIGH DOSE PF(Fluzone Trivalent)   Lipid panel    Has the patient fasted?:   Yes   Comprehensive metabolic panel with GFR    Has the patient fasted?:   Yes   TSH   T4, free   Urinalysis, Routine w reflex microscopic    Meds ordered this encounter  Medications   Suvorexant (BELSOMRA) 10 MG TABS    Sig: Take 1 tablet (10 mg total) by  mouth at bedtime as needed.    Dispense:  30 tablet    Refill:  2    Follow up plan: Return if symptoms worsen or fail to improve.   Marsa Officer, DO [facility] 03/22/2024, 10:04 AM

## 2024-03-23 ENCOUNTER — Ambulatory Visit: Payer: Self-pay | Admitting: Family Medicine

## 2024-03-23 DIAGNOSIS — L6681 Central centrifugal cicatricial alopecia: Secondary | ICD-10-CM | POA: Diagnosis not present

## 2024-03-23 LAB — LIPID PANEL
Cholesterol: 147 mg/dL (ref <200)
HDL: 62 mg/dL (ref >=50)
LDL Cholesterol (Calc): 71 mg/dL
Non-HDL Cholesterol (Calc): 85 mg/dL (ref <130)
Total CHOL/HDL Ratio: 2.4 (calc) (ref <5.0)
Triglycerides: 60 mg/dL (ref <150)

## 2024-03-23 LAB — URINALYSIS, ROUTINE W REFLEX MICROSCOPIC
Bilirubin Urine: NEGATIVE
Glucose, UA: NEGATIVE
Hgb urine dipstick: NEGATIVE
Ketones, ur: NEGATIVE
Leukocytes,Ua: NEGATIVE
Nitrite: NEGATIVE
Protein, ur: NEGATIVE
Specific Gravity, Urine: 1.011 (ref 1.001–1.035)
pH: 7.5 (ref 5.0–8.0)

## 2024-03-23 LAB — COMPREHENSIVE METABOLIC PANEL WITH GFR
AG Ratio: 2.1 (calc) (ref 1.0–2.5)
ALT: 20 U/L (ref 6–29)
AST: 21 U/L (ref 10–35)
Albumin: 5 g/dL (ref 3.6–5.1)
Alkaline phosphatase (APISO): 69 U/L (ref 37–153)
BUN: 16 mg/dL (ref 7–25)
CO2: 29 mmol/L (ref 20–32)
Calcium: 10.6 mg/dL — ABNORMAL HIGH (ref 8.6–10.4)
Chloride: 103 mmol/L (ref 98–110)
Creat: 0.98 mg/dL (ref 0.50–1.05)
Globulin: 2.4 g/dL (ref 1.9–3.7)
Glucose, Bld: 93 mg/dL (ref 65–99)
Potassium: 4.1 mmol/L (ref 3.5–5.3)
Sodium: 142 mmol/L (ref 135–146)
Total Bilirubin: 0.6 mg/dL (ref 0.2–1.2)
Total Protein: 7.4 g/dL (ref 6.1–8.1)
eGFR: 63 mL/min/1.73m2 (ref >=60)

## 2024-03-23 LAB — T4, FREE: Free T4: 1.1 ng/dL (ref 0.8–1.8)

## 2024-03-23 LAB — TSH: TSH: 1.47 m[IU]/L (ref 0.40–4.50)

## 2024-03-23 LAB — URINE CULTURE
MICRO NUMBER:: 17164533
Result:: NO GROWTH
SPECIMEN QUALITY:: ADEQUATE

## 2024-05-21 ENCOUNTER — Other Ambulatory Visit: Payer: Self-pay | Admitting: Family Medicine

## 2024-05-21 DIAGNOSIS — M19172 Post-traumatic osteoarthritis, left ankle and foot: Secondary | ICD-10-CM

## 2024-05-22 ENCOUNTER — Other Ambulatory Visit: Payer: Self-pay | Admitting: Neurology

## 2024-05-22 DIAGNOSIS — R4189 Other symptoms and signs involving cognitive functions and awareness: Secondary | ICD-10-CM

## 2024-05-23 NOTE — Telephone Encounter (Signed)
 Requested medication (s) are due for refill today: Yes  Requested medication (s) are on the active medication list: Yes  Last refill:  10/05/23  Future visit scheduled:   Notes to clinic:  Manual review.    Requested Prescriptions  Pending Prescriptions Disp Refills   celecoxib  (CELEBREX ) 200 MG capsule [Pharmacy Med Name: CELECOXIB  200 MG Oral Capsule] 60 capsule 11    Sig: TAKE 1 CAPSULE TWICE DAILY AS NEEDED     Analgesics:  COX2 Inhibitors Failed - 05/23/2024  1:56 PM      Failed - Manual Review: Labs are only required if the patient has taken medication for more than 8 weeks.      Passed - HGB in normal range and within 360 days    Hemoglobin  Date Value Ref Range Status  10/05/2023 13.3 11.7 - 15.5 g/dL Final         Passed - Cr in normal range and within 360 days    Creat  Date Value Ref Range Status  03/22/2024 0.98 0.50 - 1.05 mg/dL Final         Passed - HCT in normal range and within 360 days    HCT  Date Value Ref Range Status  10/05/2023 40.8 35.0 - 45.0 % Final         Passed - AST in normal range and within 360 days    AST  Date Value Ref Range Status  03/22/2024 21 10 - 35 U/L Final         Passed - ALT in normal range and within 360 days    ALT  Date Value Ref Range Status  03/22/2024 20 6 - 29 U/L Final         Passed - eGFR is 30 or above and within 360 days    GFR, Est African American  Date Value Ref Range Status  08/21/2020 83 > OR = 60 mL/min/1.62m2 Final   GFR, Est Non African American  Date Value Ref Range Status  08/21/2020 71 > OR = 60 mL/min/1.52m2 Final   eGFR  Date Value Ref Range Status  03/22/2024 63 > OR = 60 mL/min/1.62m2 Final         Passed - Patient is not pregnant      Passed - Valid encounter within last 12 months    Recent Outpatient Visits           2 months ago Pure hypercholesterolemia   [place] [facility] [place], Marsa PARAS, DO   7 months ago Annual physical exam   [facility] [place] Marsa PARAS, DO   8 months ago COVID-19   [facility] [employer], Marsa PARAS, [place]

## 2024-05-24 ENCOUNTER — Ambulatory Visit: Admission: RE | Admit: 2024-05-24 | Discharge: 2024-05-24 | Attending: Neurology | Admitting: Neurology

## 2024-05-24 DIAGNOSIS — R4189 Other symptoms and signs involving cognitive functions and awareness: Secondary | ICD-10-CM | POA: Insufficient documentation

## 2025-01-12 ENCOUNTER — Ambulatory Visit

## 2025-01-17 ENCOUNTER — Ambulatory Visit
